# Patient Record
Sex: Female | Born: 1935
Health system: Southern US, Community
[De-identification: ages and names within clinical notes are randomized; demographics above are authoritative.]

## PROBLEM LIST (undated history)

## (undated) DIAGNOSIS — E785 Hyperlipidemia, unspecified: Secondary | ICD-10-CM

## (undated) DIAGNOSIS — I872 Venous insufficiency (chronic) (peripheral): Secondary | ICD-10-CM

## (undated) DIAGNOSIS — H3530 Unspecified macular degeneration: Secondary | ICD-10-CM

## (undated) DIAGNOSIS — H409 Unspecified glaucoma: Secondary | ICD-10-CM

## (undated) DIAGNOSIS — I10 Essential (primary) hypertension: Secondary | ICD-10-CM

## (undated) DIAGNOSIS — G629 Polyneuropathy, unspecified: Secondary | ICD-10-CM

## (undated) DIAGNOSIS — F0390 Unspecified dementia without behavioral disturbance: Secondary | ICD-10-CM

## (undated) HISTORY — PX: EYE SURGERY: SHX253

## (undated) HISTORY — PX: CHOLECYSTECTOMY: SHX55

## (undated) HISTORY — PX: HEMICOLECTOMY: SHX854

---

## 1998-06-05 ENCOUNTER — Inpatient Hospital Stay (HOSPITAL_COMMUNITY): Admission: EM | Admit: 1998-06-05 | Discharge: 1998-06-05 | Payer: Self-pay

## 1998-06-05 ENCOUNTER — Encounter: Payer: Self-pay | Admitting: Urology

## 1998-06-19 ENCOUNTER — Inpatient Hospital Stay (HOSPITAL_COMMUNITY): Admission: EM | Admit: 1998-06-19 | Discharge: 1998-06-20 | Payer: Self-pay | Admitting: Internal Medicine

## 1998-06-19 ENCOUNTER — Encounter: Payer: Self-pay | Admitting: Urology

## 1998-06-20 ENCOUNTER — Encounter: Payer: Self-pay | Admitting: Urology

## 1998-06-27 ENCOUNTER — Encounter: Payer: Self-pay | Admitting: Urology

## 1998-06-27 ENCOUNTER — Ambulatory Visit (HOSPITAL_COMMUNITY): Admission: RE | Admit: 1998-06-27 | Discharge: 1998-06-27 | Payer: Self-pay | Admitting: Urology

## 1998-09-22 ENCOUNTER — Emergency Department (HOSPITAL_COMMUNITY): Admission: EM | Admit: 1998-09-22 | Discharge: 1998-09-22 | Payer: Self-pay

## 1999-08-25 ENCOUNTER — Encounter: Payer: Self-pay | Admitting: Orthopedic Surgery

## 1999-08-25 ENCOUNTER — Encounter: Admission: RE | Admit: 1999-08-25 | Discharge: 1999-08-25 | Payer: Self-pay | Admitting: Orthopedic Surgery

## 1999-08-26 ENCOUNTER — Ambulatory Visit (HOSPITAL_BASED_OUTPATIENT_CLINIC_OR_DEPARTMENT_OTHER): Admission: RE | Admit: 1999-08-26 | Discharge: 1999-08-26 | Payer: Self-pay | Admitting: Orthopedic Surgery

## 1999-11-06 ENCOUNTER — Encounter: Admission: RE | Admit: 1999-11-06 | Discharge: 1999-11-06 | Payer: Self-pay | Admitting: Obstetrics and Gynecology

## 1999-11-06 ENCOUNTER — Encounter: Payer: Self-pay | Admitting: Obstetrics and Gynecology

## 2000-02-23 ENCOUNTER — Ambulatory Visit (HOSPITAL_COMMUNITY): Admission: RE | Admit: 2000-02-23 | Discharge: 2000-02-23 | Payer: Self-pay

## 2002-02-02 ENCOUNTER — Emergency Department (HOSPITAL_COMMUNITY): Admission: EM | Admit: 2002-02-02 | Discharge: 2002-02-02 | Payer: Self-pay | Admitting: Emergency Medicine

## 2002-03-06 ENCOUNTER — Encounter: Payer: Self-pay | Admitting: Emergency Medicine

## 2002-03-06 ENCOUNTER — Inpatient Hospital Stay (HOSPITAL_COMMUNITY): Admission: EM | Admit: 2002-03-06 | Discharge: 2002-03-13 | Payer: Self-pay | Admitting: Emergency Medicine

## 2004-10-13 ENCOUNTER — Encounter: Admission: RE | Admit: 2004-10-13 | Discharge: 2004-10-13 | Payer: Self-pay

## 2005-02-10 ENCOUNTER — Ambulatory Visit (HOSPITAL_COMMUNITY): Admission: RE | Admit: 2005-02-10 | Discharge: 2005-02-10 | Payer: Self-pay | Admitting: Gastroenterology

## 2005-04-20 ENCOUNTER — Encounter: Admission: RE | Admit: 2005-04-20 | Discharge: 2005-04-20 | Payer: Self-pay | Admitting: Surgery

## 2005-05-05 ENCOUNTER — Inpatient Hospital Stay (HOSPITAL_COMMUNITY): Admission: RE | Admit: 2005-05-05 | Discharge: 2005-05-12 | Payer: Self-pay | Admitting: Surgery

## 2005-05-05 ENCOUNTER — Encounter (INDEPENDENT_AMBULATORY_CARE_PROVIDER_SITE_OTHER): Payer: Self-pay | Admitting: Specialist

## 2006-12-06 ENCOUNTER — Encounter: Admission: RE | Admit: 2006-12-06 | Discharge: 2006-12-06 | Payer: Self-pay | Admitting: Internal Medicine

## 2007-07-29 ENCOUNTER — Inpatient Hospital Stay (HOSPITAL_COMMUNITY): Admission: EM | Admit: 2007-07-29 | Discharge: 2007-08-03 | Payer: Self-pay | Admitting: Emergency Medicine

## 2007-07-29 ENCOUNTER — Ambulatory Visit: Payer: Self-pay | Admitting: Cardiology

## 2007-07-29 ENCOUNTER — Encounter (INDEPENDENT_AMBULATORY_CARE_PROVIDER_SITE_OTHER): Payer: Self-pay | Admitting: Internal Medicine

## 2007-08-01 ENCOUNTER — Ambulatory Visit: Payer: Self-pay | Admitting: Physical Medicine & Rehabilitation

## 2009-08-23 ENCOUNTER — Encounter: Admission: RE | Admit: 2009-08-23 | Discharge: 2009-08-23 | Payer: Self-pay | Admitting: Internal Medicine

## 2009-09-09 ENCOUNTER — Encounter: Payer: Self-pay | Admitting: Internal Medicine

## 2009-09-26 ENCOUNTER — Encounter: Admission: RE | Admit: 2009-09-26 | Discharge: 2009-09-26 | Payer: Self-pay | Admitting: Internal Medicine

## 2010-03-22 ENCOUNTER — Encounter: Payer: Self-pay | Admitting: Internal Medicine

## 2010-07-15 NOTE — Discharge Summary (Signed)
NAMEMIRELA, PARSLEY                  ACCOUNT NO.:  [id number]   MEDICAL RECORD NO.:  [id number]          PATIENT TYPE:  INP   LOCATION:  1613                         FACILITY:  [facility]   PHYSICIAN:  Lonia Blood, M.D.       DATE OF BIRTH:  [birth year]   DATE OF ADMISSION:  07/29/2007  DATE OF DISCHARGE:                               DISCHARGE SUMMARY   INTERIM DISCHARGE SUMMARY   DISCHARGE DIAGNOSES:  1. Right humerus fracture.  2. Fracture of the right calcaneus bone with avulsion of the distal      lateral aspect of the calcaneus along the anterior process.  3. Polymyalgia rheumatica on chronic steroids.  4. Peripheral neuropathy of unclear etiology.  5. Hypertension.  6. Status post colonic resection in March 2007 for persistent      diverticulitis.   DISCHARGE MEDICATIONS:  1. Tylenol 100 mg by mouth 3 times a day.  2. Calcium and vitamin D 1 tablet by mouth twice a day.  3. Coreg 3.125 mg by mouth twice a day.  4. Cosopt 1 drop both eyes twice a day.  5. Nu-Iron 160 mg daily.  6. Multivitamin 1 daily.  7. Protonix 40 mg daily.  8. Prednisone 20 mg daily.  9. Altace 10 mg daily.  10.Senokot 1 tablet at bedtime.  11.Vicodin 1-2 tablets as needed every 4 hours for severe pain.  12.Meclizine 12.5 mg by mouth every 8 hours as needed for dizziness.  13.Reglan 5 mg every 6 hours as needed for nausea.   CONDITION ON DISCHARGE:  Mr. Kawahara will be transferred to skilled  nursing facility.   PROCEDURE:  During this admission:  1. Jul 29, 2007, the patient underwent a CT scan of the right shoulder      which was positive for a complex head and neck fracture of the      humerus to the surgical neck region.  2. Jul 29, 2007, CT scan of the right ankle with findings of small      avulsion fracture of the distal lateral calcaneus.   CONSULTATION:  During this admission:  This patient was seen in  consultation by Dr. Turner Daniels from orthopedics.   HISTORY AND PHYSICAL:  Refer to the  dictated H&P done by Dr. Christella Noa on  May 29,009.   HOSPITAL COURSE:  1. Fall and fractures.  Ms. Butler was admitted to [facility].  She was placed in a right upper extremity sling, and the      patient was seen by Dr. Turner Daniels from orthopedic surgery.  Family, the      patient and Dr. Turner Daniels have decided to pursue conservative treatment      for the patient's right femoral fracture.  She will need to follow      up closely with Dr. Turner Daniels in his office to assure that she has got      adequate healing of the arm.  2. Polymyalgia rheumatica on chronic steroids.  We have continued Ms.      Hallum on prednisone on a  daily basis, and we have added a calcium      vitamin D supplement.  She also needs bisphosphatase treatment in      the outpatient setting.  3. Hypertension.  I have resumed the patient's Coreg and ramipril, and      after controlling her pain, she has had a very good response to      this treatment.  4. Pain control.  Ms. Couzens responded very poorly to oral Vicodin and      intravenous Dilaudid.  She became delirious and required switch      back to oral Tylenol.  Tylenol alone seems to be controlling her      pain adequately, and we will continue on this for now.      Lonia Blood, M.D.  Electronically Signed     SL/MEDQ  D:  08/01/2007  T:  08/01/2007  Job:  [id number]   cc:   Feliberto Gottron. Turner Daniels, M.D.  Fax: [phone]   Selena Batten, M.D.

## 2010-07-15 NOTE — H&P (Signed)
Joanne Anderson, Joanne Anderson                  ACCOUNT NO.:  [id number]   MEDICAL RECORD NO.:  [id number]          PATIENT TYPE:  EMS   LOCATION:  ED                           FACILITY:  [facility]   PHYSICIAN:  Herbie Saxon, MDDATE OF BIRTH:  December 24, 1920   DATE OF ADMISSION:  07/29/2007  DATE OF DISCHARGE:                              HISTORY & PHYSICAL   PRIMARY CARE PHYSICIAN:  Dr. Junius Creamer.   ORTHOPEDICS:  Dr. Thomasena Edis.   PRESENTING COMPLAINTS:  Fall 2 hours prior to presentation, pain in the  right shoulder, pain in the right ankle.   HISTORY OF PRESENT ILLNESS:  This is a 77 year old Caucasian lady who  lives at home, who had a fall two hours prior to presenting to the  emergency room at [facility] complaining of severe shoulder  and ankle pain.  The x-ray test showed that she has a proximal right  humeral fracture of the right ankle.  The patient has been experiencing  weakness and dizzy spells at home.  She is on meclizine p.r.n.  She also  has peripheral neuropathy and had difficulty ambulating because of  peripheral neuropathy.  She denies any syncopal episode, any loss of  consciousness.  She has some mild abrasions to the face.  No chest pain.  No palpitation.  No skin rash or pedal swelling.  The patient denies any  dysuria, hematuria or frequency of urination. There is no diarrhea or  constipation.  No cough, shortness of breath or wheezing.   PAST MEDICAL HISTORY:  1. Hypertension for more than 10 years.  2. Peripheral neuropathy more than 5 years.  3. Polymyalgia rheumatica on heparin induced prednisone.   FAMILY HISTORY:  Mother had hypertension.   PAST SURGICAL HISTORY:  1. Colon surgery 2006.  2. Left foot surgery two years ago.  3. Eye surgery times 3.   SOCIAL HISTORY:  She lives alone.  No history of drug, alcohol or  tobacco abuse.   ALLERGIES:  CODEINE.   MEDICATIONS:  1. Prednisone 12 mg daily.  2. Multivitamin one tablet daily.  3. Coreg  3.125 mg b.i.d.  4. Meclizine 12.5 mg q.8 h p.r.n.  5. Ramipril 10 mg daily.   REVIEW OF SYSTEMS:  Pertinent systems all reviewed, pertinent positive  on the history of presenting complaint.   PHYSICAL EXAMINATION:  GENERAL:  She is an elderly lady, not in acute  distress, temperature 98, pulse 86, respirations 16, blood pressure  173/81 .  HEENT:  Pupils equal and reactive to light and accommodation.  Minor  facial abrasion.  Head is normocephalic.  No scar or bruises.  Mucous  membranes moist.  Oropharynx is clear.  NECK:  Supple.  CHEST:  Clinical clear.  HEART:  Sounds 1 and 2 regular rhythm.  ABDOMEN:  Benign.  NEUROLOGICAL:  She is alert and oriented to time, place and person.  EXTREMITIES:  Right upper extremity on right ankle immobilized with  splint.  Peripheral pulses present.  No pedal edema.   LABORATORY DATA:  Labs are pending.  X-ray shows proximal distal  surgical neck fracture of the right proximal humerus.  X-ray of the foot  shows a possible small distal fracture of the navicular.  Soft tissue  swell over the malleolar.   ASSESSMENT:  Severe hypertension, fall, deconditioning, fracture right  humerus, fracture right ankle, polymyalgia rheumatica, peripheral  neuropathy.  The patient is admitted to the medical surgical bed for  orthopedic stability of her fractures.   DISCHARGE MEDICATIONS:  1. Phenergan 12.5 mg IV q.6 h p.r.n.  2. DuoNebs q.6 hours p.r.n.  3. Tylenol 650 mg q.4h p.r.n. back pain.  4. Dilaudid 0.25 mg q.4 h for severe pain.  5. Prednisone 4 mg daily.  6. Multivitamin one tablet daily.  7. Coreg 2.125 mg b.i.d.  8. Neurontin 100 mg p.o. t.i.d.  9. Meclizine 12.5 mg q.8 h p.r.n.  10.Ramipril 10 mg daily.  11.Coreg 3.125 mg b.i.d.   DISCHARGE INSTRUCTIONS:  1. Activity:  Bed rest.  2. Foley catheterization to monitor and put in output.  3. Oxygen supplementation 2-4 liters by nasal cannula.  4. She is to be n.p.o. for possible orthopedic  procedure.  5. Will obtain a 2D echo to obtain the left ventricular ejection      fraction.  6. Obtain a Hemoccult, ESR, chest x-ray, EKG, PT/PTT, INR, urinalysis,      cardiac enzymes x1.  7. SED boots for now until after the orthopedic procedures and      subsequently DVT prophylaxis.  8. PT/OT rehab input,case management input. SNF rehab placement once      orthopedic procedures are done and the patient is medically stable.      Review of the patient's medical care with lab results, Lopressor      2.5 mg IV q.6      h p.r.n. if blood pressure is greater than 160/110.  The patient's      illness, medication and tests were discussed with her and a family      member present at bedside.   TIME OF ENCOUNTER:  Forty five minutes.      Herbie Saxon, MD  Electronically Signed     MIO/MEDQ  D:  07/29/2007  T:  07/29/2007  Job:  [id number]   cc:   Massie Maroon, MD  Fax: [phone]   Erasmo Leventhal, M.D.  Fax: [phone]

## 2010-07-18 NOTE — Op Note (Signed)
[place]  Patient:    Joanne Anderson, Joanne Anderson                           MRN: [id number] Proc. Date: 08/26/99 Adm. Date:  08/26/99 Attending:  Nicki Reaper, M.D. CC:         Nicki Reaper, M.D. (2)                           Operative Report  PREOPERATIVE DIAGNOSIS:  Carpal tunnel syndrome, right hand.  POSTOPERATIVE DIAGNOSIS:  Carpal tunnel syndrome, right hand.  OPERATION:  Decompression right median nerve.  SURGEON:  Nicki Reaper, M.D.  ASSISTANT:  R.N.  ANESTHESIA:  Forearm-based IV regional.  ANESTHESIOLOGIST:  Cliffton Asters. Ivin Booty, M.D.  INDICATIONS:  The patient is a 77 year old female with a history of carpal tunnel syndrome.  EMG nerve conduction is positive, which has not responded to conservative treatment.  DESCRIPTION OF PROCEDURE:  The patient was brought to the operating room where a forearm-based IV regional anesthetic was carried out without difficulty. She was prepped and draped using Betadine scrub and solution with the right arm free.  A longitudinal incision was made in the palm and carried down through the subcutaneous tissue.  Bleeders were electrocauterized.  The palmar fascia was split and superficial palmar arch identified.  The flexor tendon to the ring little finger identified to the ulnar side of the median nerve.  The carpal retinaculum was incised with sharp dissection.  A right-angle and Sewall retractor were placed between the skin and forearm fascia.  The fascia was released for approximately 3 cm proximal to the wrist crease under direct vision.  The canal explored. The area of compression of the median nerve was immediately apparent.  Tenosynovial tissue was moderately thickened.  The wound was irrigated.  The skin was closed with interrupted 5-0 nylon sutures.  A sterile compressive dressing and splint was applied.  The patient tolerated the procedure well and was taken to the recovery room for observation in  satisfactory condition.  She is discharged home to return to the [facility] [place] in one week on Darvocet-N 100 and Keflex. DD:  08/26/99 TD:  08/27/99 Job: [id number]

## 2010-07-18 NOTE — Op Note (Signed)
NAMEZALEY, TALLEY                  ACCOUNT NO.:  [id number]   MEDICAL RECORD NO.:  [id number]          PATIENT TYPE:  INP   LOCATION:  1611                         FACILITY:  [facility]   PHYSICIAN:  Sandria Bales. Ezzard Standing, M.D.  DATE OF BIRTH:  [birth year]   DATE OF PROCEDURE:  05/05/2005  DATE OF DISCHARGE:                                 OPERATIVE REPORT   PREOPERATIVE DIAGNOSIS:  Sigmoid diverticular stricture, secondary  diverticulitis.   POSTOPERATIVE DIAGNOSIS:  Sigmoid diverticular stricture, secondary  diverticulitis.   OPERATION PERFORMED:  Sigmoid colectomy, open.   SURGEON:  Sandria Bales. Ezzard Standing, M.D.   ASSISTANT:  Sharlet Salina T. Hoxworth, M.D.   ANESTHESIA:  General.   ESTIMATED BLOOD LOSS:  300 mL.   DRAINS:  None.   INDICATIONS FOR PROCEDURE:  Ms. Kosar is an 77 year old white female who is  a patient of Dr. Estill Bakes and Dr. Kyra Manges, who underwent a colonoscopy  by Bernette Redbird, M.D., was identified as having a strictured area of her  sigmoid colon, has had recurrent lower abdominal pain felt to be secondary  to diverticulitis. She now comes for attempted sigmoid colectomy.  Because  of her multiple prior abdominal operations, the patient is a poor candidate  for laparoscopic attempt and I felt she was best served by an open  colectomy.   The indications and potential complications of the procedure were explained  to the patient.  Potential complications include but are not limited to  bleeding, infection, leak from the bowel which would result in a colostomy,  ureteral injury and wound problems.   DESCRIPTION OF PROCEDURE:  The patient now comes to the operating room after  having a mechanical antibiotic bowel prep the day before.  She was given 1 g  of Cefoxitin at the initiation of the procedure, had PA stockings in place,  was placed in lithotomy position with Foley catheter and her abdomen was  prepped with Betadine solution and sterilely draped.   I went  through her old lower midline incision where I think they did her  hysterectomy.  I got into the abdominal cavity.  She had fairly extensive  adhesions of the omentum to the anterior abdominal wall which took about 20+  minutes to take down.  Once I got the omentum down, actually there was no  bowel involved at all in these adhesions.   I palpated right and left lobes of the liver, which were unremarkable.  I  palpated the NG tube which was in the stomach and positioned in the correct  place.  I then turned my attention to her pelvis.  She had adhesions of her  left tubo-ovarian complex to an inflamed area of colonic diverticulitis.   I mobilized the sigmoid colon off the left ovary and tube.  I tied where  some little bleeders were on the ovary and tube with either a 2-0 silk  suture ligature or 2-0 Vicryls.  It looked  like she had a fairly discrete  segment of about 12 to 14 cm in length which looked apparently to be both  chronically and acutely inflamed, was very thick and raw.  I was able to get  proximal to this about 4 or 5 cm and distal to this 4  or 5 cm to resect  this segment.  I stayed well up on the mesentery of the sigmoid colon.  I  did not try to identify the ureter but thought I stayed well up on the  transverse colon and did not get into the retroperitoneum where the ureters  usually are.   I divided the bowel proximally and distally, took the mesentery down with 2-  0 silk sutures.  I then carried out an end-to-end anastomosis sewn in place  with a 2-0 silk suture using an interrupted inverting 2-0 silk suture to  carry out the anastomosis.  Then I used an anterior wall Gambee.  After the  anastomosis was complete, and appeared to be airtight, I then placed a  noncrushing clamp about 8 cm proximal to the anastomosis.  Dr. Johna Sheriff went  below, insufflated the colon through an sigmoidoscope and there was no air  leak with the anastomosis placed underwater.  He was not  able to get the  scope up to see the anastomosis which I would figure is probably in the 20  to 25 cm range.   I then did some buttressing sutures around the anastomosis to try to keep  any tension off the anastomosis.  I irrigated the pelvis out with about 2 L  of saline.  I did not feel there was any mesentery to close per se.  I  returned the small bowel to its normal location. I  then brought the omentum  back down, laid that under the incision.  Actually tried to tuck it down  around the anastomosis also.   I then closed the abdomen with two running #1 PDS sutures with a couple of  interrupted PDS sutures in place.  Of note, the patient had a wire suture  closure.  Before, when going into the abdominal cavity, I got into this  wire, but removed all the pieces that I could see.  I then irrigated the  subcutaneous tissues, closed the skin with a skin gun, placed Telfa wicks on  the wound.   The patient tolerated the procedure well and was transported to the recovery  room in good condition.  Sponge and needle counts were correct at the end of  this case.      Sandria Bales. Ezzard Standing, M.D.  Electronically Signed     DHN/MEDQ  D:  05/05/2005  T:  05/06/2005  Job:  [id number]   cc:   Areatha Keas, M.D.  Fax: [phone]   Bernette Redbird, M.D.  Fax: [phone]   S. Kyra Manges, M.D.  Fax: [phone]

## 2010-07-18 NOTE — Discharge Summary (Signed)
Joanne Anderson, Joanne Anderson                            ACCOUNT NO.:  [id number]   MEDICAL RECORD NO.:  [id number]                   PATIENT TYPE:  INP   LOCATION:  0442                                 FACILITY:  [facility]   PHYSICIAN:  Areatha Keas, M.D.                    DATE OF BIRTH:  12-29-1920   DATE OF ADMISSION:  03/06/2002  DATE OF DISCHARGE:  03/13/2002                                 DISCHARGE SUMMARY   FINAL DIAGNOSES:  1. Pneumonia, community acquired, probable bacterial.  2. Giant cell arteritis.  3. Peripheral neuropathy.  4. Increased blood glucose on IV D-5 normal saline.  5. Diabetes mellitus, to be followed as an outpatient.   BRIEF HISTORY:  The patient is a 77 year old white female who has been  basically in good health except for biopsy-positive diagnosis of temporal  arteritis and a mild peripheral neuropathy, who has had 3-4 days of an  illness consisting primarily of mild cough with scant sputum production, but  developed acute fever to 103 on the day of admission with increased  weakness, decreased oral intake, nausea, and some mild emesis without blood,  for which she was brought to the emergency room.  Initial evaluation  suggested a right lower lobe consolidation, probably pneumonia, with a white  count increased to 27,000, mostly polys, and she became briefly confused on  an injection of Phenergan.  It was felt admission was needed to the  hospital, in any case, for treatment of her pneumonia.   HOSPITAL COURSE:  She had a fairly benign hospital course.  She was acutely  ill at admission, but cleared mentally fairly quickly as the Phenergan wore  off, and on oxygen at two liters per minute had a pulse oximetry of 95%.  Her recovery was somewhat slow because of age, but she continued to improve  gradually with improvement in her chest x-ray, decreasing white blood count,  and increase in oxygen saturation.  She had persistent rhonchi and coarse  rales in the right  posterolateral lung, and was eventually converted to p.o.  prednisone instead of increased steroids, coverage with p.o. Tequin rather  than IV Tequin by 03/09/02.  She was still quite weak, but over the weekend  was able to ambulate with a walker with general assistance, and had adequate  oxygen saturations on room air off O2.  A PCO2 of 36, PO2 of 58.5, oxygen  saturation 92%.  It was felt that she was appropriate for discharged on  03/13/02 because of oxygen saturations around 93% on room air, mild soreness,  but no respiratory distress, and increasing muscle strength.  Follow up at  home through family, and nursing follow up was arranged, as well.   LABORATORY DATA:  Her arterial gases were as mentioned before.  White blood  count was 27,000 on admission, hemoglobin 13.3, platelets 261,000.  By the  third hospital day, her white count was around 20,000, having fallen from a  peak of 35,000, with consideration also being given to the fact she was  getting a pulse Solu-Medrol.  Hemoglobin remained stable.  Sedimentation  rate was 42.  Sodium was 135, potassium 3.6 and 4.1 on repeat, chloride 108,  CO2 25.  Blood sugar was normal on admission, and was about as high at 255  with IV sugar running.  BUN 15, creatinine 0.7, calcium 8.6, albumin low at  2.5.  Liver functions were normal.  Urine was unremarkable with no growth.  EKG really showed only a normal sinus rhythm with slight tachycardia, and  was felt to be normal.  Blood cultures were no growth.  Chest x-ray showed a  right lower lobe density which was improving during the hospitalization with  no cardiomegaly.   CONDITION ON DISCHARGE:  Improved.   DIET:  Regular.   ACTIVITY:  Ad lib with home nursing visits for the next two weeks.   FOLLOW UP:  She is to see Dr. Phylliss Bob in one week.   DISCHARGE MEDICATIONS:  1. Tequin 400 mg p.o. to complete a 14-day course.  2. Prednisone 10 mg each morning.  3. Norvasc restarted at 10 mg a  day.  (There were no other medications noted).                                               Areatha Keas, M.D.    WTR/MEDQ  D:  04/10/2002  T:  04/10/2002  Job:  [id number]

## 2010-07-18 NOTE — Discharge Summary (Signed)
Joanne Anderson, Joanne Anderson                  ACCOUNT NO.:  [id number]   MEDICAL RECORD NO.:  [id number]          PATIENT TYPE:  INP   LOCATION:  1611                         FACILITY:  [facility]   PHYSICIAN:  Sandria Bales. Ezzard Standing, M.D.  DATE OF BIRTH:  08-21-1920   DATE OF ADMISSION:  05/05/2005  DATE OF DISCHARGE:  05/12/2005                                 DISCHARGE SUMMARY   DISCHARGE DIAGNOSES:  1.  Sigmoid colon diverticulosis, diverticulitis.  2.  Mildly elevated blood glucose's postop presumably secondary to stress      and steroids.  3.  On chronic prednisone for polymyalgia rheumatica.  4.  Hypertension.  5.  History of glaucoma.  6.  Peripheral neuropathy involving both feet.  7.  Hiatal hernia seen on CT scan but really minimally symptomatic.   OPERATION:  The patient had an open sigmoid colectomy on May 05, 2005.   HISTORY OF PRESENT ILLNESS:  Joanne Anderson is an 77 year old white female who  sees Dr. Estill Bakes and Dr. Kyra Manges who has had chronic lower abdominal  pain which has gotten steadily worse over the last year. She has been  treated with antibiotics for diverticular disease a couple of times over the  last year. A CT scan in September 2005 showed a severely thickened wall in  the sigmoid colon consistent with diverticulosis/diverticulitis. She saw Dr.  Nadine Counts Buccini who did a colonoscopy but had to use a pediatric scope and  noticed a narrowed sigmoid colon and then saw me for consideration for  sigmoid colectomy.   The indications and potential complications of surgery and hospitalization  were explained to the patient. In addition to her diverticular disease, she  has a hiatal hernia seen on CT scan, she is treated with hypertension. She  has polymyalgia rheumatica with giant cell arteritis. She has been on long  term prednisone for this.   She has glaucoma.   She has a peripheral neuropathy involving both her feet which is really not  clear why she has this by my  history.   The patient completed a mechanical antibiotic bowel prep before coming to  the hospital.   HOSPITAL COURSE:  On the day of admission, Joanne Anderson underwent an open  sigmoid colectomy.   Her postoperative course she did well. She had an NG tube which was left in  for 2 days. She was bolused with hydrocortisone during her perioperative  stay. Her blood pressure medicines were held but her blood pressure remained  stable.   On the first postoperative day, her hemoglobin was 10, hematocrit 32, white  blood count of 17,600. Her sodium was 139, potassium 4.6, chloride of 106,  CO2 of 28, BUN of 5, creatinine of 0.8. By the third postoperative day, she  was doing well enough and had a small bowel movement and I removed her NG  tube. She was started on clear liquids and advanced to a regular diet. She  has now been tolerating a regular diet for over a day. Her last blood  pressure shows 138/70, her pulse is 70, her  last blood sugar was 128 and she  is ready for discharge. She will check with Dr. Phylliss Bob for her blood sugars.   Because of her steroids and will leave her staples in, she will see me or  somebody in my office back in one weeks time. She will be given Darvocet-N  100 for pain, she will continue her prednisone, hold her Altace because her  pressure has been good here and I am not really sure how well she is eating  yet. She knows to call for interval problem. There are no limits to her  activity.      Sandria Bales. Ezzard Standing, M.D.  Electronically Signed     DHN/MEDQ  D:  05/12/2005  T:  05/13/2005  Job:  [id number]   cc:   Areatha Keas, M.D.  Fax: [phone]   S. Kyra Manges, M.D.  Fax: [phone]   Bernette Redbird, M.D.  Fax: [phone]   Jaclyn Prime. Lucas Mallow, M.D.  Fax: [phone]

## 2010-07-18 NOTE — Op Note (Signed)
NAMELANEA, Joanne Anderson                  ACCOUNT NO.:  [id number]   MEDICAL RECORD NO.:  [id number]          PATIENT TYPE:  AMB   LOCATION:  ENDO                         FACILITY:  [facility]   PHYSICIAN:  Bernette Redbird, M.D.   DATE OF BIRTH:  Feb 13, 1921   DATE OF PROCEDURE:  02/10/2005  DATE OF DISCHARGE:                                 OPERATIVE REPORT   PROCEDURE:  Colonoscopy.   INDICATIONS:  An 77 year old female with recurrent left lower quadrant pain  and diverticulitis, with barium enema showing a stricture in that area, rule  out malignancy.   FINDINGS:  Benign sigmoid stricturing with severe sigmoid diverticulosis,  otherwise normal to proximal colon.  Unable to reach cecum.   PROCEDURE:  The nature, purpose and risks of the procedure had been  discussed with the patient, who provided written consent.  Sedation was  fentanyl 50 mcg and Versed 4 mg IV without arrhythmias or desaturation.  She  also received Phenergan 12.5 mg IV because of a history of nausea in  association with anesthesia.   The procedure was initiated with the Olympus pediatric video colonoscope but  this could not make it through the sigmoid strictured region,  so it was  pulled back, retroflexion was performed and was normal, and the patient was  re-colonoscoped using the Olympus pediatric upper endoscope.  This was able  to get through the strictured area in the sigmoid region with just mild  resistance and from there it was able to be advanced to the entire length of  the scope, felt to correspond to the mid-ascending based on  transillumination.  Pullback was then performed.  The quality of prep was  very good, and it is felt that all areas were well-seen.   Careful attention was paid to the area of stricturing in the sigmoid region.  There was one area in particular that was a collar-like short area of  narrowing measuring perhaps 1 or 2 cm in length, with generalized narrowing  proximal and distal to  that area.  Careful inspection, however, demonstrated  no evidence of malignancy or mass.  There was one area of erythematous  mucosa thought to be an inverted diverticulum, and it was not biopsied.   The diverticular disease was quite severe in the sigmoid region but not  present elsewhere.  Retroflexion in the rectum was unremarkable.   The patient tolerated the procedure well, and there were no apparent  complications.   IMPRESSION:  Severe diverticular stricturing and severe sigmoid  diverticulosis as noted above, otherwise normal to the proximal colon.  No  evidence of malignancy.   PLAN:  I would favor elective surgical consultation for consideration of a  possible sigmoid colectomy because of this patient's recurring symptoms.           ______________________________  Bernette Redbird, M.D.     RB/MEDQ  D:  02/10/2005  T:  02/11/2005  Job:  [id number]   cc:   S. Kyra Manges, M.D.  Fax: [phone]

## 2010-11-26 LAB — CBC
HCT: 29.6 — ABNORMAL LOW
HCT: 33.9 — ABNORMAL LOW
HCT: 37.7
Hemoglobin: 12.9
MCHC: 34.2
MCHC: 34.2
MCV: 90.1
MCV: 93.3
Platelets: 191
Platelets: 227
Platelets: 270
RBC: 3.17 — ABNORMAL LOW
RBC: 3.79 — ABNORMAL LOW
RDW: 13
RDW: 13.4
WBC: 10.6 — ABNORMAL HIGH
WBC: 11.9 — ABNORMAL HIGH

## 2010-11-26 LAB — COMPREHENSIVE METABOLIC PANEL
AST: 26
Albumin: 3.3 — ABNORMAL LOW
BUN: 11
CO2: 26
Calcium: 9.4
Glucose, Bld: 150 — ABNORMAL HIGH

## 2010-11-26 LAB — DIFFERENTIAL
Basophils Absolute: 0
Basophils Relative: 0
Eosinophils Absolute: 0
Lymphocytes Relative: 17
Monocytes Relative: 5
Neutro Abs: 9.3 — ABNORMAL HIGH

## 2010-11-26 LAB — SEDIMENTATION RATE: Sed Rate: 25 — ABNORMAL HIGH

## 2010-11-26 LAB — URINALYSIS, ROUTINE W REFLEX MICROSCOPIC
Bilirubin Urine: NEGATIVE
Hgb urine dipstick: NEGATIVE
Ketones, ur: NEGATIVE
Protein, ur: NEGATIVE
Urobilinogen, UA: 0.2
pH: 7

## 2010-11-26 LAB — APTT: aPTT: 36

## 2010-11-26 LAB — BASIC METABOLIC PANEL: Potassium: 3.5

## 2010-11-26 LAB — URINE CULTURE: Colony Count: 3000

## 2010-11-26 LAB — CK TOTAL AND CKMB (NOT AT ARMC): CK, MB: 2.9

## 2010-11-27 LAB — CBC
HCT: 32.9 — ABNORMAL LOW
Hemoglobin: 11.3 — ABNORMAL LOW
MCHC: 34.3
Platelets: 255
RDW: 13.4
WBC: 10.4

## 2011-03-09 DIAGNOSIS — H409 Unspecified glaucoma: Secondary | ICD-10-CM | POA: Diagnosis not present

## 2011-03-09 DIAGNOSIS — H353290 Exudative age-related macular degeneration, unspecified eye, stage unspecified: Secondary | ICD-10-CM | POA: Diagnosis not present

## 2011-03-09 DIAGNOSIS — H4011X0 Primary open-angle glaucoma, stage unspecified: Secondary | ICD-10-CM | POA: Diagnosis not present

## 2011-03-12 DIAGNOSIS — H4011X0 Primary open-angle glaucoma, stage unspecified: Secondary | ICD-10-CM | POA: Diagnosis not present

## 2011-03-12 DIAGNOSIS — H35729 Serous detachment of retinal pigment epithelium, unspecified eye: Secondary | ICD-10-CM | POA: Diagnosis not present

## 2011-03-12 DIAGNOSIS — H409 Unspecified glaucoma: Secondary | ICD-10-CM | POA: Diagnosis not present

## 2011-03-12 DIAGNOSIS — Z961 Presence of intraocular lens: Secondary | ICD-10-CM | POA: Diagnosis not present

## 2011-03-12 DIAGNOSIS — H353290 Exudative age-related macular degeneration, unspecified eye, stage unspecified: Secondary | ICD-10-CM | POA: Diagnosis not present

## 2011-04-16 DIAGNOSIS — H35729 Serous detachment of retinal pigment epithelium, unspecified eye: Secondary | ICD-10-CM | POA: Diagnosis not present

## 2011-04-16 DIAGNOSIS — H353290 Exudative age-related macular degeneration, unspecified eye, stage unspecified: Secondary | ICD-10-CM | POA: Diagnosis not present

## 2011-04-16 DIAGNOSIS — H4011X0 Primary open-angle glaucoma, stage unspecified: Secondary | ICD-10-CM | POA: Diagnosis not present

## 2011-04-16 DIAGNOSIS — Z961 Presence of intraocular lens: Secondary | ICD-10-CM | POA: Diagnosis not present

## 2011-05-14 DIAGNOSIS — E7800 Pure hypercholesterolemia, unspecified: Secondary | ICD-10-CM | POA: Diagnosis not present

## 2011-05-14 DIAGNOSIS — R7309 Other abnormal glucose: Secondary | ICD-10-CM | POA: Diagnosis not present

## 2011-05-14 DIAGNOSIS — I10 Essential (primary) hypertension: Secondary | ICD-10-CM | POA: Diagnosis not present

## 2011-05-19 DIAGNOSIS — I10 Essential (primary) hypertension: Secondary | ICD-10-CM | POA: Diagnosis not present

## 2011-05-19 DIAGNOSIS — E785 Hyperlipidemia, unspecified: Secondary | ICD-10-CM | POA: Diagnosis not present

## 2011-05-19 DIAGNOSIS — R7309 Other abnormal glucose: Secondary | ICD-10-CM | POA: Diagnosis not present

## 2011-05-19 DIAGNOSIS — M353 Polymyalgia rheumatica: Secondary | ICD-10-CM | POA: Diagnosis not present

## 2011-05-28 DIAGNOSIS — R5381 Other malaise: Secondary | ICD-10-CM | POA: Diagnosis not present

## 2011-05-28 DIAGNOSIS — M353 Polymyalgia rheumatica: Secondary | ICD-10-CM | POA: Diagnosis not present

## 2011-05-28 DIAGNOSIS — R110 Nausea: Secondary | ICD-10-CM | POA: Diagnosis not present

## 2011-06-08 DIAGNOSIS — H353290 Exudative age-related macular degeneration, unspecified eye, stage unspecified: Secondary | ICD-10-CM | POA: Diagnosis not present

## 2011-06-08 DIAGNOSIS — H4011X0 Primary open-angle glaucoma, stage unspecified: Secondary | ICD-10-CM | POA: Diagnosis not present

## 2011-06-11 DIAGNOSIS — H353290 Exudative age-related macular degeneration, unspecified eye, stage unspecified: Secondary | ICD-10-CM | POA: Diagnosis not present

## 2011-06-11 DIAGNOSIS — H4011X0 Primary open-angle glaucoma, stage unspecified: Secondary | ICD-10-CM | POA: Diagnosis not present

## 2011-06-11 DIAGNOSIS — Z961 Presence of intraocular lens: Secondary | ICD-10-CM | POA: Diagnosis not present

## 2011-06-11 DIAGNOSIS — H35729 Serous detachment of retinal pigment epithelium, unspecified eye: Secondary | ICD-10-CM | POA: Diagnosis not present

## 2011-08-06 DIAGNOSIS — H4011X0 Primary open-angle glaucoma, stage unspecified: Secondary | ICD-10-CM | POA: Diagnosis not present

## 2011-08-06 DIAGNOSIS — Z961 Presence of intraocular lens: Secondary | ICD-10-CM | POA: Diagnosis not present

## 2011-08-06 DIAGNOSIS — H353290 Exudative age-related macular degeneration, unspecified eye, stage unspecified: Secondary | ICD-10-CM | POA: Diagnosis not present

## 2011-08-06 DIAGNOSIS — H35729 Serous detachment of retinal pigment epithelium, unspecified eye: Secondary | ICD-10-CM | POA: Diagnosis not present

## 2011-08-06 DIAGNOSIS — H409 Unspecified glaucoma: Secondary | ICD-10-CM | POA: Diagnosis not present

## 2011-08-13 DIAGNOSIS — R7309 Other abnormal glucose: Secondary | ICD-10-CM | POA: Diagnosis not present

## 2011-08-19 DIAGNOSIS — E1365 Other specified diabetes mellitus with hyperglycemia: Secondary | ICD-10-CM | POA: Diagnosis not present

## 2011-08-19 DIAGNOSIS — E7800 Pure hypercholesterolemia, unspecified: Secondary | ICD-10-CM | POA: Diagnosis not present

## 2011-08-19 DIAGNOSIS — I10 Essential (primary) hypertension: Secondary | ICD-10-CM | POA: Diagnosis not present

## 2011-10-08 DIAGNOSIS — H353290 Exudative age-related macular degeneration, unspecified eye, stage unspecified: Secondary | ICD-10-CM | POA: Diagnosis not present

## 2011-10-08 DIAGNOSIS — H4011X0 Primary open-angle glaucoma, stage unspecified: Secondary | ICD-10-CM | POA: Diagnosis not present

## 2011-10-08 DIAGNOSIS — H35729 Serous detachment of retinal pigment epithelium, unspecified eye: Secondary | ICD-10-CM | POA: Diagnosis not present

## 2011-10-08 DIAGNOSIS — H409 Unspecified glaucoma: Secondary | ICD-10-CM | POA: Diagnosis not present

## 2011-10-08 DIAGNOSIS — Z961 Presence of intraocular lens: Secondary | ICD-10-CM | POA: Diagnosis not present

## 2011-10-15 DIAGNOSIS — H409 Unspecified glaucoma: Secondary | ICD-10-CM | POA: Diagnosis not present

## 2011-10-15 DIAGNOSIS — H353290 Exudative age-related macular degeneration, unspecified eye, stage unspecified: Secondary | ICD-10-CM | POA: Diagnosis not present

## 2011-10-15 DIAGNOSIS — H4011X0 Primary open-angle glaucoma, stage unspecified: Secondary | ICD-10-CM | POA: Diagnosis not present

## 2011-10-27 DIAGNOSIS — H4011X0 Primary open-angle glaucoma, stage unspecified: Secondary | ICD-10-CM | POA: Diagnosis not present

## 2011-10-27 DIAGNOSIS — H353290 Exudative age-related macular degeneration, unspecified eye, stage unspecified: Secondary | ICD-10-CM | POA: Diagnosis not present

## 2011-11-12 DIAGNOSIS — H4011X0 Primary open-angle glaucoma, stage unspecified: Secondary | ICD-10-CM | POA: Diagnosis not present

## 2011-11-12 DIAGNOSIS — H353290 Exudative age-related macular degeneration, unspecified eye, stage unspecified: Secondary | ICD-10-CM | POA: Diagnosis not present

## 2011-11-26 DIAGNOSIS — H4011X0 Primary open-angle glaucoma, stage unspecified: Secondary | ICD-10-CM | POA: Diagnosis not present

## 2011-11-26 DIAGNOSIS — H353290 Exudative age-related macular degeneration, unspecified eye, stage unspecified: Secondary | ICD-10-CM | POA: Diagnosis not present

## 2011-12-14 DIAGNOSIS — E1365 Other specified diabetes mellitus with hyperglycemia: Secondary | ICD-10-CM | POA: Diagnosis not present

## 2011-12-14 DIAGNOSIS — I10 Essential (primary) hypertension: Secondary | ICD-10-CM | POA: Diagnosis not present

## 2011-12-21 DIAGNOSIS — M549 Dorsalgia, unspecified: Secondary | ICD-10-CM | POA: Diagnosis not present

## 2011-12-21 DIAGNOSIS — M5137 Other intervertebral disc degeneration, lumbosacral region: Secondary | ICD-10-CM | POA: Diagnosis not present

## 2011-12-21 DIAGNOSIS — M47817 Spondylosis without myelopathy or radiculopathy, lumbosacral region: Secondary | ICD-10-CM | POA: Diagnosis not present

## 2011-12-22 ENCOUNTER — Other Ambulatory Visit: Payer: Self-pay | Admitting: Internal Medicine

## 2011-12-22 DIAGNOSIS — M549 Dorsalgia, unspecified: Secondary | ICD-10-CM

## 2011-12-29 ENCOUNTER — Other Ambulatory Visit: Payer: Self-pay

## 2011-12-31 DIAGNOSIS — Z961 Presence of intraocular lens: Secondary | ICD-10-CM | POA: Diagnosis not present

## 2011-12-31 DIAGNOSIS — H4011X0 Primary open-angle glaucoma, stage unspecified: Secondary | ICD-10-CM | POA: Diagnosis not present

## 2011-12-31 DIAGNOSIS — H35729 Serous detachment of retinal pigment epithelium, unspecified eye: Secondary | ICD-10-CM | POA: Diagnosis not present

## 2011-12-31 DIAGNOSIS — H353290 Exudative age-related macular degeneration, unspecified eye, stage unspecified: Secondary | ICD-10-CM | POA: Diagnosis not present

## 2012-01-01 DIAGNOSIS — R079 Chest pain, unspecified: Secondary | ICD-10-CM | POA: Diagnosis not present

## 2012-01-18 DIAGNOSIS — R109 Unspecified abdominal pain: Secondary | ICD-10-CM | POA: Diagnosis not present

## 2012-01-18 DIAGNOSIS — R079 Chest pain, unspecified: Secondary | ICD-10-CM | POA: Diagnosis not present

## 2012-01-19 ENCOUNTER — Other Ambulatory Visit: Payer: Self-pay | Admitting: Internal Medicine

## 2012-01-19 DIAGNOSIS — R109 Unspecified abdominal pain: Secondary | ICD-10-CM

## 2012-01-22 ENCOUNTER — Ambulatory Visit
Admission: RE | Admit: 2012-01-22 | Discharge: 2012-01-22 | Disposition: A | Discharge status: Home / Self Care | Payer: Medicare Other | Source: Ambulatory Visit | Attending: Internal Medicine | Admitting: Internal Medicine

## 2012-01-22 DIAGNOSIS — N2889 Other specified disorders of kidney and ureter: Secondary | ICD-10-CM | POA: Diagnosis not present

## 2012-01-22 DIAGNOSIS — R109 Unspecified abdominal pain: Secondary | ICD-10-CM

## 2012-01-22 DIAGNOSIS — K439 Ventral hernia without obstruction or gangrene: Secondary | ICD-10-CM | POA: Diagnosis not present

## 2012-01-22 DIAGNOSIS — K4090 Unilateral inguinal hernia, without obstruction or gangrene, not specified as recurrent: Secondary | ICD-10-CM | POA: Diagnosis not present

## 2012-01-22 MED ORDER — IOHEXOL 300 MG/ML  SOLN
100.0000 mL | Freq: Once | INTRAMUSCULAR | Status: AC | PRN
  Administered 2012-01-22: 100 mL via INTRAVENOUS

## 2012-01-25 DIAGNOSIS — H353290 Exudative age-related macular degeneration, unspecified eye, stage unspecified: Secondary | ICD-10-CM | POA: Diagnosis not present

## 2012-01-25 DIAGNOSIS — H4011X0 Primary open-angle glaucoma, stage unspecified: Secondary | ICD-10-CM | POA: Diagnosis not present

## 2012-02-04 DIAGNOSIS — H35729 Serous detachment of retinal pigment epithelium, unspecified eye: Secondary | ICD-10-CM | POA: Diagnosis not present

## 2012-02-04 DIAGNOSIS — Z961 Presence of intraocular lens: Secondary | ICD-10-CM | POA: Diagnosis not present

## 2012-02-04 DIAGNOSIS — H4011X0 Primary open-angle glaucoma, stage unspecified: Secondary | ICD-10-CM | POA: Diagnosis not present

## 2012-02-04 DIAGNOSIS — H353290 Exudative age-related macular degeneration, unspecified eye, stage unspecified: Secondary | ICD-10-CM | POA: Diagnosis not present

## 2012-02-08 DIAGNOSIS — N289 Disorder of kidney and ureter, unspecified: Secondary | ICD-10-CM | POA: Diagnosis not present

## 2012-02-08 DIAGNOSIS — I10 Essential (primary) hypertension: Secondary | ICD-10-CM | POA: Diagnosis not present

## 2012-02-08 DIAGNOSIS — R7309 Other abnormal glucose: Secondary | ICD-10-CM | POA: Diagnosis not present

## 2012-02-08 DIAGNOSIS — R079 Chest pain, unspecified: Secondary | ICD-10-CM | POA: Diagnosis not present

## 2012-02-11 DIAGNOSIS — H353290 Exudative age-related macular degeneration, unspecified eye, stage unspecified: Secondary | ICD-10-CM | POA: Diagnosis not present

## 2012-02-11 DIAGNOSIS — H4011X0 Primary open-angle glaucoma, stage unspecified: Secondary | ICD-10-CM | POA: Diagnosis not present

## 2012-04-28 DIAGNOSIS — H353290 Exudative age-related macular degeneration, unspecified eye, stage unspecified: Secondary | ICD-10-CM | POA: Diagnosis not present

## 2012-05-09 DIAGNOSIS — R7309 Other abnormal glucose: Secondary | ICD-10-CM | POA: Diagnosis not present

## 2012-05-09 DIAGNOSIS — I10 Essential (primary) hypertension: Secondary | ICD-10-CM | POA: Diagnosis not present

## 2012-05-12 DIAGNOSIS — H353290 Exudative age-related macular degeneration, unspecified eye, stage unspecified: Secondary | ICD-10-CM | POA: Diagnosis not present

## 2012-05-13 DIAGNOSIS — R109 Unspecified abdominal pain: Secondary | ICD-10-CM | POA: Diagnosis not present

## 2012-05-13 DIAGNOSIS — R443 Hallucinations, unspecified: Secondary | ICD-10-CM | POA: Diagnosis not present

## 2012-05-13 DIAGNOSIS — R21 Rash and other nonspecific skin eruption: Secondary | ICD-10-CM | POA: Diagnosis not present

## 2012-05-13 DIAGNOSIS — I10 Essential (primary) hypertension: Secondary | ICD-10-CM | POA: Diagnosis not present

## 2012-05-16 ENCOUNTER — Other Ambulatory Visit: Payer: Self-pay | Admitting: Internal Medicine

## 2012-05-16 DIAGNOSIS — R443 Hallucinations, unspecified: Secondary | ICD-10-CM

## 2012-05-25 DIAGNOSIS — H353290 Exudative age-related macular degeneration, unspecified eye, stage unspecified: Secondary | ICD-10-CM | POA: Diagnosis not present

## 2012-05-25 DIAGNOSIS — H4011X0 Primary open-angle glaucoma, stage unspecified: Secondary | ICD-10-CM | POA: Diagnosis not present

## 2012-05-30 DIAGNOSIS — N289 Disorder of kidney and ureter, unspecified: Secondary | ICD-10-CM | POA: Diagnosis not present

## 2012-05-30 DIAGNOSIS — R7309 Other abnormal glucose: Secondary | ICD-10-CM | POA: Diagnosis not present

## 2012-05-30 DIAGNOSIS — R21 Rash and other nonspecific skin eruption: Secondary | ICD-10-CM | POA: Diagnosis not present

## 2012-05-30 DIAGNOSIS — I10 Essential (primary) hypertension: Secondary | ICD-10-CM | POA: Diagnosis not present

## 2012-06-09 DIAGNOSIS — D4959 Neoplasm of unspecified behavior of other genitourinary organ: Secondary | ICD-10-CM | POA: Diagnosis not present

## 2012-06-16 DIAGNOSIS — H353290 Exudative age-related macular degeneration, unspecified eye, stage unspecified: Secondary | ICD-10-CM | POA: Diagnosis not present

## 2012-07-07 DIAGNOSIS — H353290 Exudative age-related macular degeneration, unspecified eye, stage unspecified: Secondary | ICD-10-CM | POA: Diagnosis not present

## 2012-08-11 DIAGNOSIS — H353290 Exudative age-related macular degeneration, unspecified eye, stage unspecified: Secondary | ICD-10-CM | POA: Diagnosis not present

## 2012-09-12 DIAGNOSIS — I10 Essential (primary) hypertension: Secondary | ICD-10-CM | POA: Diagnosis not present

## 2012-09-19 DIAGNOSIS — I10 Essential (primary) hypertension: Secondary | ICD-10-CM | POA: Diagnosis not present

## 2012-09-19 DIAGNOSIS — N289 Disorder of kidney and ureter, unspecified: Secondary | ICD-10-CM | POA: Diagnosis not present

## 2012-09-19 DIAGNOSIS — R7309 Other abnormal glucose: Secondary | ICD-10-CM | POA: Diagnosis not present

## 2012-09-19 DIAGNOSIS — R079 Chest pain, unspecified: Secondary | ICD-10-CM | POA: Diagnosis not present

## 2012-10-13 DIAGNOSIS — H35729 Serous detachment of retinal pigment epithelium, unspecified eye: Secondary | ICD-10-CM | POA: Diagnosis not present

## 2012-10-13 DIAGNOSIS — H353290 Exudative age-related macular degeneration, unspecified eye, stage unspecified: Secondary | ICD-10-CM | POA: Diagnosis not present

## 2012-10-13 DIAGNOSIS — Z961 Presence of intraocular lens: Secondary | ICD-10-CM | POA: Diagnosis not present

## 2012-10-17 DIAGNOSIS — H353290 Exudative age-related macular degeneration, unspecified eye, stage unspecified: Secondary | ICD-10-CM | POA: Diagnosis not present

## 2012-10-17 DIAGNOSIS — H4011X0 Primary open-angle glaucoma, stage unspecified: Secondary | ICD-10-CM | POA: Diagnosis not present

## 2012-12-08 DIAGNOSIS — H353290 Exudative age-related macular degeneration, unspecified eye, stage unspecified: Secondary | ICD-10-CM | POA: Diagnosis not present

## 2012-12-08 DIAGNOSIS — Z961 Presence of intraocular lens: Secondary | ICD-10-CM | POA: Diagnosis not present

## 2012-12-08 DIAGNOSIS — H35729 Serous detachment of retinal pigment epithelium, unspecified eye: Secondary | ICD-10-CM | POA: Diagnosis not present

## 2013-01-05 DIAGNOSIS — Z961 Presence of intraocular lens: Secondary | ICD-10-CM | POA: Diagnosis not present

## 2013-01-05 DIAGNOSIS — H35729 Serous detachment of retinal pigment epithelium, unspecified eye: Secondary | ICD-10-CM | POA: Diagnosis not present

## 2013-01-05 DIAGNOSIS — H353290 Exudative age-related macular degeneration, unspecified eye, stage unspecified: Secondary | ICD-10-CM | POA: Diagnosis not present

## 2013-01-09 DIAGNOSIS — R7309 Other abnormal glucose: Secondary | ICD-10-CM | POA: Diagnosis not present

## 2013-01-09 DIAGNOSIS — I10 Essential (primary) hypertension: Secondary | ICD-10-CM | POA: Diagnosis not present

## 2013-01-16 DIAGNOSIS — I10 Essential (primary) hypertension: Secondary | ICD-10-CM | POA: Diagnosis not present

## 2013-01-16 DIAGNOSIS — N289 Disorder of kidney and ureter, unspecified: Secondary | ICD-10-CM | POA: Diagnosis not present

## 2013-01-16 DIAGNOSIS — E7800 Pure hypercholesterolemia, unspecified: Secondary | ICD-10-CM | POA: Diagnosis not present

## 2013-01-16 DIAGNOSIS — K219 Gastro-esophageal reflux disease without esophagitis: Secondary | ICD-10-CM | POA: Diagnosis not present

## 2013-02-02 DIAGNOSIS — Z961 Presence of intraocular lens: Secondary | ICD-10-CM | POA: Diagnosis not present

## 2013-02-02 DIAGNOSIS — H35729 Serous detachment of retinal pigment epithelium, unspecified eye: Secondary | ICD-10-CM | POA: Diagnosis not present

## 2013-02-02 DIAGNOSIS — H353290 Exudative age-related macular degeneration, unspecified eye, stage unspecified: Secondary | ICD-10-CM | POA: Diagnosis not present

## 2013-04-06 DIAGNOSIS — H35729 Serous detachment of retinal pigment epithelium, unspecified eye: Secondary | ICD-10-CM | POA: Diagnosis not present

## 2013-04-06 DIAGNOSIS — H353290 Exudative age-related macular degeneration, unspecified eye, stage unspecified: Secondary | ICD-10-CM | POA: Diagnosis not present

## 2013-04-06 DIAGNOSIS — Z961 Presence of intraocular lens: Secondary | ICD-10-CM | POA: Diagnosis not present

## 2013-04-14 DIAGNOSIS — D4959 Neoplasm of unspecified behavior of other genitourinary organ: Secondary | ICD-10-CM | POA: Diagnosis not present

## 2013-04-17 DIAGNOSIS — E7800 Pure hypercholesterolemia, unspecified: Secondary | ICD-10-CM | POA: Diagnosis not present

## 2013-04-17 DIAGNOSIS — I10 Essential (primary) hypertension: Secondary | ICD-10-CM | POA: Diagnosis not present

## 2013-04-17 DIAGNOSIS — N289 Disorder of kidney and ureter, unspecified: Secondary | ICD-10-CM | POA: Diagnosis not present

## 2013-04-17 DIAGNOSIS — R7309 Other abnormal glucose: Secondary | ICD-10-CM | POA: Diagnosis not present

## 2013-04-19 ENCOUNTER — Other Ambulatory Visit: Payer: Self-pay | Admitting: Internal Medicine

## 2013-04-19 DIAGNOSIS — N2889 Other specified disorders of kidney and ureter: Secondary | ICD-10-CM

## 2013-05-08 DIAGNOSIS — H409 Unspecified glaucoma: Secondary | ICD-10-CM | POA: Diagnosis not present

## 2013-05-08 DIAGNOSIS — H4011X0 Primary open-angle glaucoma, stage unspecified: Secondary | ICD-10-CM | POA: Diagnosis not present

## 2013-05-31 ENCOUNTER — Encounter (HOSPITAL_COMMUNITY): Payer: Self-pay | Admitting: Emergency Medicine

## 2013-05-31 ENCOUNTER — Emergency Department (HOSPITAL_COMMUNITY): Payer: Medicare Other

## 2013-05-31 ENCOUNTER — Emergency Department (HOSPITAL_COMMUNITY)
Admission: EM | Admit: 2013-05-31 | Discharge: 2013-06-01 | Disposition: A | Discharge status: Home / Self Care | Payer: Medicare Other | Attending: Emergency Medicine | Admitting: Emergency Medicine

## 2013-05-31 DIAGNOSIS — Z79899 Other long term (current) drug therapy: Secondary | ICD-10-CM | POA: Insufficient documentation

## 2013-05-31 DIAGNOSIS — Z8669 Personal history of other diseases of the nervous system and sense organs: Secondary | ICD-10-CM | POA: Diagnosis not present

## 2013-05-31 DIAGNOSIS — E785 Hyperlipidemia, unspecified: Secondary | ICD-10-CM | POA: Insufficient documentation

## 2013-05-31 DIAGNOSIS — M545 Low back pain, unspecified: Secondary | ICD-10-CM | POA: Diagnosis not present

## 2013-05-31 DIAGNOSIS — IMO0002 Reserved for concepts with insufficient information to code with codable children: Secondary | ICD-10-CM | POA: Diagnosis not present

## 2013-05-31 DIAGNOSIS — I10 Essential (primary) hypertension: Secondary | ICD-10-CM | POA: Insufficient documentation

## 2013-05-31 DIAGNOSIS — M8448XA Pathological fracture, other site, initial encounter for fracture: Secondary | ICD-10-CM | POA: Diagnosis not present

## 2013-05-31 DIAGNOSIS — G8929 Other chronic pain: Secondary | ICD-10-CM | POA: Diagnosis not present

## 2013-05-31 DIAGNOSIS — S32009A Unspecified fracture of unspecified lumbar vertebra, initial encounter for closed fracture: Secondary | ICD-10-CM | POA: Insufficient documentation

## 2013-05-31 DIAGNOSIS — M549 Dorsalgia, unspecified: Secondary | ICD-10-CM

## 2013-05-31 DIAGNOSIS — X58XXXA Exposure to other specified factors, initial encounter: Secondary | ICD-10-CM | POA: Insufficient documentation

## 2013-05-31 DIAGNOSIS — Y929 Unspecified place or not applicable: Secondary | ICD-10-CM | POA: Insufficient documentation

## 2013-05-31 DIAGNOSIS — Y939 Activity, unspecified: Secondary | ICD-10-CM | POA: Insufficient documentation

## 2013-05-31 HISTORY — DX: Polyneuropathy, unspecified: G62.9

## 2013-05-31 HISTORY — DX: Essential (primary) hypertension: I10

## 2013-05-31 HISTORY — DX: Hyperlipidemia, unspecified: E78.5

## 2013-05-31 MED ORDER — FENTANYL CITRATE 0.05 MG/ML IJ SOLN
12.5000 ug | INTRAMUSCULAR | Status: DC | PRN
  Administered 2013-06-01 (×2): 12.5 ug via INTRAVENOUS
  Filled 2013-05-31 (×2): qty 2

## 2013-05-31 MED ORDER — ONDANSETRON HCL 4 MG/2ML IJ SOLN
4.0000 mg | Freq: Once | INTRAMUSCULAR | Status: AC
Start: 2013-05-31 — End: 2013-06-01
  Administered 2013-06-01: 4 mg via INTRAVENOUS
  Filled 2013-05-31: qty 2

## 2013-05-31 MED ORDER — SODIUM CHLORIDE 0.9 % IV SOLN
Freq: Once | INTRAVENOUS | Status: AC
  Administered 2013-06-01: via INTRAVENOUS

## 2013-05-31 NOTE — ED Notes (Signed)
Bed: TX77 Expected date:  Expected time:  Means of arrival:  Comments: EMS 80yo back pain

## 2013-05-31 NOTE — ED Notes (Signed)
Brought in by EMS from home with c/o acute on chronic back pain.  Per EMS, pt reported that she has chronic, intermittent lower back pain and is on "Lidocaine patch"; pt reports that pain started getting progressively worse on Saturday--- tonight, pt reports that pain gets worse with little movement.

## 2013-06-01 MED ORDER — ONDANSETRON HCL 4 MG PO TABS: 4.0000 mg | ORAL_TABLET | Freq: Four times a day (QID) | ORAL | Status: DC

## 2013-06-01 MED ORDER — FENTANYL CITRATE 0.05 MG/ML IJ SOLN
12.5000 ug | INTRAMUSCULAR | Status: DC | PRN
Start: 2013-06-01 — End: 2013-06-01

## 2013-06-01 MED ORDER — TRAMADOL HCL 50 MG PO TABS: 50.0000 mg | ORAL_TABLET | Freq: Four times a day (QID) | ORAL | Status: DC | PRN

## 2013-06-01 NOTE — ED Notes (Signed)
Dr Marnette Burgess made aware of pt's ability to walk and concerns

## 2013-06-01 NOTE — Discharge Instructions (Signed)
Back, Compression Fracture °A compression fracture happens when a force is put upon the length of your spine. Slipping and falling on your bottom are examples of such a force. When this happens, sometimes the force is great enough to compress the building blocks (vertebral bodies) of your spine. Although this causes a lot of pain, this can usually be treated at home, unless your caregiver feels hospitalization is needed for pain control. °Your backbone (spinal column) is made up of 24 main vertebral bodies in addition to the sacrum and coccyx (see illustration). These are held together by tough fibrous tissues (ligaments) and by support of your muscles. Nerve roots pass through the openings between the vertebrae. A sudden wrenching move, injury, or a fall may cause a compression fracture of one of the vertebral bodies. This may result in back pain or spread of pain into the belly (abdomen), the buttocks, and down the leg into the foot. Pain may also be created by muscle spasm alone. °Large studies have been undertaken to determine the best possible course of action to help your back following injury and also to prevent future problems. The recommendations are as follows. °FOLLOWING A COMPRESSION FRACTURE: °Do the following only if advised by your caregiver.  °· If a back brace has been suggested or provided, wear it as directed. °· DO NOT stop wearing the back brace unless instructed by your caregiver. °· When allowed to return to regular activities, avoid a sedentary life style. Actively exercise. Sporadic weekend binges of tennis, racquetball, water skiing, may actually aggravate or create problems, especially if you are not in condition for that activity. °· Avoid sports requiring sudden body movements until you are in condition for them. Swimming and walking are safer activities. °· Maintain good posture. °· Avoid obesity. °· If not already done, you should have a DEXA scan. Based on the results, be treated for  osteoporosis. °FOLLOWING ACUTE (SUDDEN) INJURY: °· Only take over-the-counter or prescription medicines for pain, discomfort, or fever as directed by your caregiver. °· Use bed rest for only the most extreme acute episode. Prolonged bed rest may aggravate your condition. Ice used for acute conditions is effective. Use a large plastic bag filled with ice. Wrap it in a towel. This also provides excellent pain relief. This may be continuous. Or use it for 30 minutes every 2 hours during acute phase, then as needed. Heat for 30 minutes prior to activities is helpful. °· As soon as the acute phase (the time when your back is too painful for you to do normal activities) is over, it is important to resume normal activities and work hardening programs. Back injuries can cause potentially marked changes in lifestyle. So it is important to attack these problems aggressively. °· See your caregiver for continued problems. He or she can help or refer you for appropriate exercises, physical therapy and work hardening if needed. °· If you are given narcotic medications for your condition, for the next 24 hours DO NOT: °· Drive °· Operate machinery or power tools. °· Sign legal documents. °· DO NOT drink alcohol, take sleeping pills or other medications that may interfere with treatment. °If your caregiver has given you a follow-up appointment, it is very important to keep that appointment. Not keeping the appointment could result in a chronic or permanent injury, pain, and disability. If there is any problem keeping the appointment, you must call back to this facility for assistance.  °SEEK IMMEDIATE MEDICAL CARE IF: °· You develop numbness,   tingling, weakness, or problems with the use of your arms or legs. °· You develop severe back pain not relieved with medications. °· You have changes in bowel or bladder control. °· You have increasing pain in any areas of the body. °Document Released: 02/16/2005 Document Revised: 05/11/2011  Document Reviewed: 09/21/2007 °[employer]® Patient Information ©2014 [employer]. ° °

## 2013-06-05 NOTE — ED Provider Notes (Signed)
CSN: [id number]     Arrival date & time 05/31/13  2316 History   First MD Initiated Contact with Patient 05/31/13 2317     Chief Complaint  Patient presents with  . Back Pain     (Consider location/radiation/quality/duration/timing/severity/associated sxs/prior Treatment) HPI History provided by patient. Has history of chronic back pain, intermittent. Tonight at home and became more severe despite lidocaine patches. No recalled trauma. Pain worse with movement, twisting and bending. Patient also taking Tylenol at home. No radiation of pain from lower back. Sharp in quality which is typical for her chest more severe tonight. No weakness or numbness. No incontinence of bowel or bladder. No known alleviating factors. Patient states she cannot take any strong pain medications, and she will become violently ill.   Past Medical History  Diagnosis Date  . Hypertension   . Peripheral neuropathy   . Hyperlipidemia    History reviewed. No pertinent past surgical history. History reviewed. No pertinent family history. History  Substance Use Topics  . Smoking status: Never Smoker   . Smokeless tobacco: Never Used  . Alcohol Use: No   OB History   Grav Para Term Preterm Abortions TAB SAB Ect Mult Living                 Review of Systems  Constitutional: Negative for fever and chills.  Respiratory: Negative for shortness of breath.   Cardiovascular: Negative for chest pain.  Gastrointestinal: Negative for vomiting and abdominal pain.  Genitourinary: Negative for flank pain and difficulty urinating.  Musculoskeletal: Positive for back pain. Negative for neck pain and neck stiffness.  Skin: Negative for rash.  Neurological: Negative for weakness and numbness.  All other systems reviewed and are negative.      Allergies  Codeine and Other  Home Medications   Current Outpatient Rx  Name  Route  Sig  Dispense  Refill  . amLODipine (NORVASC) 5 MG tablet   Oral   Take 5 mg by mouth  daily.         Marland Kitchen lovastatin (MEVACOR) 20 MG tablet   Oral   Take 20 mg by mouth at bedtime.         . nebivolol (BYSTOLIC) 5 MG tablet   Oral   Take 5 mg by mouth daily.         . predniSONE (DELTASONE) 20 MG tablet   Oral   Take 20 mg by mouth daily with breakfast.         . ramipril (ALTACE) 10 MG capsule   Oral   Take 10 mg by mouth daily.         . ondansetron (ZOFRAN) 4 MG tablet   Oral   Take 1 tablet (4 mg total) by mouth every 6 (six) hours.   12 tablet   0   . traMADol (ULTRAM) 50 MG tablet   Oral   Take 1 tablet (50 mg total) by mouth every 6 (six) hours as needed for severe pain.   15 tablet   0    BP 177/94  Pulse 95  Temp(Src) 98.1 F (36.7 C) (Oral)  Resp 16  SpO2 95% Physical Exam  Constitutional: She is oriented to person, place, and time. She appears well-developed and well-nourished.  HENT:  Head: Normocephalic and atraumatic.  Eyes: EOM are normal. Pupils are equal, round, and reactive to light.  Neck: Neck supple.  Cardiovascular: Normal rate, regular rhythm and intact distal pulses.   Pulmonary/Chest: Effort normal and  breath sounds normal. No respiratory distress.  Abdominal: Soft. Bowel sounds are normal. She exhibits no distension.  Musculoskeletal: She exhibits no edema.  Tender over lower thoracic and lumbar spine. No midline deformity. No lower extremity deficits with equal DTRs, sensorium to light touch and dorsi plantar flexion. Equal dorsalis pedis pulses.  Neurological: She is alert and oriented to person, place, and time.  Skin: Skin is warm and dry.    ED Course  Procedures (including critical care time) Dg Thoracic Spine 2 View  06/01/2013   CLINICAL DATA:  Back pain, no known history.  EXAM: THORACIC SPINE - 2 VIEW  COMPARISON:  MR T SPINE W/O CM dated 08/23/2009  FINDINGS: Severe T10 osteoporotic compression fracture appears new from prior MRI. Severe T8 compression fracture was present previously though shows  progressed height loss. Accentuated thoracic kyphosis. No definite retropulsed bony fragments. No malalignment.  Mild L1 compression fracture may be acute.  Intervertebral disc heights generally preserved. Severe osteopenia. No destructive bony lesions. Severe thoracic aorta and abdominal aortic calcifications. Surgical clips in the right abdomen likely reflect cholecystectomy.  IMPRESSION: Severe T10 osteoporotic compression fracture new from prior MRI though, may not be acute. Progressed T8 compression fracture, with severe anterior wedging, accentuated thoracic kyphosis. No retropulsed bony fragments or malalignment.  Mild L1 osteoporotic compression fracture, new from prior MRI.   Electronically Signed   By: Elon Alas   On: 06/01/2013 00:08   Dg Lumbar Spine Complete  06/01/2013   CLINICAL DATA:  Back pain, no injury.  EXAM: LUMBAR SPINE - COMPLETE 4+ VIEW  COMPARISON:  CT ABD/PELVIS W CM dated 01/22/2012  FINDINGS: Osteopenia limits evaluation, however severe T10 and at least moderate T8 osteoporotic compression fractures. Mild L1 compression fracture may be new. Mild to moderate L5 compression fractures unchanged. No retropulsed bony fragments at any level.  No malalignment. Mild broad levoscoliosis. Severe L3-4, moderate to severe L4-5 and moderate L5-S1 degenerative disc disease. Severe lower lumbar facet arthropathy. No destructive bony lesions. No pars interarticularis defects.  Sacroiliac joints is symmetric, mild osteoarthrosis. Severe calcific atherosclerosis of the aortoiliac vessels.  IMPRESSION: Osteopenia. Severe T10 at least moderate T8 osteoporotic compression fractures are likely chronic. Mild L1 compression fracture may be new. Stable mild to moderate L5 compression fracture.  No malalignment.  Mild broad levoscoliosis.   Electronically Signed   By: Elon Alas   On: 06/01/2013 00:05   IV fentanyl and Zofran provided. On recheck pain improved her returning and medication  repeated. Patient feels comfortable plan discharge home and followup with primary care physician. She'll try prescription of tramadol with Zofran for pain control, including ice and lidocaine patches as prescribed by her physician. Strict return precautions provided and verbalized as understood. Family bedside is comfortable with this plan.    MDM   Final diagnoses:  Back pain  Compression fracture   Pain control IV narcotics Imaging obtained and reviewed as above - compression fractures noted.  Vital signs and nursing notes reviewed and considered    Teressa Lower, MD 06/05/13 [phone]

## 2013-06-22 DIAGNOSIS — T148XXA Other injury of unspecified body region, initial encounter: Secondary | ICD-10-CM | POA: Diagnosis not present

## 2013-06-27 ENCOUNTER — Other Ambulatory Visit: Payer: Self-pay | Admitting: Internal Medicine

## 2013-06-27 DIAGNOSIS — IMO0002 Reserved for concepts with insufficient information to code with codable children: Secondary | ICD-10-CM

## 2013-07-03 DIAGNOSIS — Z5189 Encounter for other specified aftercare: Secondary | ICD-10-CM | POA: Diagnosis not present

## 2013-07-03 DIAGNOSIS — R269 Unspecified abnormalities of gait and mobility: Secondary | ICD-10-CM | POA: Diagnosis not present

## 2013-07-03 DIAGNOSIS — I10 Essential (primary) hypertension: Secondary | ICD-10-CM | POA: Diagnosis not present

## 2013-07-03 DIAGNOSIS — M810 Age-related osteoporosis without current pathological fracture: Secondary | ICD-10-CM | POA: Diagnosis not present

## 2013-07-03 DIAGNOSIS — G609 Hereditary and idiopathic neuropathy, unspecified: Secondary | ICD-10-CM | POA: Diagnosis not present

## 2013-07-03 DIAGNOSIS — M8448XD Pathological fracture, other site, subsequent encounter for fracture with routine healing: Secondary | ICD-10-CM | POA: Diagnosis not present

## 2013-07-05 DIAGNOSIS — M810 Age-related osteoporosis without current pathological fracture: Secondary | ICD-10-CM | POA: Diagnosis not present

## 2013-07-05 DIAGNOSIS — M8448XD Pathological fracture, other site, subsequent encounter for fracture with routine healing: Secondary | ICD-10-CM | POA: Diagnosis not present

## 2013-07-05 DIAGNOSIS — I10 Essential (primary) hypertension: Secondary | ICD-10-CM | POA: Diagnosis not present

## 2013-07-05 DIAGNOSIS — R269 Unspecified abnormalities of gait and mobility: Secondary | ICD-10-CM | POA: Diagnosis not present

## 2013-07-05 DIAGNOSIS — G609 Hereditary and idiopathic neuropathy, unspecified: Secondary | ICD-10-CM | POA: Diagnosis not present

## 2013-07-05 DIAGNOSIS — Z5189 Encounter for other specified aftercare: Secondary | ICD-10-CM | POA: Diagnosis not present

## 2013-07-11 DIAGNOSIS — G609 Hereditary and idiopathic neuropathy, unspecified: Secondary | ICD-10-CM | POA: Diagnosis not present

## 2013-07-11 DIAGNOSIS — M8448XD Pathological fracture, other site, subsequent encounter for fracture with routine healing: Secondary | ICD-10-CM | POA: Diagnosis not present

## 2013-07-11 DIAGNOSIS — R269 Unspecified abnormalities of gait and mobility: Secondary | ICD-10-CM | POA: Diagnosis not present

## 2013-07-11 DIAGNOSIS — Z5189 Encounter for other specified aftercare: Secondary | ICD-10-CM | POA: Diagnosis not present

## 2013-07-11 DIAGNOSIS — I10 Essential (primary) hypertension: Secondary | ICD-10-CM | POA: Diagnosis not present

## 2013-07-11 DIAGNOSIS — M810 Age-related osteoporosis without current pathological fracture: Secondary | ICD-10-CM | POA: Diagnosis not present

## 2013-07-13 DIAGNOSIS — M810 Age-related osteoporosis without current pathological fracture: Secondary | ICD-10-CM | POA: Diagnosis not present

## 2013-07-13 DIAGNOSIS — I10 Essential (primary) hypertension: Secondary | ICD-10-CM | POA: Diagnosis not present

## 2013-07-13 DIAGNOSIS — R269 Unspecified abnormalities of gait and mobility: Secondary | ICD-10-CM | POA: Diagnosis not present

## 2013-07-13 DIAGNOSIS — M8448XD Pathological fracture, other site, subsequent encounter for fracture with routine healing: Secondary | ICD-10-CM | POA: Diagnosis not present

## 2013-07-13 DIAGNOSIS — G609 Hereditary and idiopathic neuropathy, unspecified: Secondary | ICD-10-CM | POA: Diagnosis not present

## 2013-07-13 DIAGNOSIS — Z5189 Encounter for other specified aftercare: Secondary | ICD-10-CM | POA: Diagnosis not present

## 2013-07-18 DIAGNOSIS — M8448XD Pathological fracture, other site, subsequent encounter for fracture with routine healing: Secondary | ICD-10-CM | POA: Diagnosis not present

## 2013-07-18 DIAGNOSIS — G609 Hereditary and idiopathic neuropathy, unspecified: Secondary | ICD-10-CM | POA: Diagnosis not present

## 2013-07-18 DIAGNOSIS — R269 Unspecified abnormalities of gait and mobility: Secondary | ICD-10-CM | POA: Diagnosis not present

## 2013-07-18 DIAGNOSIS — I10 Essential (primary) hypertension: Secondary | ICD-10-CM | POA: Diagnosis not present

## 2013-07-18 DIAGNOSIS — Z5189 Encounter for other specified aftercare: Secondary | ICD-10-CM | POA: Diagnosis not present

## 2013-07-18 DIAGNOSIS — M810 Age-related osteoporosis without current pathological fracture: Secondary | ICD-10-CM | POA: Diagnosis not present

## 2013-07-20 DIAGNOSIS — R21 Rash and other nonspecific skin eruption: Secondary | ICD-10-CM | POA: Diagnosis not present

## 2013-07-20 DIAGNOSIS — R7309 Other abnormal glucose: Secondary | ICD-10-CM | POA: Diagnosis not present

## 2013-07-20 DIAGNOSIS — I10 Essential (primary) hypertension: Secondary | ICD-10-CM | POA: Diagnosis not present

## 2013-07-20 DIAGNOSIS — N289 Disorder of kidney and ureter, unspecified: Secondary | ICD-10-CM | POA: Diagnosis not present

## 2013-07-21 DIAGNOSIS — Z5189 Encounter for other specified aftercare: Secondary | ICD-10-CM | POA: Diagnosis not present

## 2013-07-21 DIAGNOSIS — R269 Unspecified abnormalities of gait and mobility: Secondary | ICD-10-CM | POA: Diagnosis not present

## 2013-07-21 DIAGNOSIS — M8448XD Pathological fracture, other site, subsequent encounter for fracture with routine healing: Secondary | ICD-10-CM | POA: Diagnosis not present

## 2013-07-21 DIAGNOSIS — I10 Essential (primary) hypertension: Secondary | ICD-10-CM | POA: Diagnosis not present

## 2013-07-21 DIAGNOSIS — G609 Hereditary and idiopathic neuropathy, unspecified: Secondary | ICD-10-CM | POA: Diagnosis not present

## 2013-07-21 DIAGNOSIS — M810 Age-related osteoporosis without current pathological fracture: Secondary | ICD-10-CM | POA: Diagnosis not present

## 2013-07-25 ENCOUNTER — Other Ambulatory Visit: Payer: Self-pay | Admitting: Internal Medicine

## 2013-07-25 DIAGNOSIS — I10 Essential (primary) hypertension: Secondary | ICD-10-CM | POA: Diagnosis not present

## 2013-07-25 DIAGNOSIS — Z5189 Encounter for other specified aftercare: Secondary | ICD-10-CM | POA: Diagnosis not present

## 2013-07-25 DIAGNOSIS — N2889 Other specified disorders of kidney and ureter: Secondary | ICD-10-CM

## 2013-07-25 DIAGNOSIS — M8448XD Pathological fracture, other site, subsequent encounter for fracture with routine healing: Secondary | ICD-10-CM | POA: Diagnosis not present

## 2013-07-25 DIAGNOSIS — R269 Unspecified abnormalities of gait and mobility: Secondary | ICD-10-CM | POA: Diagnosis not present

## 2013-07-25 DIAGNOSIS — M810 Age-related osteoporosis without current pathological fracture: Secondary | ICD-10-CM | POA: Diagnosis not present

## 2013-07-25 DIAGNOSIS — G609 Hereditary and idiopathic neuropathy, unspecified: Secondary | ICD-10-CM | POA: Diagnosis not present

## 2013-07-27 DIAGNOSIS — I10 Essential (primary) hypertension: Secondary | ICD-10-CM | POA: Diagnosis not present

## 2013-07-27 DIAGNOSIS — R269 Unspecified abnormalities of gait and mobility: Secondary | ICD-10-CM | POA: Diagnosis not present

## 2013-07-27 DIAGNOSIS — M810 Age-related osteoporosis without current pathological fracture: Secondary | ICD-10-CM | POA: Diagnosis not present

## 2013-07-27 DIAGNOSIS — G609 Hereditary and idiopathic neuropathy, unspecified: Secondary | ICD-10-CM | POA: Diagnosis not present

## 2013-07-27 DIAGNOSIS — M8448XD Pathological fracture, other site, subsequent encounter for fracture with routine healing: Secondary | ICD-10-CM | POA: Diagnosis not present

## 2013-07-27 DIAGNOSIS — Z5189 Encounter for other specified aftercare: Secondary | ICD-10-CM | POA: Diagnosis not present

## 2013-08-11 DIAGNOSIS — I10 Essential (primary) hypertension: Secondary | ICD-10-CM | POA: Diagnosis not present

## 2013-08-11 DIAGNOSIS — R269 Unspecified abnormalities of gait and mobility: Secondary | ICD-10-CM | POA: Diagnosis not present

## 2013-08-11 DIAGNOSIS — M8448XD Pathological fracture, other site, subsequent encounter for fracture with routine healing: Secondary | ICD-10-CM | POA: Diagnosis not present

## 2013-08-11 DIAGNOSIS — Z5189 Encounter for other specified aftercare: Secondary | ICD-10-CM | POA: Diagnosis not present

## 2013-08-11 DIAGNOSIS — G609 Hereditary and idiopathic neuropathy, unspecified: Secondary | ICD-10-CM | POA: Diagnosis not present

## 2013-08-11 DIAGNOSIS — M810 Age-related osteoporosis without current pathological fracture: Secondary | ICD-10-CM | POA: Diagnosis not present

## 2013-08-17 DIAGNOSIS — N289 Disorder of kidney and ureter, unspecified: Secondary | ICD-10-CM | POA: Diagnosis not present

## 2013-08-17 DIAGNOSIS — R7309 Other abnormal glucose: Secondary | ICD-10-CM | POA: Diagnosis not present

## 2013-08-17 DIAGNOSIS — R21 Rash and other nonspecific skin eruption: Secondary | ICD-10-CM | POA: Diagnosis not present

## 2013-08-17 DIAGNOSIS — I10 Essential (primary) hypertension: Secondary | ICD-10-CM | POA: Diagnosis not present

## 2013-08-24 DIAGNOSIS — H409 Unspecified glaucoma: Secondary | ICD-10-CM | POA: Diagnosis not present

## 2013-08-24 DIAGNOSIS — H35729 Serous detachment of retinal pigment epithelium, unspecified eye: Secondary | ICD-10-CM | POA: Diagnosis not present

## 2013-08-24 DIAGNOSIS — H4011X0 Primary open-angle glaucoma, stage unspecified: Secondary | ICD-10-CM | POA: Diagnosis not present

## 2013-08-24 DIAGNOSIS — Z961 Presence of intraocular lens: Secondary | ICD-10-CM | POA: Diagnosis not present

## 2013-08-24 DIAGNOSIS — H353290 Exudative age-related macular degeneration, unspecified eye, stage unspecified: Secondary | ICD-10-CM | POA: Diagnosis not present

## 2013-10-23 DIAGNOSIS — H353290 Exudative age-related macular degeneration, unspecified eye, stage unspecified: Secondary | ICD-10-CM | POA: Diagnosis not present

## 2013-10-23 DIAGNOSIS — H4011X0 Primary open-angle glaucoma, stage unspecified: Secondary | ICD-10-CM | POA: Diagnosis not present

## 2013-10-23 DIAGNOSIS — H409 Unspecified glaucoma: Secondary | ICD-10-CM | POA: Diagnosis not present

## 2013-10-25 DIAGNOSIS — H353290 Exudative age-related macular degeneration, unspecified eye, stage unspecified: Secondary | ICD-10-CM | POA: Diagnosis not present

## 2013-10-25 DIAGNOSIS — H409 Unspecified glaucoma: Secondary | ICD-10-CM | POA: Diagnosis not present

## 2013-10-25 DIAGNOSIS — H4011X0 Primary open-angle glaucoma, stage unspecified: Secondary | ICD-10-CM | POA: Diagnosis not present

## 2013-11-22 DIAGNOSIS — H353290 Exudative age-related macular degeneration, unspecified eye, stage unspecified: Secondary | ICD-10-CM | POA: Diagnosis not present

## 2013-11-22 DIAGNOSIS — H409 Unspecified glaucoma: Secondary | ICD-10-CM | POA: Diagnosis not present

## 2013-11-22 DIAGNOSIS — H4011X0 Primary open-angle glaucoma, stage unspecified: Secondary | ICD-10-CM | POA: Diagnosis not present

## 2013-11-30 DIAGNOSIS — Z961 Presence of intraocular lens: Secondary | ICD-10-CM | POA: Diagnosis not present

## 2013-11-30 DIAGNOSIS — H3532 Exudative age-related macular degeneration: Secondary | ICD-10-CM | POA: Diagnosis not present

## 2013-11-30 DIAGNOSIS — H4011X3 Primary open-angle glaucoma, severe stage: Secondary | ICD-10-CM | POA: Diagnosis not present

## 2013-11-30 DIAGNOSIS — H35723 Serous detachment of retinal pigment epithelium, bilateral: Secondary | ICD-10-CM | POA: Diagnosis not present

## 2013-12-13 DIAGNOSIS — I10 Essential (primary) hypertension: Secondary | ICD-10-CM | POA: Diagnosis not present

## 2013-12-13 DIAGNOSIS — R739 Hyperglycemia, unspecified: Secondary | ICD-10-CM | POA: Diagnosis not present

## 2013-12-18 DIAGNOSIS — E780 Pure hypercholesterolemia: Secondary | ICD-10-CM | POA: Diagnosis not present

## 2013-12-18 DIAGNOSIS — N29 Other disorders of kidney and ureter in diseases classified elsewhere: Secondary | ICD-10-CM | POA: Diagnosis not present

## 2013-12-18 DIAGNOSIS — I10 Essential (primary) hypertension: Secondary | ICD-10-CM | POA: Diagnosis not present

## 2013-12-18 DIAGNOSIS — R739 Hyperglycemia, unspecified: Secondary | ICD-10-CM | POA: Diagnosis not present

## 2014-04-06 ENCOUNTER — Ambulatory Visit (HOSPITAL_COMMUNITY)
Admission: RE | Admit: 2014-04-06 | Discharge: 2014-04-06 | Disposition: A | Discharge status: Home / Self Care | Payer: Medicare Other | Source: Ambulatory Visit | Attending: Internal Medicine | Admitting: Internal Medicine

## 2014-04-06 ENCOUNTER — Other Ambulatory Visit (HOSPITAL_COMMUNITY): Payer: Self-pay | Admitting: Internal Medicine

## 2014-04-06 DIAGNOSIS — R600 Localized edema: Secondary | ICD-10-CM | POA: Diagnosis not present

## 2014-04-06 DIAGNOSIS — I10 Essential (primary) hypertension: Secondary | ICD-10-CM | POA: Diagnosis not present

## 2014-04-06 DIAGNOSIS — M7989 Other specified soft tissue disorders: Secondary | ICD-10-CM

## 2014-04-06 DIAGNOSIS — M5416 Radiculopathy, lumbar region: Secondary | ICD-10-CM | POA: Diagnosis not present

## 2014-04-06 NOTE — Progress Notes (Signed)
*  PRELIMINARY RESULTS* Vascular Ultrasound Left lower extremity venous duplex has been completed.  Preliminary findings:  No evidence of DVT.     Landry Mellow, RDMS, RVT  04/06/2014, 4:43 PM

## 2014-04-26 DIAGNOSIS — I10 Essential (primary) hypertension: Secondary | ICD-10-CM | POA: Diagnosis not present

## 2014-05-01 DIAGNOSIS — R739 Hyperglycemia, unspecified: Secondary | ICD-10-CM | POA: Diagnosis not present

## 2014-05-01 DIAGNOSIS — E780 Pure hypercholesterolemia: Secondary | ICD-10-CM | POA: Diagnosis not present

## 2014-05-01 DIAGNOSIS — I10 Essential (primary) hypertension: Secondary | ICD-10-CM | POA: Diagnosis not present

## 2014-05-01 DIAGNOSIS — M810 Age-related osteoporosis without current pathological fracture: Secondary | ICD-10-CM | POA: Diagnosis not present

## 2014-05-21 DIAGNOSIS — I872 Venous insufficiency (chronic) (peripheral): Secondary | ICD-10-CM | POA: Diagnosis not present

## 2014-05-21 DIAGNOSIS — B351 Tinea unguium: Secondary | ICD-10-CM | POA: Diagnosis not present

## 2014-05-21 DIAGNOSIS — G609 Hereditary and idiopathic neuropathy, unspecified: Secondary | ICD-10-CM | POA: Diagnosis not present

## 2014-05-21 DIAGNOSIS — M2010 Hallux valgus (acquired), unspecified foot: Secondary | ICD-10-CM | POA: Diagnosis not present

## 2014-05-21 DIAGNOSIS — M2040 Other hammer toe(s) (acquired), unspecified foot: Secondary | ICD-10-CM | POA: Diagnosis not present

## 2014-05-21 DIAGNOSIS — L84 Corns and callosities: Secondary | ICD-10-CM | POA: Diagnosis not present

## 2014-06-04 DIAGNOSIS — H3532 Exudative age-related macular degeneration: Secondary | ICD-10-CM | POA: Diagnosis not present

## 2014-06-04 DIAGNOSIS — H4011X3 Primary open-angle glaucoma, severe stage: Secondary | ICD-10-CM | POA: Diagnosis not present

## 2014-06-04 DIAGNOSIS — H4011X4 Primary open-angle glaucoma, indeterminate stage: Secondary | ICD-10-CM | POA: Diagnosis not present

## 2014-06-07 DIAGNOSIS — H4011X4 Primary open-angle glaucoma, indeterminate stage: Secondary | ICD-10-CM | POA: Diagnosis not present

## 2014-06-07 DIAGNOSIS — Z961 Presence of intraocular lens: Secondary | ICD-10-CM | POA: Diagnosis not present

## 2014-06-07 DIAGNOSIS — H35723 Serous detachment of retinal pigment epithelium, bilateral: Secondary | ICD-10-CM | POA: Diagnosis not present

## 2014-06-07 DIAGNOSIS — H3532 Exudative age-related macular degeneration: Secondary | ICD-10-CM | POA: Diagnosis not present

## 2014-06-07 DIAGNOSIS — H4011X3 Primary open-angle glaucoma, severe stage: Secondary | ICD-10-CM | POA: Diagnosis not present

## 2014-09-06 DIAGNOSIS — H4011X3 Primary open-angle glaucoma, severe stage: Secondary | ICD-10-CM | POA: Diagnosis not present

## 2014-09-06 DIAGNOSIS — H4011X4 Primary open-angle glaucoma, indeterminate stage: Secondary | ICD-10-CM | POA: Diagnosis not present

## 2014-09-06 DIAGNOSIS — Z961 Presence of intraocular lens: Secondary | ICD-10-CM | POA: Diagnosis not present

## 2014-09-06 DIAGNOSIS — H35723 Serous detachment of retinal pigment epithelium, bilateral: Secondary | ICD-10-CM | POA: Diagnosis not present

## 2014-09-06 DIAGNOSIS — H3532 Exudative age-related macular degeneration: Secondary | ICD-10-CM | POA: Diagnosis not present

## 2014-10-15 DIAGNOSIS — L989 Disorder of the skin and subcutaneous tissue, unspecified: Secondary | ICD-10-CM | POA: Diagnosis not present

## 2014-10-15 DIAGNOSIS — R609 Edema, unspecified: Secondary | ICD-10-CM | POA: Diagnosis not present

## 2014-10-19 DIAGNOSIS — S81802A Unspecified open wound, left lower leg, initial encounter: Secondary | ICD-10-CM | POA: Diagnosis not present

## 2014-10-29 DIAGNOSIS — I10 Essential (primary) hypertension: Secondary | ICD-10-CM | POA: Diagnosis not present

## 2014-10-29 DIAGNOSIS — M810 Age-related osteoporosis without current pathological fracture: Secondary | ICD-10-CM | POA: Diagnosis not present

## 2014-11-01 DIAGNOSIS — R739 Hyperglycemia, unspecified: Secondary | ICD-10-CM | POA: Diagnosis not present

## 2014-11-01 DIAGNOSIS — N289 Disorder of kidney and ureter, unspecified: Secondary | ICD-10-CM | POA: Diagnosis not present

## 2014-11-01 DIAGNOSIS — M810 Age-related osteoporosis without current pathological fracture: Secondary | ICD-10-CM | POA: Diagnosis not present

## 2014-11-01 DIAGNOSIS — I10 Essential (primary) hypertension: Secondary | ICD-10-CM | POA: Diagnosis not present

## 2014-11-14 DIAGNOSIS — M2041 Other hammer toe(s) (acquired), right foot: Secondary | ICD-10-CM | POA: Diagnosis not present

## 2014-11-14 DIAGNOSIS — B351 Tinea unguium: Secondary | ICD-10-CM | POA: Diagnosis not present

## 2014-12-13 DIAGNOSIS — H353231 Exudative age-related macular degeneration, bilateral, with active choroidal neovascularization: Secondary | ICD-10-CM | POA: Diagnosis not present

## 2014-12-13 DIAGNOSIS — H401114 Primary open-angle glaucoma, right eye, indeterminate stage: Secondary | ICD-10-CM | POA: Diagnosis not present

## 2014-12-13 DIAGNOSIS — Z961 Presence of intraocular lens: Secondary | ICD-10-CM | POA: Diagnosis not present

## 2014-12-13 DIAGNOSIS — H35723 Serous detachment of retinal pigment epithelium, bilateral: Secondary | ICD-10-CM | POA: Diagnosis not present

## 2014-12-13 DIAGNOSIS — H353290 Exudative age-related macular degeneration, unspecified eye, stage unspecified: Secondary | ICD-10-CM | POA: Diagnosis not present

## 2014-12-13 DIAGNOSIS — H401123 Primary open-angle glaucoma, left eye, severe stage: Secondary | ICD-10-CM | POA: Diagnosis not present

## 2014-12-17 DIAGNOSIS — B351 Tinea unguium: Secondary | ICD-10-CM | POA: Diagnosis not present

## 2014-12-31 DIAGNOSIS — H401114 Primary open-angle glaucoma, right eye, indeterminate stage: Secondary | ICD-10-CM | POA: Diagnosis not present

## 2014-12-31 DIAGNOSIS — H401123 Primary open-angle glaucoma, left eye, severe stage: Secondary | ICD-10-CM | POA: Diagnosis not present

## 2015-02-18 DIAGNOSIS — R0781 Pleurodynia: Secondary | ICD-10-CM | POA: Diagnosis not present

## 2015-02-26 DIAGNOSIS — I10 Essential (primary) hypertension: Secondary | ICD-10-CM | POA: Diagnosis not present

## 2015-02-26 DIAGNOSIS — N289 Disorder of kidney and ureter, unspecified: Secondary | ICD-10-CM | POA: Diagnosis not present

## 2015-02-26 DIAGNOSIS — E559 Vitamin D deficiency, unspecified: Secondary | ICD-10-CM | POA: Diagnosis not present

## 2015-03-05 DIAGNOSIS — L98499 Non-pressure chronic ulcer of skin of other sites with unspecified severity: Secondary | ICD-10-CM | POA: Diagnosis not present

## 2015-03-05 DIAGNOSIS — I10 Essential (primary) hypertension: Secondary | ICD-10-CM | POA: Diagnosis not present

## 2015-03-05 DIAGNOSIS — E7800 Pure hypercholesterolemia, unspecified: Secondary | ICD-10-CM | POA: Diagnosis not present

## 2015-03-05 DIAGNOSIS — N289 Disorder of kidney and ureter, unspecified: Secondary | ICD-10-CM | POA: Diagnosis not present

## 2015-03-14 DIAGNOSIS — Z961 Presence of intraocular lens: Secondary | ICD-10-CM | POA: Diagnosis not present

## 2015-03-14 DIAGNOSIS — H35723 Serous detachment of retinal pigment epithelium, bilateral: Secondary | ICD-10-CM | POA: Diagnosis not present

## 2015-03-14 DIAGNOSIS — H401114 Primary open-angle glaucoma, right eye, indeterminate stage: Secondary | ICD-10-CM | POA: Diagnosis not present

## 2015-03-14 DIAGNOSIS — H401123 Primary open-angle glaucoma, left eye, severe stage: Secondary | ICD-10-CM | POA: Diagnosis not present

## 2015-03-14 DIAGNOSIS — H353231 Exudative age-related macular degeneration, bilateral, with active choroidal neovascularization: Secondary | ICD-10-CM | POA: Diagnosis not present

## 2015-03-20 DIAGNOSIS — L98499 Non-pressure chronic ulcer of skin of other sites with unspecified severity: Secondary | ICD-10-CM | POA: Diagnosis not present

## 2015-03-20 DIAGNOSIS — R609 Edema, unspecified: Secondary | ICD-10-CM | POA: Diagnosis not present

## 2015-03-20 DIAGNOSIS — I10 Essential (primary) hypertension: Secondary | ICD-10-CM | POA: Diagnosis not present

## 2015-05-24 DIAGNOSIS — I10 Essential (primary) hypertension: Secondary | ICD-10-CM | POA: Diagnosis not present

## 2015-05-24 DIAGNOSIS — R609 Edema, unspecified: Secondary | ICD-10-CM | POA: Diagnosis not present

## 2015-05-30 DIAGNOSIS — I10 Essential (primary) hypertension: Secondary | ICD-10-CM | POA: Diagnosis not present

## 2015-05-30 DIAGNOSIS — R609 Edema, unspecified: Secondary | ICD-10-CM | POA: Diagnosis not present

## 2015-05-30 DIAGNOSIS — M810 Age-related osteoporosis without current pathological fracture: Secondary | ICD-10-CM | POA: Diagnosis not present

## 2015-06-03 DIAGNOSIS — H401123 Primary open-angle glaucoma, left eye, severe stage: Secondary | ICD-10-CM | POA: Diagnosis not present

## 2015-06-03 DIAGNOSIS — H401114 Primary open-angle glaucoma, right eye, indeterminate stage: Secondary | ICD-10-CM | POA: Diagnosis not present

## 2015-06-13 DIAGNOSIS — H35723 Serous detachment of retinal pigment epithelium, bilateral: Secondary | ICD-10-CM | POA: Diagnosis not present

## 2015-06-13 DIAGNOSIS — H401123 Primary open-angle glaucoma, left eye, severe stage: Secondary | ICD-10-CM | POA: Diagnosis not present

## 2015-06-13 DIAGNOSIS — H353231 Exudative age-related macular degeneration, bilateral, with active choroidal neovascularization: Secondary | ICD-10-CM | POA: Diagnosis not present

## 2015-06-13 DIAGNOSIS — H401114 Primary open-angle glaucoma, right eye, indeterminate stage: Secondary | ICD-10-CM | POA: Diagnosis not present

## 2015-06-13 DIAGNOSIS — Z961 Presence of intraocular lens: Secondary | ICD-10-CM | POA: Diagnosis not present

## 2015-07-31 DIAGNOSIS — K5900 Constipation, unspecified: Secondary | ICD-10-CM | POA: Diagnosis not present

## 2015-08-01 DIAGNOSIS — K5900 Constipation, unspecified: Secondary | ICD-10-CM | POA: Diagnosis not present

## 2015-08-06 DIAGNOSIS — L98499 Non-pressure chronic ulcer of skin of other sites with unspecified severity: Secondary | ICD-10-CM | POA: Diagnosis not present

## 2015-08-06 DIAGNOSIS — R609 Edema, unspecified: Secondary | ICD-10-CM | POA: Diagnosis not present

## 2015-08-08 DIAGNOSIS — M353 Polymyalgia rheumatica: Secondary | ICD-10-CM | POA: Diagnosis not present

## 2015-08-08 DIAGNOSIS — L97829 Non-pressure chronic ulcer of other part of left lower leg with unspecified severity: Secondary | ICD-10-CM | POA: Diagnosis not present

## 2015-08-08 DIAGNOSIS — G629 Polyneuropathy, unspecified: Secondary | ICD-10-CM | POA: Diagnosis not present

## 2015-08-08 DIAGNOSIS — I872 Venous insufficiency (chronic) (peripheral): Secondary | ICD-10-CM | POA: Diagnosis not present

## 2015-08-08 DIAGNOSIS — L989 Disorder of the skin and subcutaneous tissue, unspecified: Secondary | ICD-10-CM | POA: Diagnosis not present

## 2015-08-08 DIAGNOSIS — I10 Essential (primary) hypertension: Secondary | ICD-10-CM | POA: Diagnosis not present

## 2015-08-13 DIAGNOSIS — I872 Venous insufficiency (chronic) (peripheral): Secondary | ICD-10-CM | POA: Diagnosis not present

## 2015-08-13 DIAGNOSIS — I10 Essential (primary) hypertension: Secondary | ICD-10-CM | POA: Diagnosis not present

## 2015-08-13 DIAGNOSIS — L989 Disorder of the skin and subcutaneous tissue, unspecified: Secondary | ICD-10-CM | POA: Diagnosis not present

## 2015-08-13 DIAGNOSIS — L97829 Non-pressure chronic ulcer of other part of left lower leg with unspecified severity: Secondary | ICD-10-CM | POA: Diagnosis not present

## 2015-08-13 DIAGNOSIS — G629 Polyneuropathy, unspecified: Secondary | ICD-10-CM | POA: Diagnosis not present

## 2015-08-13 DIAGNOSIS — M353 Polymyalgia rheumatica: Secondary | ICD-10-CM | POA: Diagnosis not present

## 2015-08-14 DIAGNOSIS — L98499 Non-pressure chronic ulcer of skin of other sites with unspecified severity: Secondary | ICD-10-CM | POA: Diagnosis not present

## 2015-08-14 DIAGNOSIS — M79604 Pain in right leg: Secondary | ICD-10-CM | POA: Diagnosis not present

## 2015-08-15 DIAGNOSIS — L989 Disorder of the skin and subcutaneous tissue, unspecified: Secondary | ICD-10-CM | POA: Diagnosis not present

## 2015-08-15 DIAGNOSIS — G629 Polyneuropathy, unspecified: Secondary | ICD-10-CM | POA: Diagnosis not present

## 2015-08-15 DIAGNOSIS — M353 Polymyalgia rheumatica: Secondary | ICD-10-CM | POA: Diagnosis not present

## 2015-08-15 DIAGNOSIS — I872 Venous insufficiency (chronic) (peripheral): Secondary | ICD-10-CM | POA: Diagnosis not present

## 2015-08-15 DIAGNOSIS — I10 Essential (primary) hypertension: Secondary | ICD-10-CM | POA: Diagnosis not present

## 2015-08-15 DIAGNOSIS — L97829 Non-pressure chronic ulcer of other part of left lower leg with unspecified severity: Secondary | ICD-10-CM | POA: Diagnosis not present

## 2015-08-19 DIAGNOSIS — I10 Essential (primary) hypertension: Secondary | ICD-10-CM | POA: Diagnosis not present

## 2015-08-19 DIAGNOSIS — I872 Venous insufficiency (chronic) (peripheral): Secondary | ICD-10-CM | POA: Diagnosis not present

## 2015-08-19 DIAGNOSIS — L989 Disorder of the skin and subcutaneous tissue, unspecified: Secondary | ICD-10-CM | POA: Diagnosis not present

## 2015-08-19 DIAGNOSIS — M353 Polymyalgia rheumatica: Secondary | ICD-10-CM | POA: Diagnosis not present

## 2015-08-19 DIAGNOSIS — G629 Polyneuropathy, unspecified: Secondary | ICD-10-CM | POA: Diagnosis not present

## 2015-08-19 DIAGNOSIS — L97829 Non-pressure chronic ulcer of other part of left lower leg with unspecified severity: Secondary | ICD-10-CM | POA: Diagnosis not present

## 2015-08-21 DIAGNOSIS — I872 Venous insufficiency (chronic) (peripheral): Secondary | ICD-10-CM | POA: Diagnosis not present

## 2015-08-21 DIAGNOSIS — M353 Polymyalgia rheumatica: Secondary | ICD-10-CM | POA: Diagnosis not present

## 2015-08-21 DIAGNOSIS — G629 Polyneuropathy, unspecified: Secondary | ICD-10-CM | POA: Diagnosis not present

## 2015-08-21 DIAGNOSIS — L97829 Non-pressure chronic ulcer of other part of left lower leg with unspecified severity: Secondary | ICD-10-CM | POA: Diagnosis not present

## 2015-08-21 DIAGNOSIS — I10 Essential (primary) hypertension: Secondary | ICD-10-CM | POA: Diagnosis not present

## 2015-08-21 DIAGNOSIS — L989 Disorder of the skin and subcutaneous tissue, unspecified: Secondary | ICD-10-CM | POA: Diagnosis not present

## 2015-08-23 DIAGNOSIS — L97829 Non-pressure chronic ulcer of other part of left lower leg with unspecified severity: Secondary | ICD-10-CM | POA: Diagnosis not present

## 2015-08-23 DIAGNOSIS — G629 Polyneuropathy, unspecified: Secondary | ICD-10-CM | POA: Diagnosis not present

## 2015-08-23 DIAGNOSIS — L989 Disorder of the skin and subcutaneous tissue, unspecified: Secondary | ICD-10-CM | POA: Diagnosis not present

## 2015-08-23 DIAGNOSIS — I872 Venous insufficiency (chronic) (peripheral): Secondary | ICD-10-CM | POA: Diagnosis not present

## 2015-08-23 DIAGNOSIS — I10 Essential (primary) hypertension: Secondary | ICD-10-CM | POA: Diagnosis not present

## 2015-08-23 DIAGNOSIS — M353 Polymyalgia rheumatica: Secondary | ICD-10-CM | POA: Diagnosis not present

## 2015-08-26 DIAGNOSIS — I10 Essential (primary) hypertension: Secondary | ICD-10-CM | POA: Diagnosis not present

## 2015-08-26 DIAGNOSIS — E559 Vitamin D deficiency, unspecified: Secondary | ICD-10-CM | POA: Diagnosis not present

## 2015-08-26 DIAGNOSIS — M810 Age-related osteoporosis without current pathological fracture: Secondary | ICD-10-CM | POA: Diagnosis not present

## 2015-08-26 DIAGNOSIS — E7800 Pure hypercholesterolemia, unspecified: Secondary | ICD-10-CM | POA: Diagnosis not present

## 2015-08-27 DIAGNOSIS — I10 Essential (primary) hypertension: Secondary | ICD-10-CM | POA: Diagnosis not present

## 2015-08-27 DIAGNOSIS — L989 Disorder of the skin and subcutaneous tissue, unspecified: Secondary | ICD-10-CM | POA: Diagnosis not present

## 2015-08-27 DIAGNOSIS — I872 Venous insufficiency (chronic) (peripheral): Secondary | ICD-10-CM | POA: Diagnosis not present

## 2015-08-27 DIAGNOSIS — G629 Polyneuropathy, unspecified: Secondary | ICD-10-CM | POA: Diagnosis not present

## 2015-08-27 DIAGNOSIS — L97829 Non-pressure chronic ulcer of other part of left lower leg with unspecified severity: Secondary | ICD-10-CM | POA: Diagnosis not present

## 2015-08-27 DIAGNOSIS — M353 Polymyalgia rheumatica: Secondary | ICD-10-CM | POA: Diagnosis not present

## 2015-08-29 DIAGNOSIS — K5900 Constipation, unspecified: Secondary | ICD-10-CM | POA: Diagnosis not present

## 2015-08-29 DIAGNOSIS — E7800 Pure hypercholesterolemia, unspecified: Secondary | ICD-10-CM | POA: Diagnosis not present

## 2015-08-29 DIAGNOSIS — N289 Disorder of kidney and ureter, unspecified: Secondary | ICD-10-CM | POA: Diagnosis not present

## 2015-08-29 DIAGNOSIS — I10 Essential (primary) hypertension: Secondary | ICD-10-CM | POA: Diagnosis not present

## 2015-08-30 DIAGNOSIS — I872 Venous insufficiency (chronic) (peripheral): Secondary | ICD-10-CM | POA: Diagnosis not present

## 2015-08-30 DIAGNOSIS — I10 Essential (primary) hypertension: Secondary | ICD-10-CM | POA: Diagnosis not present

## 2015-08-30 DIAGNOSIS — L989 Disorder of the skin and subcutaneous tissue, unspecified: Secondary | ICD-10-CM | POA: Diagnosis not present

## 2015-08-30 DIAGNOSIS — G629 Polyneuropathy, unspecified: Secondary | ICD-10-CM | POA: Diagnosis not present

## 2015-08-30 DIAGNOSIS — L97829 Non-pressure chronic ulcer of other part of left lower leg with unspecified severity: Secondary | ICD-10-CM | POA: Diagnosis not present

## 2015-08-30 DIAGNOSIS — M353 Polymyalgia rheumatica: Secondary | ICD-10-CM | POA: Diagnosis not present

## 2015-09-02 ENCOUNTER — Emergency Department (HOSPITAL_COMMUNITY)
Admission: EM | Admit: 2015-09-02 | Discharge: 2015-09-05 | Disposition: A | Discharge status: Other Acute Inpatient Hospital | Payer: Medicare Other | Attending: Emergency Medicine | Admitting: Emergency Medicine

## 2015-09-02 ENCOUNTER — Emergency Department (HOSPITAL_COMMUNITY): Payer: Medicare Other

## 2015-09-02 ENCOUNTER — Encounter (HOSPITAL_COMMUNITY): Payer: Self-pay | Admitting: *Deleted

## 2015-09-02 DIAGNOSIS — R443 Hallucinations, unspecified: Secondary | ICD-10-CM | POA: Insufficient documentation

## 2015-09-02 DIAGNOSIS — E785 Hyperlipidemia, unspecified: Secondary | ICD-10-CM | POA: Insufficient documentation

## 2015-09-02 DIAGNOSIS — R4182 Altered mental status, unspecified: Secondary | ICD-10-CM | POA: Diagnosis not present

## 2015-09-02 DIAGNOSIS — R441 Visual hallucinations: Secondary | ICD-10-CM | POA: Diagnosis not present

## 2015-09-02 DIAGNOSIS — F0391 Unspecified dementia with behavioral disturbance: Secondary | ICD-10-CM | POA: Insufficient documentation

## 2015-09-02 DIAGNOSIS — M353 Polymyalgia rheumatica: Secondary | ICD-10-CM | POA: Diagnosis not present

## 2015-09-02 DIAGNOSIS — L97829 Non-pressure chronic ulcer of other part of left lower leg with unspecified severity: Secondary | ICD-10-CM | POA: Diagnosis not present

## 2015-09-02 DIAGNOSIS — Z79899 Other long term (current) drug therapy: Secondary | ICD-10-CM | POA: Insufficient documentation

## 2015-09-02 DIAGNOSIS — S299XXA Unspecified injury of thorax, initial encounter: Secondary | ICD-10-CM | POA: Diagnosis not present

## 2015-09-02 DIAGNOSIS — I10 Essential (primary) hypertension: Secondary | ICD-10-CM | POA: Diagnosis not present

## 2015-09-02 DIAGNOSIS — F03918 Unspecified dementia, unspecified severity, with other behavioral disturbance: Secondary | ICD-10-CM | POA: Diagnosis present

## 2015-09-02 DIAGNOSIS — G629 Polyneuropathy, unspecified: Secondary | ICD-10-CM | POA: Diagnosis not present

## 2015-09-02 DIAGNOSIS — I872 Venous insufficiency (chronic) (peripheral): Secondary | ICD-10-CM | POA: Diagnosis not present

## 2015-09-02 DIAGNOSIS — L989 Disorder of the skin and subcutaneous tissue, unspecified: Secondary | ICD-10-CM | POA: Diagnosis not present

## 2015-09-02 LAB — CBC WITH DIFFERENTIAL/PLATELET
BASOS ABS: 0 10*3/uL (ref 0.0–0.1)
BASOS PCT: 0 %
Eosinophils Absolute: 0 10*3/uL (ref 0.0–0.7)
Eosinophils Relative: 0 %
HEMATOCRIT: 35.2 % — AB (ref 36.0–46.0)
Hemoglobin: 11.8 g/dL — ABNORMAL LOW (ref 12.0–15.0)
LYMPHS PCT: 22 %
Lymphs Abs: 1.4 10*3/uL (ref 0.7–4.0)
MCH: 32.1 pg (ref 26.0–34.0)
MCHC: 33.5 g/dL (ref 30.0–36.0)
MCV: 95.7 fL (ref 78.0–100.0)
MONO ABS: 0.8 10*3/uL (ref 0.1–1.0)
MONOS PCT: 13 %
Neutro Abs: 4.1 10*3/uL (ref 1.7–7.7)
Neutrophils Relative %: 65 %
Platelets: 206 10*3/uL (ref 150–400)
RBC: 3.68 MIL/uL — ABNORMAL LOW (ref 3.87–5.11)
RDW: 14.2 % (ref 11.5–15.5)
WBC: 6.4 10*3/uL (ref 4.0–10.5)

## 2015-09-02 LAB — COMPREHENSIVE METABOLIC PANEL
ALT: 13 U/L — ABNORMAL LOW (ref 14–54)
AST: 21 U/L (ref 15–41)
Albumin: 4.1 g/dL (ref 3.5–5.0)
Alkaline Phosphatase: 46 U/L (ref 38–126)
Anion gap: 8 (ref 5–15)
BUN: 30 mg/dL — ABNORMAL HIGH (ref 6–20)
CO2: 25 mmol/L (ref 22–32)
Calcium: 9.7 mg/dL (ref 8.9–10.3)
Chloride: 107 mmol/L (ref 101–111)
Creatinine, Ser: 1.14 mg/dL — ABNORMAL HIGH (ref 0.44–1.00)
GFR calc non Af Amer: 40 mL/min — ABNORMAL LOW (ref >60)
GFR, EST AFRICAN AMERICAN: 47 mL/min — AB (ref >60)
GLUCOSE: 121 mg/dL — AB (ref 65–99)
POTASSIUM: 4.2 mmol/L (ref 3.5–5.1)
Sodium: 140 mmol/L (ref 135–145)
TOTAL PROTEIN: 7.7 g/dL (ref 6.5–8.1)
Total Bilirubin: 0.8 mg/dL (ref 0.3–1.2)

## 2015-09-02 LAB — URINALYSIS, ROUTINE W REFLEX MICROSCOPIC
Bilirubin Urine: NEGATIVE
GLUCOSE, UA: NEGATIVE mg/dL
Hgb urine dipstick: NEGATIVE
Ketones, ur: NEGATIVE mg/dL
LEUKOCYTES UA: NEGATIVE
NITRITE: NEGATIVE
Protein, ur: NEGATIVE mg/dL
SPECIFIC GRAVITY, URINE: 1.009 (ref 1.005–1.030)
pH: 5.5 (ref 5.0–8.0)

## 2015-09-02 MED ORDER — DORZOLAMIDE HCL 2 % OP SOLN
1.0000 [drp] | Freq: Two times a day (BID) | OPHTHALMIC | Status: DC
  Administered 2015-09-03 – 2015-09-05 (×3): 1 [drp] via OPHTHALMIC
  Filled 2015-09-02: qty 10

## 2015-09-02 MED ORDER — RAMIPRIL 5 MG PO CAPS
5.0000 mg | ORAL_CAPSULE | Freq: Every evening | ORAL | Status: DC
  Administered 2015-09-02: 5 mg via ORAL
  Filled 2015-09-02 (×4): qty 1

## 2015-09-02 MED ORDER — ONDANSETRON HCL 4 MG PO TABS
4.0000 mg | ORAL_TABLET | Freq: Three times a day (TID) | ORAL | Status: DC | PRN
  Filled 2015-09-02: qty 1

## 2015-09-02 MED ORDER — LATANOPROST 0.005 % OP SOLN
1.0000 [drp] | Freq: Every day | OPHTHALMIC | Status: DC
  Administered 2015-09-03 – 2015-09-05 (×3): 1 [drp] via OPHTHALMIC
  Filled 2015-09-02: qty 2.5

## 2015-09-02 MED ORDER — PILOCARPINE HCL 4 % OP SOLN
1.0000 [drp] | Freq: Four times a day (QID) | OPHTHALMIC | Status: DC
  Administered 2015-09-03 – 2015-09-05 (×6): 1 [drp] via OPHTHALMIC
  Filled 2015-09-02: qty 15

## 2015-09-02 MED ORDER — PREDNISONE 5 MG PO TABS
5.0000 mg | ORAL_TABLET | Freq: Every day | ORAL | Status: DC
  Administered 2015-09-03 – 2015-09-05 (×3): 5 mg via ORAL
  Filled 2015-09-02 (×4): qty 1

## 2015-09-02 MED ORDER — MECLIZINE HCL 25 MG PO TABS
12.5000 mg | ORAL_TABLET | Freq: Three times a day (TID) | ORAL | Status: DC | PRN
  Administered 2015-09-04: 12.5 mg via ORAL
  Filled 2015-09-02 (×2): qty 1

## 2015-09-02 MED ORDER — LORAZEPAM 1 MG PO TABS
1.0000 mg | ORAL_TABLET | Freq: Three times a day (TID) | ORAL | Status: DC | PRN
  Administered 2015-09-03: 1 mg via ORAL
  Filled 2015-09-02 (×2): qty 1

## 2015-09-02 MED ORDER — AMLODIPINE BESYLATE 5 MG PO TABS
5.0000 mg | ORAL_TABLET | ORAL | Status: DC
  Administered 2015-09-02: 5 mg via ORAL
  Filled 2015-09-02 (×2): qty 1

## 2015-09-02 MED ORDER — RAMIPRIL 10 MG PO CAPS
10.0000 mg | ORAL_CAPSULE | Freq: Every morning | ORAL | Status: DC
  Administered 2015-09-03 – 2015-09-05 (×3): 10 mg via ORAL
  Filled 2015-09-02 (×4): qty 1

## 2015-09-02 MED ORDER — ALUM & MAG HYDROXIDE-SIMETH 200-200-20 MG/5ML PO SUSP: 30.0000 mL | ORAL | Status: DC | PRN

## 2015-09-02 MED ORDER — BRIMONIDINE TARTRATE 0.2 % OP SOLN
1.0000 [drp] | Freq: Two times a day (BID) | OPHTHALMIC | Status: DC
  Administered 2015-09-03 – 2015-09-05 (×3): 1 [drp] via OPHTHALMIC
  Filled 2015-09-02: qty 5

## 2015-09-02 MED ORDER — ACETAMINOPHEN 325 MG PO TABS: 650.0000 mg | ORAL_TABLET | ORAL | Status: DC | PRN

## 2015-09-02 MED ORDER — ZOLPIDEM TARTRATE 5 MG PO TABS: 5.0000 mg | ORAL_TABLET | Freq: Every evening | ORAL | Status: DC | PRN

## 2015-09-02 MED ORDER — NEBIVOLOL HCL 5 MG PO TABS
5.0000 mg | ORAL_TABLET | Freq: Every day | ORAL | Status: DC
  Administered 2015-09-02 – 2015-09-05 (×3): 5 mg via ORAL
  Filled 2015-09-02 (×4): qty 1

## 2015-09-02 MED ORDER — FUROSEMIDE 20 MG PO TABS
20.0000 mg | ORAL_TABLET | ORAL | Status: DC
  Administered 2015-09-04: 20 mg via ORAL
  Filled 2015-09-02: qty 1

## 2015-09-02 NOTE — BH Assessment (Addendum)
Assessment Note  Joanne Anderson is a 82 y.o. female who presents voluntarily to [facility], accompanied by her son Pascuala Zhen) & grandson Kaitlain, Wesselmann) due to abnormal behavior. Pt suffers from macular degeneration and hearing loss and was a poor historian. Majority of assessment is collateral information from son and grandson. They share that, progressively, pt has been having AVH, where she will call them and vividly describe people that have allegedly been in her house and moving boxes, painting walls, and several other things. They indicate that pt lives alone and no one else is in her home. They share that pt is not sleeping due to walking around all night telling the "people" in her home to leave. She reportedly is not eating much, either.They report that they bought pt to the ED today b/c she walked across the street to a neighbor's home and appeared frightened or out of sorts. They indicate that the fact that pt walked across the street was "amazing" due to her limitations in walking. They also report that pt has started to have mood swings. They add that pt has had very little medication changes over the years.   Diagnosis: Major neurocognitive disorder due to multiple etiologies, Without behavioral disturbance, provisional  Past Medical History:  Past Medical History  Diagnosis Date  . Hypertension   . Peripheral neuropathy ([place])   . Hyperlipidemia     History reviewed. No pertinent past surgical history.  Family History: No family history on file.  Social History:  reports that she has never smoked. She has never used smokeless tobacco. She reports that she does not drink alcohol or use illicit drugs.  Additional Social History:  Alcohol / Drug Use Pain Medications: see PTA meds Prescriptions: see PTA meds Over the Counter: see PTA meds History of alcohol / drug use?: No history of alcohol / drug abuse  CIWA: CIWA-Ar BP: 129/80 mmHg Pulse Rate: 78 COWS:    Allergies:  Allergies   Allergen Reactions  . Codeine Anaphylaxis  . Nsaids Nausea And Vomiting  . Gabapentin Nausea Only    Other Reaction: Nausea; pain  . Other Other (See Comments)    Patient states that she is allergic to all pain medications but doesn't state the which ones. Patient states that she's very sick"nausea and vomiting"    Home Medications:  (Not in a hospital admission)  OB/GYN Status:  No LMP recorded. Patient has had a hysterectomy.  General Assessment Data Location of Assessment: [facility] ED TTS Assessment: In system Is this a Tele or Face-to-Face Assessment?: Face-to-Face Is this an Initial Assessment or a Re-assessment for this encounter?: Initial Assessment Is patient pregnant?: No Pregnancy Status: No Living Arrangements: Alone Can pt return to current living arrangement?: Yes Admission Status: Voluntary Is patient capable of signing voluntary admission?: Yes Referral Source: Self/Family/Friend Insurance type: Medicare     Crisis Care Plan Living Arrangements: Alone Name of Psychiatrist: none Name of Therapist: none  Education Status Is patient currently in school?: No  Risk to self with the past 6 months Suicidal Ideation: No Has patient been a risk to self within the past 6 months prior to admission? : No Suicidal Intent: No Has patient had any suicidal intent within the past 6 months prior to admission? : No Is patient at risk for suicide?: No Suicidal Plan?: No Has patient had any suicidal plan within the past 6 months prior to admission? : No Access to Means: No What has been your use of drugs/alcohol  within the last 12 months?: none Previous Attempts/Gestures: No Intentional Self Injurious Behavior: None Family Suicide History: Unknown Persecutory voices/beliefs?: No Depression: No Substance abuse history and/or treatment for substance abuse?: No Suicide prevention information given to non-admitted patients: Not applicable  Risk to Others within the past 6  months Homicidal Ideation: No Does patient have any lifetime risk of violence toward others beyond the six months prior to admission? : No Thoughts of Harm to Others: No Current Homicidal Intent: No Current Homicidal Plan: No Access to Homicidal Means: No History of harm to others?: No Assessment of Violence: None Noted Does patient have access to weapons?: No Criminal Charges Pending?: No Does patient have a court date: No Is patient on probation?: No  Psychosis Hallucinations: Visual, Auditory Delusions: Unspecified  Mental Status Report Appearance/Hygiene: Unremarkable Eye Contact: Fair Motor Activity: Unremarkable Speech: Logical/coherent, Soft Level of Consciousness: Alert Mood: Pleasant Affect: Appropriate to circumstance Anxiety Level: None Thought Processes: Unable to Assess Judgement: Unable to Assess Orientation: Person Obsessive Compulsive Thoughts/Behaviors: None  Cognitive Functioning Concentration: Normal Memory: Unable to Assess IQ: Average Insight: see judgement above Impulse Control: Good Appetite: Poor Sleep: Decreased Vegetative Symptoms: None  ADLScreening [facility] Assessment Services) Patient's cognitive ability adequate to safely complete daily activities?: No Patient able to express need for assistance with ADLs?: Yes Independently performs ADLs?: Yes (appropriate for developmental age)  Prior Inpatient Therapy Prior Inpatient Therapy: No  Prior Outpatient Therapy Prior Outpatient Therapy: No Does patient have an ACCT team?: No Does patient have Intensive In-House Services?  : No Does patient have [facility] services? : No Does patient have P4CC services?: No  ADL Screening (condition at time of admission) Patient's cognitive ability adequate to safely complete daily activities?: No Is the patient deaf or have difficulty hearing?: Yes Does the patient have difficulty seeing, even when wearing glasses/contacts?: Yes Does the patient have  difficulty concentrating, remembering, or making decisions?: Yes Patient able to express need for assistance with ADLs?: Yes Does the patient have difficulty dressing or bathing?: No Independently performs ADLs?: Yes (appropriate for developmental age) Does the patient have difficulty walking or climbing stairs?: Yes Weakness of Legs: Both Weakness of Arms/Hands: Both  Home Assistive Devices/Equipment Home Assistive Devices/Equipment: Cane (specify quad or straight)  Therapy Consults (therapy consults require a physician order) PT Evaluation Needed: No OT Evalulation Needed: No SLP Evaluation Needed: No Abuse/Neglect Assessment (Assessment to be complete while patient is alone) Physical Abuse: Denies Verbal Abuse: Denies Sexual Abuse: Denies Exploitation of patient/patient's resources: Denies Self-Neglect: Denies Values / Beliefs Cultural Requests During Hospitalization: None Spiritual Requests During Hospitalization: None Consults Spiritual Care Consult Needed: No Social Work Consult Needed: No Regulatory affairs officer (For Healthcare) Does patient have an advance directive?: No Would patient like information on creating an advanced directive?: No - patient declined information    Additional Information 1:1 In Past 12 Months?: No CIRT Risk: No Elopement Risk: No Does patient have medical clearance?: Yes     Disposition:  Disposition Initial Assessment Completed for this Encounter: Yes Disposition of Patient: Inpatient treatment program (consulted with Waylan Boga, DNP) Type of inpatient treatment program: Adult (TTS to seek for gero-psych placement)  On Site Evaluation by:   Reviewed with Physician:    Rexene Edison 09/02/2015 6:47 PM

## 2015-09-02 NOTE — Progress Notes (Signed)
CSW met with patient at bedside. Family was present. Patient is hard of hearing. Majority of information was given by grandson.  Family states that pt needs assistance with completing ADL's and that they are interested in patient eventually going to an ALF due to patient not being able to effectively care for herself at home. Son and grandson state that patient live alone in [place]. Yolanda Bonine states that pt has fallen a couple of times within the past 6 months. He reports that pt ambulates using a walker at all times. CSW informed family that note states recommendation for pt is inpatient treatment a this time.  Family states that they are interested in ALF information. CSW provided family with a list of ALF facilities.  Willette Brace [phone] ED CSW 09/02/2015 10:23 PM

## 2015-09-02 NOTE — ED Notes (Signed)
Social work in room.  

## 2015-09-02 NOTE — ED Notes (Addendum)
Pt's grandson states he was called due to pt acting abnormal this morning. Pt's neighbors state the pt went to their house, knocking on the door his door. Pt's grandson states the pt believes someone is trying to steal her house. Pt grandson states the pt appears more upset than normal. Pt has not been diagnosed dementia, but grandson states she has said some "off" things in the past but not to this extent.   Pt states she fell last night, denies loss of consciousness or head injury.

## 2015-09-02 NOTE — ED Notes (Signed)
Called pharmacy for med verification

## 2015-09-02 NOTE — ED Notes (Signed)
Pt shown in psych scrubs and wand ed by security. Person items at nurse station. Pt was hand held apple sauce by nurse and is calm and cooperative  Social work in room.

## 2015-09-02 NOTE — ED Notes (Signed)
Pt has been dressed out and wanded by security. Pt's family took her belongings home with them.

## 2015-09-02 NOTE — ED Provider Notes (Signed)
CSN: [id number]     Arrival date & time 09/02/15  1346 History   First MD Initiated Contact with Patient 09/02/15 1436     Chief Complaint  Patient presents with  . Altered Mental Status      HPI  Expand All Collapse All   Pt's grandson states he was called due to pt acting abnormal this morning. Pt's neighbors state the pt went to their house, knocking on the door his door. Pt's grandson states the pt believes someone is trying to steal her house. Pt grandson states the pt appears more upset than normal. Pt has not been diagnosed dementia, but grandson states she has said some "off" things in the past but not to this extent.   Pt states she fell last night, denies loss of consciousness or head injury.        Past Medical History  Diagnosis Date  . Hypertension   . Peripheral neuropathy ([place])   . Hyperlipidemia    History reviewed. No pertinent past surgical history. No family history on file. Social History  Substance Use Topics  . Smoking status: Never Smoker   . Smokeless tobacco: Never Used  . Alcohol Use: No   OB History    No data available     Review of Systems  Unable to perform ROS: Dementia      Allergies  Codeine; Nsaids; Gabapentin; and Other  Home Medications   Prior to Admission medications   Medication Sig Start Date End Date Taking? Authorizing Provider  amLODipine (NORVASC) 5 MG tablet Take 5 mg by mouth every Monday, Wednesday, and Friday.    Yes Historical Provider, MD  brimonidine (ALPHAGAN) 0.2 % ophthalmic solution Place 1 drop into both eyes 2 (two) times daily.    Yes Historical Provider, MD  BYSTOLIC 10 MG tablet Take 5 mg by mouth daily.  08/24/15  Yes Historical Provider, MD  dorzolamide (TRUSOPT) 2 % ophthalmic solution Place 1 drop into both eyes 2 (two) times daily.    Yes Historical Provider, MD  furosemide (LASIX) 20 MG tablet Take 20 mg by mouth every other day. 08/29/15  Yes Historical Provider, MD  latanoprost (XALATAN) 0.005 %  ophthalmic solution Place 1 drop into both eyes daily.    Yes Historical Provider, MD  lovastatin (MEVACOR) 10 MG tablet Take 5 mg by mouth daily.  08/24/15  Yes Historical Provider, MD  lovastatin (MEVACOR) 20 MG tablet Take 20 mg by mouth at bedtime.   Yes Historical Provider, MD  meclizine (ANTIVERT) 25 MG tablet Take 25 mg by mouth daily as needed. dizzyness 08/19/11  Yes Historical Provider, MD  moxifloxacin (AVELOX) 400 MG tablet Take 400 mg by mouth daily. 08/29/15  Yes Historical Provider, MD  pilocarpine (PILOCAR) 4 % ophthalmic solution Place 1 drop into the left eye 4 (four) times daily.    Yes Historical Provider, MD  predniSONE (DELTASONE) 5 MG tablet Take 5 mg by mouth daily. 06/08/15  Yes Historical Provider, MD  RA SENNA PLUS 8.6-50 MG tablet Take 1 tablet by mouth every evening. 08/24/15  Yes Historical Provider, MD  ramipril (ALTACE) 10 MG capsule Take 10 mg by mouth every morning.    Yes Historical Provider, MD  ramipril (ALTACE) 5 MG capsule Take 5 mg by mouth every evening. 09/01/15  Yes Historical Provider, MD   BP 126/86 mmHg  Pulse 78  Temp(Src) 98.1 F (36.7 C) (Oral)  Resp 16  SpO2 98% Physical Exam  Constitutional: She appears well-developed and  well-nourished. No distress.  HENT:  Head: Normocephalic and atraumatic.  Eyes: Pupils are equal, round, and reactive to light.  Neck: Normal range of motion.  Cardiovascular: Normal rate and intact distal pulses.   Pulmonary/Chest: No respiratory distress.  Abdominal: Normal appearance. She exhibits no distension. There is no rebound.  Musculoskeletal: Normal range of motion.  Neurological: She is alert. No cranial nerve deficit.  Skin: Skin is warm and dry. No rash noted.  Psychiatric: Her mood appears anxious. Thought content is delusional. She exhibits normal recent memory.  Nursing note and vitals reviewed.   ED Course  Procedures (including critical care time) Labs Review Labs Reviewed  COMPREHENSIVE METABOLIC  PANEL - Abnormal; Notable for the following:    Glucose, Bld 121 (*)    BUN 30 (*)    Creatinine, Ser 1.14 (*)    ALT 13 (*)    GFR calc non Af Amer 40 (*)    GFR calc Af Amer 47 (*)    All other components within normal limits  CBC WITH DIFFERENTIAL/PLATELET - Abnormal; Notable for the following:    RBC 3.68 (*)    Hemoglobin 11.8 (*)    HCT 35.2 (*)    All other components within normal limits  URINE CULTURE  URINALYSIS, ROUTINE W REFLEX MICROSCOPIC (NOT AT [facility])    Imaging Review Dg Chest 2 View  09/02/2015  CLINICAL DATA:  Fall last night, head injury EXAM: CHEST  2 VIEW COMPARISON:  02/18/2015 FINDINGS: Cardiomediastinal silhouette is stable. No acute infiltrate or pleural effusion. No pulmonary edema. Stable old fracture deformity of the right proximal humerus. Again noted kyphosis of lower thoracic spine. Stable compression fractures of T8-T10, T12 and L1 vertebral body. There is mild compression deformity T6 vertebral body new from prior exam. Diffuse osteopenia. IMPRESSION: No acute infiltrate or pulmonary edema. Stable compression deformities lower thoracic spine. New mild compression deformity T6 vertebral body. This is of indeterminate age. Clinical correlation is necessary. Electronically Signed   By: Lahoma Crocker M.D.   On: 09/02/2015 15:29   Ct Head Wo Contrast  09/02/2015  CLINICAL DATA:  Altered mental status. EXAM: CT HEAD WITHOUT CONTRAST TECHNIQUE: Contiguous axial images were obtained from the base of the skull through the vertex without intravenous contrast. COMPARISON:  12/06/2006 FINDINGS: Mild age related volume loss. No acute intracranial abnormality. Specifically, no hemorrhage, hydrocephalus, mass lesion, acute infarction, or significant intracranial injury. No acute calvarial abnormality. Visualized paranasal sinuses and mastoids clear. Orbital soft tissues unremarkable. IMPRESSION: No acute intracranial abnormality. Electronically Signed   By: Rolm Baptise M.D.   On:  09/02/2015 15:11   I have personally reviewed and evaluated these images and lab results as part of my medical decision-making.   I did discuss her case with her primary care doctor for Dr. Maudie Mercury.  He recommended admission to psychiatric unit for further evaluation. We'll transfer to the psych unit and obtain a psychiatric consult.  Also have consult to the social worker to evaluate home situation prior to discharge.  MDM   Final diagnoses:  Hallucination, visual        Leonard Schwartz, MD 09/02/15 2318

## 2015-09-03 DIAGNOSIS — F0391 Unspecified dementia with behavioral disturbance: Secondary | ICD-10-CM | POA: Diagnosis not present

## 2015-09-03 DIAGNOSIS — F03918 Unspecified dementia, unspecified severity, with other behavioral disturbance: Secondary | ICD-10-CM | POA: Diagnosis present

## 2015-09-03 DIAGNOSIS — R443 Hallucinations, unspecified: Secondary | ICD-10-CM | POA: Diagnosis not present

## 2015-09-03 LAB — URINE CULTURE: Culture: NO GROWTH

## 2015-09-03 MED ORDER — TRAZODONE HCL 50 MG PO TABS: 50.0000 mg | ORAL_TABLET | Freq: Every evening | ORAL | Status: DC | PRN

## 2015-09-03 MED ORDER — CITALOPRAM HYDROBROMIDE 10 MG PO TABS
10.0000 mg | ORAL_TABLET | Freq: Every day | ORAL | Status: DC
Start: 2015-09-04 — End: 2015-09-05
  Administered 2015-09-04 – 2015-09-05 (×2): 10 mg via ORAL
  Filled 2015-09-03 (×3): qty 1

## 2015-09-03 NOTE — Progress Notes (Signed)
[facility] reached out to [facility]. Staff states that patient is currently under review.  Joanne Anderson [id number] ED CSW 09/03/2015 9:06 PM

## 2015-09-03 NOTE — Progress Notes (Signed)
EDCM noted patient 82 year old female living at home alone with bizarre behavior. EDCM went to speak to patient at bedside, however patient is sound asleep.  Per chart review patient is confused and very hard of hearing.  EDCM called and spoke to patient's grandson Zenia Resides [phone].  Zenia Resides reports that he lives close to patient and checks on her multiple times a week.  He reports patient's son does not live in [place].  Per Zenia Resides, "We know she has no business living at home alone."  He reports patient is currently on the waiting list at a few facilities.  EDCM informed patient's grand son that the following resources were placed in patient's folder at nurse's station:  Tax adviser, private duty nursing agency list (explained out of pocket expense), and choice connections (senior placement assistance company).  Patient's grandson thankful for resources and reports they would really like patient to go to a facility.  This patient would not be a candidate for home health services as she does not have a caregiver.  No further EDCM needs at this time.

## 2015-09-03 NOTE — Consult Note (Signed)
[facility] Psychiatry Consult   Reason for Consult:  Aggressive behavior, paranoid thoughts Referring Physician:  EDP Patient Identification: Joanne Anderson MRN:  [id number] Principal Diagnosis: Dementia with aggressive behavior Diagnosis:   Patient Active Problem List   Diagnosis Date Noted  . Dementia with aggressive behavior [F03.91] 09/03/2015    Priority: High    Total Time spent with patient: 25 minutes  Subjective:   [name] is a 82 y.o. female patient admitted with reports of aggressive behavior, risky behaviors wandering around and getting lost, and paranoid ideations that someone is trying to steal her house. When assessed, pt was disoriented yet not delirious, in such a way that is consistent with dementia. No acute organic etiology is evident at this time. Pt was limited in her responses. Will seek geropsych.  HPI:  I have reviewed and concur with HPI elements below, modified as follows:  KATISHA Anderson is a 82 y.o. female who presents voluntarily to [facility], accompanied by her son Demica Zook) & grandson Kathya, Wilz) due to abnormal behavior. Pt suffers from macular degeneration and hearing loss and was a poor historian. Majority of assessment is collateral information from son and grandson. They share that, progressively, pt has been having AVH, where she will call them and vividly describe people that have allegedly been in her house and moving boxes, painting walls, and several other things. They indicate that pt lives alone and no one else is in her home. They share that pt is not sleeping due to walking around all night telling the "people" in her home to leave. She reportedly is not eating much, either.They report that they bought pt to the ED today b/c she walked across the street to a neighbor's home and appeared frightened or out of sorts. They indicate that the fact that pt walked across the street was "amazing" due to her limitations in walking. They also report  that pt has started to have mood swings. They add that pt has had very little medication changes over the years.   Pt spent the night in the ED without incident. Pt continues to present as disoriented in a way that is likely consistent with dementia. Meets geropsych criteria at this time.   Past Psychiatric History: MDD  Risk to Self: Suicidal Ideation: No Suicidal Intent: No Is patient at risk for suicide?: Yes (attention seeking ) Suicidal Plan?: No Access to Means: No What has been your use of drugs/alcohol within the last 12 months?: none Intentional Self Injurious Behavior: None Risk to Others: Homicidal Ideation: No Thoughts of Harm to Others: No Current Homicidal Intent: No Current Homicidal Plan: No Access to Homicidal Means: No History of harm to others?: No Assessment of Violence: None Noted Does patient have access to weapons?: No Criminal Charges Pending?: No Does patient have a court date: No Prior Inpatient Therapy: Prior Inpatient Therapy: No Prior Outpatient Therapy: Prior Outpatient Therapy: No Does patient have an ACCT team?: No Does patient have Intensive In-House Services?  : No Does patient have [facility] services? : No Does patient have P4CC services?: No  Past Medical History:  Past Medical History  Diagnosis Date  . Hypertension   . Peripheral neuropathy ([place])   . Hyperlipidemia    History reviewed. No pertinent past surgical history. Family History: No family history on file. Family Psychiatric  History: MDD Social History:  History  Alcohol Use No     History  Drug Use No    Social History  Social History  . Marital Status: Widowed    Spouse Name: N/A  . Number of Children: N/A  . Years of Education: N/A   Social History Main Topics  . Smoking status: Never Smoker   . Smokeless tobacco: Never Used  . Alcohol Use: No  . Drug Use: No  . Sexual Activity: Not Asked   Other Topics Concern  . None   Social History Narrative    Additional Social History:    Allergies:   Allergies  Allergen Reactions  . Codeine Anaphylaxis  . Nsaids Nausea And Vomiting  . Gabapentin Nausea Only    Other Reaction: Nausea; pain  . Other Other (See Comments)    Patient states that she is allergic to all pain medications but doesn't state the which ones. Patient states that she's very sick"nausea and vomiting"    Labs:  Results for orders placed or performed during the hospital encounter of 09/02/15 (from the past 48 hour(s))  Comprehensive metabolic panel     Status: Abnormal   Collection Time: 09/02/15  2:52 PM  Result Value Ref Range   Sodium 140 135 - 145 mmol/L   Potassium 4.2 3.5 - 5.1 mmol/L   Chloride 107 101 - 111 mmol/L   CO2 25 22 - 32 mmol/L   Glucose, Bld 121 (H) 65 - 99 mg/dL   BUN 30 (H) 6 - 20 mg/dL   Creatinine, Ser 1.14 (H) 0.44 - 1.00 mg/dL   Calcium 9.7 8.9 - 10.3 mg/dL   Total Protein 7.7 6.5 - 8.1 g/dL   Albumin 4.1 3.5 - 5.0 g/dL   AST 21 15 - 41 U/L   ALT 13 (L) 14 - 54 U/L   Alkaline Phosphatase 46 38 - 126 U/L   Total Bilirubin 0.8 0.3 - 1.2 mg/dL   GFR calc non Af Amer 40 (L) >60 mL/min   GFR calc Af Amer 47 (L) >60 mL/min    Comment: (NOTE) The eGFR has been calculated using the CKD EPI equation. This calculation has not been validated in all clinical situations. eGFR's persistently <60 mL/min signify possible Chronic Kidney Disease.    Anion gap 8 5 - 15  CBC with Differential/Platelet     Status: Abnormal   Collection Time: 09/02/15  2:52 PM  Result Value Ref Range   WBC 6.4 4.0 - 10.5 K/uL   RBC 3.68 (L) 3.87 - 5.11 MIL/uL   Hemoglobin 11.8 (L) 12.0 - 15.0 g/dL   HCT 35.2 (L) 36.0 - 46.0 %   MCV 95.7 78.0 - 100.0 fL   MCH 32.1 26.0 - 34.0 pg   MCHC 33.5 30.0 - 36.0 g/dL   RDW 14.2 11.5 - 15.5 %   Platelets 206 150 - 400 K/uL   Neutrophils Relative % 65 %   Neutro Abs 4.1 1.7 - 7.7 K/uL   Lymphocytes Relative 22 %   Lymphs Abs 1.4 0.7 - 4.0 K/uL   Monocytes Relative 13 %    Monocytes Absolute 0.8 0.1 - 1.0 K/uL   Eosinophils Relative 0 %   Eosinophils Absolute 0.0 0.0 - 0.7 K/uL   Basophils Relative 0 %   Basophils Absolute 0.0 0.0 - 0.1 K/uL  Urinalysis, Routine w reflex microscopic (not at [facility])     Status: None   Collection Time: 09/02/15  4:00 PM  Result Value Ref Range   Color, Urine YELLOW YELLOW   APPearance CLEAR CLEAR   Specific Gravity, Urine 1.009 1.005 - 1.030   pH  5.5 5.0 - 8.0   Glucose, UA NEGATIVE NEGATIVE mg/dL   Hgb urine dipstick NEGATIVE NEGATIVE   Bilirubin Urine NEGATIVE NEGATIVE   Ketones, ur NEGATIVE NEGATIVE mg/dL   Protein, ur NEGATIVE NEGATIVE mg/dL   Nitrite NEGATIVE NEGATIVE   Leukocytes, UA NEGATIVE NEGATIVE    Comment: MICROSCOPIC NOT DONE ON URINES WITH NEGATIVE PROTEIN, BLOOD, LEUKOCYTES, NITRITE, OR GLUCOSE <1000 mg/dL.  Urine culture     Status: None   Collection Time: 09/02/15  4:00 PM  Result Value Ref Range   Specimen Description URINE, CLEAN CATCH    Special Requests NONE    Culture NO GROWTH Performed at [facility]     Report Status 09/03/2015 FINAL     Current Facility-Administered Medications  Medication Dose Route Frequency Provider Last Rate Last Dose  . acetaminophen (TYLENOL) tablet 650 mg  650 mg Oral Q4H PRN Leonard Schwartz, MD      . alum & mag hydroxide-simeth (MAALOX/MYLANTA) 200-200-20 MG/5ML suspension 30 mL  30 mL Oral PRN Leonard Schwartz, MD      . amLODipine (NORVASC) tablet 5 mg  5 mg Oral Q M,W,F Leonard Schwartz, MD   5 mg at 09/02/15 2225  . brimonidine (ALPHAGAN) 0.2 % ophthalmic solution 1 drop  1 drop Both Eyes BID Leonard Schwartz, MD   1 drop at 09/03/15 1032  . [START ON 09/04/2015] citalopram (CELEXA) tablet 10 mg  10 mg Oral Daily Jemaine Prokop, MD      . dorzolamide (TRUSOPT) 2 % ophthalmic solution 1 drop  1 drop Both Eyes BID Leonard Schwartz, MD   1 drop at 09/03/15 1032  . furosemide (LASIX) tablet 20 mg  20 mg Oral Jonelle Sports, MD   20 mg at 09/03/15 7169  .  latanoprost (XALATAN) 0.005 % ophthalmic solution 1 drop  1 drop Both Eyes Daily Leonard Schwartz, MD   1 drop at 09/03/15 1033  . LORazepam (ATIVAN) tablet 1 mg  1 mg Oral Q8H PRN Leonard Schwartz, MD   1 mg at 09/03/15 1440  . meclizine (ANTIVERT) tablet 12.5 mg  12.5 mg Oral TID PRN Leonard Schwartz, MD      . nebivolol (BYSTOLIC) tablet 5 mg  5 mg Oral Daily Leonard Schwartz, MD   5 mg at 09/03/15 0916  . ondansetron (ZOFRAN) tablet 4 mg  4 mg Oral Q8H PRN Leonard Schwartz, MD      . pilocarpine (PILOCAR) 4 % ophthalmic solution 1 drop  1 drop Left Eye QID Leonard Schwartz, MD   1 drop at 09/03/15 1441  . predniSONE (DELTASONE) tablet 5 mg  5 mg Oral Daily Leonard Schwartz, MD   5 mg at 09/03/15 0916  . ramipril (ALTACE) capsule 10 mg  10 mg Oral q morning - 10a Leonard Schwartz, MD   10 mg at 09/03/15 0916  . ramipril (ALTACE) capsule 5 mg  5 mg Oral QPM Leonard Schwartz, MD   5 mg at 09/02/15 2221  . traZODone (DESYREL) tablet 50 mg  50 mg Oral QHS PRN Corena Pilgrim, MD       Current Outpatient Prescriptions  Medication Sig Dispense Refill  . amLODipine (NORVASC) 5 MG tablet Take 5 mg by mouth every Monday, Wednesday, and Friday.     . brimonidine (ALPHAGAN) 0.2 % ophthalmic solution Place 1 drop into both eyes 2 (two) times daily.     Mar[place] Kitchen BYSTOLIC 10 MG tablet Take 5 mg by mouth daily.   0  . dorzolamide (TRUSOPT) 2 %  ophthalmic solution Place 1 drop into both eyes 2 (two) times daily.     . furosemide (LASIX) 20 MG tablet Take 20 mg by mouth every other day.  0  . latanoprost (XALATAN) 0.005 % ophthalmic solution Place 1 drop into both eyes daily.     Mar[place] Kitchen lovastatin (MEVACOR) 10 MG tablet Take 5 mg by mouth daily.   1  . lovastatin (MEVACOR) 20 MG tablet Take 20 mg by mouth at bedtime.    . meclizine (ANTIVERT) 25 MG tablet Take 25 mg by mouth daily as needed. dizzyness    . moxifloxacin (AVELOX) 400 MG tablet Take 400 mg by mouth daily.  0  . pilocarpine (PILOCAR) 4 % ophthalmic solution Place 1 drop into the  left eye 4 (four) times daily.     . predniSONE (DELTASONE) 5 MG tablet Take 5 mg by mouth daily.  0  . RA SENNA PLUS 8.6-50 MG tablet Take 1 tablet by mouth every evening.  0  . ramipril (ALTACE) 10 MG capsule Take 10 mg by mouth every morning.     . ramipril (ALTACE) 5 MG capsule Take 5 mg by mouth every evening.  0    Musculoskeletal: Strength & Muscle Tone: decreased Gait & Station: unsteady Patient leans: N/A  Psychiatric Specialty Exam: Physical Exam  Review of Systems  Psychiatric/Behavioral: Positive for hallucinations (delusions). Negative for depression, suicidal ideas and substance abuse. The patient is nervous/anxious and has insomnia.   All other systems reviewed and are negative.   Blood pressure 142/97, pulse 71, temperature 97.6 F (36.4 C), temperature source Oral, resp. rate 14, SpO2 100 %.There is no height or weight on file to calculate BMI.  General Appearance: Casual and Fairly Groomed  Eye Contact:  Good  Speech:  Clear and Coherent and Normal Rate  Volume:  Normal  Mood:  Anxious  Affect:  Appropriate and Congruent  Thought Process:  Irrelevant  Orientation:  Self and situation, not place or time  Thought Content:  Focused on going home, pt reports that she is home already yet she is in the hospital  Suicidal Thoughts:  No  Homicidal Thoughts:  No  Memory:  Immediate;   Fair Recent;   Fair Remote;   Fair  Judgement:  Fair  Insight:  Fair  Psychomotor Activity:  Normal  Concentration:  Concentration: Fair and Attention Span: Fair  Recall:  [employer] of Knowledge:  Fair  Language:  Fair  Akathisia:  No  Handed:    AIMS (if indicated):     Assets:  Communication Skills Desire for Improvement Physical Health Resilience Social Support  ADL's:  Intact  Cognition:  Impaired,  Mild  Sleep:       Treatment Plan Summary: Dementia with aggressive behavior, unstable, warrants geropsychiatry placement.    Disposition: Recommend psychiatric  Inpatient admission when medically cleared.  Benjamine Mola, [place] 09/03/2015 8:36 PM Patient seen face-to-face for psychiatric evaluation, chart reviewed and case discussed with the physician extender and developed treatment plan. Reviewed the information documented and agree with the treatment plan. Corena Pilgrim, MD

## 2015-09-03 NOTE — BH Assessment (Signed)
[place] Assessment Progress Note  The following facilities have been contacted to seek placement for this pt, with results as noted:  Beds available, information sent, decision pending:  [place]:  [facility] (due to dementia) [facility] (due to dementia) [facility] (due to dementia) [facility] (due to dementia)   At capacity:  Kerry Dory Matheny, [place] Triage Specialist [phone]

## 2015-09-03 NOTE — BH Assessment (Signed)
[facility] Assessment Progress Note  [place] contacted facility this date to inform that patient was under review and would review current information and contact social work back on 09/04/15 to inform of disposition.

## 2015-09-04 DIAGNOSIS — R443 Hallucinations, unspecified: Secondary | ICD-10-CM | POA: Diagnosis not present

## 2015-09-04 MED ORDER — ENSURE ENLIVE PO LIQD
237.0000 mL | Freq: Two times a day (BID) | ORAL | Status: DC
  Administered 2015-09-04 – 2015-09-05 (×2): 237 mL via ORAL
  Filled 2015-09-04 (×4): qty 237

## 2015-09-04 NOTE — Progress Notes (Addendum)
Pt is pleasant this am. She is alert to person but not sure of the year w/o prompting.Pt remains with a sitter. She requested a muffin and a donut for breakfast. The only medication the pt wound take was her pilocarpine as she recognized the green top on the bottle. Pt stated, "I have to see my son before I take anything else. " pt refused nausea medcation when offered even though she has c/o nausea and would not eat breakfast. 11am -Pts son stated he would leave [place] now and bring POA paperwork. Pt stated ,"all those pills look different from what I take. " Instructed the pt that some of these are generics made by other pharmaceutical companies.Son brought POA papers. (12:30pm) Contact numbers: Briget Rodrigue: [phone]  Lives in [place]  (son ) lives in [place]  [phone] Pt has edema to both eyes . She states she has been crying a lot. Per son pt has macular generation and usually gets injections in her eyes. Son will notify the pts eye doctor. Spoke to EDP as well. Pt presently has eye packs on both eyes. She did request to eat ice cream and was given orange shebert for lunch. Tom kept abreast pt still; will not take her meds and refuses to eat or drink very much. Will continue to monitor.3pm - Pt took am meds with applesauce and then was given a coke to drink,.3:10pm EKG repeated today. Pt tolerated well. 3:30pm Pt appears much more cooperative now. Yolanda Bonine is at the bedside.4pm -Pt c/o feeling dizzy.Vital signs rechecked. 4:10pm Per pts grandson the pt at times will put her finger in her throat and make herself vomit. Report to [place], Therapist, sports.

## 2015-09-04 NOTE — Progress Notes (Signed)
Pt noted sleeping without distress noted  Will Assess for pcp needs later

## 2015-09-04 NOTE — BH Assessment (Signed)
[place] Assessment Progress Note This Probation officer spoke with patient this date with patient presenting alert and with a plesant affect. Patient is oriented to place but was unsure of the date/year. Patient is somewhat disorganized in her recall although this writer felt patient understood the content of this writer's questions with some redirection. Patient was informed she continues to meet inpatient criteria and will be placed once a facility is located.

## 2015-09-04 NOTE — ED Notes (Signed)
Patient refused all medication.

## 2015-09-04 NOTE — BH Assessment (Addendum)
[facility] Assessment Progress Note  At 09:45 this writer spoke to pt's son, Neylan Stong ([id number]), to ascertain if pt has a healthcare power of attorney.  He reports that pt has assigned this responsibility to him, and that he has the necessary documents to support this.  He agrees to bring the documents to [facility] as soon as possible.  I informed him that at this time we are seeking placement for the pt at a geriatric psychiatry facility.  Pt's nurse, Marcie Bal, has been notified to expect him.  Jalene Mullet, MA Triage Specialist [phone]     Addendum:  Pt's son has brought the only power of attorney documentation in his possession, and it is a durable POA, not a healthcare POA.  Jalene Mullet, [place] Triage Specialist [phone]

## 2015-09-05 DIAGNOSIS — F22 Delusional disorders: Secondary | ICD-10-CM | POA: Diagnosis not present

## 2015-09-05 DIAGNOSIS — E876 Hypokalemia: Secondary | ICD-10-CM | POA: Diagnosis not present

## 2015-09-05 DIAGNOSIS — Z888 Allergy status to other drugs, medicaments and biological substances status: Secondary | ICD-10-CM | POA: Diagnosis not present

## 2015-09-05 DIAGNOSIS — K5901 Slow transit constipation: Secondary | ICD-10-CM | POA: Diagnosis not present

## 2015-09-05 DIAGNOSIS — Z111 Encounter for screening for respiratory tuberculosis: Secondary | ICD-10-CM | POA: Diagnosis not present

## 2015-09-05 DIAGNOSIS — Z79899 Other long term (current) drug therapy: Secondary | ICD-10-CM | POA: Diagnosis not present

## 2015-09-05 DIAGNOSIS — S22000A Wedge compression fracture of unspecified thoracic vertebra, initial encounter for closed fracture: Secondary | ICD-10-CM | POA: Diagnosis not present

## 2015-09-05 DIAGNOSIS — S81802D Unspecified open wound, left lower leg, subsequent encounter: Secondary | ICD-10-CM | POA: Diagnosis not present

## 2015-09-05 DIAGNOSIS — G308 Other Alzheimer's disease: Secondary | ICD-10-CM | POA: Diagnosis not present

## 2015-09-05 DIAGNOSIS — H05223 Edema of bilateral orbit: Secondary | ICD-10-CM | POA: Diagnosis present

## 2015-09-05 DIAGNOSIS — F0281 Dementia in other diseases classified elsewhere with behavioral disturbance: Secondary | ICD-10-CM | POA: Diagnosis not present

## 2015-09-05 DIAGNOSIS — G629 Polyneuropathy, unspecified: Secondary | ICD-10-CM | POA: Diagnosis present

## 2015-09-05 DIAGNOSIS — H353231 Exudative age-related macular degeneration, bilateral, with active choroidal neovascularization: Secondary | ICD-10-CM | POA: Diagnosis not present

## 2015-09-05 DIAGNOSIS — R443 Hallucinations, unspecified: Secondary | ICD-10-CM | POA: Diagnosis not present

## 2015-09-05 DIAGNOSIS — F0151 Vascular dementia with behavioral disturbance: Secondary | ICD-10-CM | POA: Diagnosis present

## 2015-09-05 DIAGNOSIS — E785 Hyperlipidemia, unspecified: Secondary | ICD-10-CM | POA: Diagnosis not present

## 2015-09-05 DIAGNOSIS — I10 Essential (primary) hypertension: Secondary | ICD-10-CM | POA: Diagnosis not present

## 2015-09-05 DIAGNOSIS — S22000D Wedge compression fracture of unspecified thoracic vertebra, subsequent encounter for fracture with routine healing: Secondary | ICD-10-CM | POA: Diagnosis not present

## 2015-09-05 DIAGNOSIS — Z885 Allergy status to narcotic agent status: Secondary | ICD-10-CM | POA: Diagnosis not present

## 2015-09-05 DIAGNOSIS — B3789 Other sites of candidiasis: Secondary | ICD-10-CM | POA: Diagnosis not present

## 2015-09-05 DIAGNOSIS — G309 Alzheimer's disease, unspecified: Secondary | ICD-10-CM | POA: Diagnosis present

## 2015-09-05 DIAGNOSIS — H401123 Primary open-angle glaucoma, left eye, severe stage: Secondary | ICD-10-CM | POA: Diagnosis present

## 2015-09-05 DIAGNOSIS — H401114 Primary open-angle glaucoma, right eye, indeterminate stage: Secondary | ICD-10-CM | POA: Diagnosis present

## 2015-09-05 DIAGNOSIS — R600 Localized edema: Secondary | ICD-10-CM | POA: Diagnosis not present

## 2015-09-05 DIAGNOSIS — Z7952 Long term (current) use of systemic steroids: Secondary | ICD-10-CM | POA: Diagnosis not present

## 2015-09-05 DIAGNOSIS — F0391 Unspecified dementia with behavioral disturbance: Secondary | ICD-10-CM | POA: Diagnosis not present

## 2015-09-05 NOTE — BH Assessment (Addendum)
[facility] Assessment Progress Note  Per Corena Pilgrim, MD, this pt continues to require psychiatric hospitalization.  At 09:58 Jaclyn Shaggy calls from [facility] to report that pt has been accepted to their facility by Geanie Kenning, MD, contingent upon pt being placed under IVC.  She requests that I fax IVC documents to them when completed.  Dr Darleene Cleaver concurs with this decision, and has initiated IVC.  Petition and First Opinion have been faxed to [facility], and at [employer] confirms receipt.  Service of Findings and Custody Order is pending as of this writing.  Pt's nurse has been notified, and agrees to call report to [phone] when the time comes.  Pt is to be transported via [facility].  Jalene Mullet, MA Triage Specialist [phone]   Addendum:  Findings and Custody Order has been served and has been faxed to [facility] at [phone].  At 13:20 I called to confirm receipt.  Call rolled to voice mail and I left a message.  Awaiting call back.  Jalene Mullet, MA Triage Specialist [phone]   Addendum:  At 14:49 I spoke to [place].  She confirms receipt of IVC documents and approves transfer of pt.  Waylan Boga, DNP and pt's nurse, Seth Bake, have been notified.  Jalene Mullet, [place] Triage Specialist [phone]

## 2015-09-05 NOTE — ED Notes (Signed)
Patient refused medication stating, "I don't take medication at night."

## 2015-09-05 NOTE — ED Notes (Signed)
I have called and given report to [facility], accepting RN is Jaclyn Shaggy who requested we notified them of pt's departure, also remember to notify son as well.

## 2015-09-05 NOTE — ED Notes (Signed)
Spoke with patients son and he would like to be notified when patient is being transported to [place], also notified Sgt. Paschal sheriff transport that patient needs to be transported to [facility].

## 2015-09-05 NOTE — ED Notes (Signed)
Attempted to call report to [facility], no answer in admission office at this time.

## 2015-09-24 DIAGNOSIS — F0281 Dementia in other diseases classified elsewhere with behavioral disturbance: Secondary | ICD-10-CM | POA: Diagnosis not present

## 2015-09-24 DIAGNOSIS — L97211 Non-pressure chronic ulcer of right calf limited to breakdown of skin: Secondary | ICD-10-CM | POA: Diagnosis not present

## 2015-09-24 DIAGNOSIS — G309 Alzheimer's disease, unspecified: Secondary | ICD-10-CM | POA: Diagnosis not present

## 2015-09-24 DIAGNOSIS — I10 Essential (primary) hypertension: Secondary | ICD-10-CM | POA: Diagnosis not present

## 2015-09-24 DIAGNOSIS — E785 Hyperlipidemia, unspecified: Secondary | ICD-10-CM | POA: Diagnosis not present

## 2015-09-24 DIAGNOSIS — L97221 Non-pressure chronic ulcer of left calf limited to breakdown of skin: Secondary | ICD-10-CM | POA: Diagnosis not present

## 2015-09-25 DIAGNOSIS — I10 Essential (primary) hypertension: Secondary | ICD-10-CM | POA: Diagnosis not present

## 2015-09-25 DIAGNOSIS — G309 Alzheimer's disease, unspecified: Secondary | ICD-10-CM | POA: Diagnosis not present

## 2015-09-25 DIAGNOSIS — E785 Hyperlipidemia, unspecified: Secondary | ICD-10-CM | POA: Diagnosis not present

## 2015-09-25 DIAGNOSIS — L97211 Non-pressure chronic ulcer of right calf limited to breakdown of skin: Secondary | ICD-10-CM | POA: Diagnosis not present

## 2015-09-25 DIAGNOSIS — L97221 Non-pressure chronic ulcer of left calf limited to breakdown of skin: Secondary | ICD-10-CM | POA: Diagnosis not present

## 2015-09-25 DIAGNOSIS — F0281 Dementia in other diseases classified elsewhere with behavioral disturbance: Secondary | ICD-10-CM | POA: Diagnosis not present

## 2015-09-28 DIAGNOSIS — L97221 Non-pressure chronic ulcer of left calf limited to breakdown of skin: Secondary | ICD-10-CM | POA: Diagnosis not present

## 2015-09-28 DIAGNOSIS — L97211 Non-pressure chronic ulcer of right calf limited to breakdown of skin: Secondary | ICD-10-CM | POA: Diagnosis not present

## 2015-09-28 DIAGNOSIS — E785 Hyperlipidemia, unspecified: Secondary | ICD-10-CM | POA: Diagnosis not present

## 2015-09-28 DIAGNOSIS — I10 Essential (primary) hypertension: Secondary | ICD-10-CM | POA: Diagnosis not present

## 2015-09-28 DIAGNOSIS — G309 Alzheimer's disease, unspecified: Secondary | ICD-10-CM | POA: Diagnosis not present

## 2015-09-28 DIAGNOSIS — F0281 Dementia in other diseases classified elsewhere with behavioral disturbance: Secondary | ICD-10-CM | POA: Diagnosis not present

## 2015-09-30 DIAGNOSIS — I10 Essential (primary) hypertension: Secondary | ICD-10-CM | POA: Diagnosis not present

## 2015-09-30 DIAGNOSIS — L97221 Non-pressure chronic ulcer of left calf limited to breakdown of skin: Secondary | ICD-10-CM | POA: Diagnosis not present

## 2015-09-30 DIAGNOSIS — E785 Hyperlipidemia, unspecified: Secondary | ICD-10-CM | POA: Diagnosis not present

## 2015-09-30 DIAGNOSIS — G309 Alzheimer's disease, unspecified: Secondary | ICD-10-CM | POA: Diagnosis not present

## 2015-09-30 DIAGNOSIS — F0281 Dementia in other diseases classified elsewhere with behavioral disturbance: Secondary | ICD-10-CM | POA: Diagnosis not present

## 2015-09-30 DIAGNOSIS — L97211 Non-pressure chronic ulcer of right calf limited to breakdown of skin: Secondary | ICD-10-CM | POA: Diagnosis not present

## 2015-10-02 DIAGNOSIS — F0281 Dementia in other diseases classified elsewhere with behavioral disturbance: Secondary | ICD-10-CM | POA: Diagnosis not present

## 2015-10-02 DIAGNOSIS — I10 Essential (primary) hypertension: Secondary | ICD-10-CM | POA: Diagnosis not present

## 2015-10-02 DIAGNOSIS — E785 Hyperlipidemia, unspecified: Secondary | ICD-10-CM | POA: Diagnosis not present

## 2015-10-02 DIAGNOSIS — L97211 Non-pressure chronic ulcer of right calf limited to breakdown of skin: Secondary | ICD-10-CM | POA: Diagnosis not present

## 2015-10-02 DIAGNOSIS — L97221 Non-pressure chronic ulcer of left calf limited to breakdown of skin: Secondary | ICD-10-CM | POA: Diagnosis not present

## 2015-10-02 DIAGNOSIS — G309 Alzheimer's disease, unspecified: Secondary | ICD-10-CM | POA: Diagnosis not present

## 2015-10-04 DIAGNOSIS — E785 Hyperlipidemia, unspecified: Secondary | ICD-10-CM | POA: Diagnosis not present

## 2015-10-04 DIAGNOSIS — G309 Alzheimer's disease, unspecified: Secondary | ICD-10-CM | POA: Diagnosis not present

## 2015-10-04 DIAGNOSIS — L97211 Non-pressure chronic ulcer of right calf limited to breakdown of skin: Secondary | ICD-10-CM | POA: Diagnosis not present

## 2015-10-04 DIAGNOSIS — F0281 Dementia in other diseases classified elsewhere with behavioral disturbance: Secondary | ICD-10-CM | POA: Diagnosis not present

## 2015-10-04 DIAGNOSIS — L97221 Non-pressure chronic ulcer of left calf limited to breakdown of skin: Secondary | ICD-10-CM | POA: Diagnosis not present

## 2015-10-04 DIAGNOSIS — I10 Essential (primary) hypertension: Secondary | ICD-10-CM | POA: Diagnosis not present

## 2015-10-05 DIAGNOSIS — L97221 Non-pressure chronic ulcer of left calf limited to breakdown of skin: Secondary | ICD-10-CM | POA: Diagnosis not present

## 2015-10-05 DIAGNOSIS — F0281 Dementia in other diseases classified elsewhere with behavioral disturbance: Secondary | ICD-10-CM | POA: Diagnosis not present

## 2015-10-05 DIAGNOSIS — I10 Essential (primary) hypertension: Secondary | ICD-10-CM | POA: Diagnosis not present

## 2015-10-05 DIAGNOSIS — E785 Hyperlipidemia, unspecified: Secondary | ICD-10-CM | POA: Diagnosis not present

## 2015-10-05 DIAGNOSIS — L97211 Non-pressure chronic ulcer of right calf limited to breakdown of skin: Secondary | ICD-10-CM | POA: Diagnosis not present

## 2015-10-05 DIAGNOSIS — G309 Alzheimer's disease, unspecified: Secondary | ICD-10-CM | POA: Diagnosis not present

## 2015-10-08 DIAGNOSIS — L98499 Non-pressure chronic ulcer of skin of other sites with unspecified severity: Secondary | ICD-10-CM | POA: Diagnosis not present

## 2015-10-08 DIAGNOSIS — F29 Unspecified psychosis not due to a substance or known physiological condition: Secondary | ICD-10-CM | POA: Diagnosis not present

## 2015-10-08 DIAGNOSIS — K5900 Constipation, unspecified: Secondary | ICD-10-CM | POA: Diagnosis not present

## 2015-10-08 DIAGNOSIS — R609 Edema, unspecified: Secondary | ICD-10-CM | POA: Diagnosis not present

## 2015-10-09 DIAGNOSIS — L97221 Non-pressure chronic ulcer of left calf limited to breakdown of skin: Secondary | ICD-10-CM | POA: Diagnosis not present

## 2015-10-09 DIAGNOSIS — L97211 Non-pressure chronic ulcer of right calf limited to breakdown of skin: Secondary | ICD-10-CM | POA: Diagnosis not present

## 2015-10-09 DIAGNOSIS — I10 Essential (primary) hypertension: Secondary | ICD-10-CM | POA: Diagnosis not present

## 2015-10-09 DIAGNOSIS — G309 Alzheimer's disease, unspecified: Secondary | ICD-10-CM | POA: Diagnosis not present

## 2015-10-09 DIAGNOSIS — E785 Hyperlipidemia, unspecified: Secondary | ICD-10-CM | POA: Diagnosis not present

## 2015-10-09 DIAGNOSIS — F0281 Dementia in other diseases classified elsewhere with behavioral disturbance: Secondary | ICD-10-CM | POA: Diagnosis not present

## 2015-10-10 DIAGNOSIS — G309 Alzheimer's disease, unspecified: Secondary | ICD-10-CM | POA: Diagnosis not present

## 2015-10-10 DIAGNOSIS — F0281 Dementia in other diseases classified elsewhere with behavioral disturbance: Secondary | ICD-10-CM | POA: Diagnosis not present

## 2015-10-10 DIAGNOSIS — L97211 Non-pressure chronic ulcer of right calf limited to breakdown of skin: Secondary | ICD-10-CM | POA: Diagnosis not present

## 2015-10-10 DIAGNOSIS — E785 Hyperlipidemia, unspecified: Secondary | ICD-10-CM | POA: Diagnosis not present

## 2015-10-10 DIAGNOSIS — L97221 Non-pressure chronic ulcer of left calf limited to breakdown of skin: Secondary | ICD-10-CM | POA: Diagnosis not present

## 2015-10-10 DIAGNOSIS — I10 Essential (primary) hypertension: Secondary | ICD-10-CM | POA: Diagnosis not present

## 2015-10-12 DIAGNOSIS — I10 Essential (primary) hypertension: Secondary | ICD-10-CM | POA: Diagnosis not present

## 2015-10-12 DIAGNOSIS — L97211 Non-pressure chronic ulcer of right calf limited to breakdown of skin: Secondary | ICD-10-CM | POA: Diagnosis not present

## 2015-10-12 DIAGNOSIS — G309 Alzheimer's disease, unspecified: Secondary | ICD-10-CM | POA: Diagnosis not present

## 2015-10-12 DIAGNOSIS — L97221 Non-pressure chronic ulcer of left calf limited to breakdown of skin: Secondary | ICD-10-CM | POA: Diagnosis not present

## 2015-10-12 DIAGNOSIS — E785 Hyperlipidemia, unspecified: Secondary | ICD-10-CM | POA: Diagnosis not present

## 2015-10-12 DIAGNOSIS — F0281 Dementia in other diseases classified elsewhere with behavioral disturbance: Secondary | ICD-10-CM | POA: Diagnosis not present

## 2015-10-15 DIAGNOSIS — L97211 Non-pressure chronic ulcer of right calf limited to breakdown of skin: Secondary | ICD-10-CM | POA: Diagnosis not present

## 2015-10-15 DIAGNOSIS — I10 Essential (primary) hypertension: Secondary | ICD-10-CM | POA: Diagnosis not present

## 2015-10-15 DIAGNOSIS — G309 Alzheimer's disease, unspecified: Secondary | ICD-10-CM | POA: Diagnosis not present

## 2015-10-15 DIAGNOSIS — L97221 Non-pressure chronic ulcer of left calf limited to breakdown of skin: Secondary | ICD-10-CM | POA: Diagnosis not present

## 2015-10-15 DIAGNOSIS — E785 Hyperlipidemia, unspecified: Secondary | ICD-10-CM | POA: Diagnosis not present

## 2015-10-15 DIAGNOSIS — F0281 Dementia in other diseases classified elsewhere with behavioral disturbance: Secondary | ICD-10-CM | POA: Diagnosis not present

## 2015-10-17 DIAGNOSIS — F0281 Dementia in other diseases classified elsewhere with behavioral disturbance: Secondary | ICD-10-CM | POA: Diagnosis not present

## 2015-10-17 DIAGNOSIS — I10 Essential (primary) hypertension: Secondary | ICD-10-CM | POA: Diagnosis not present

## 2015-10-17 DIAGNOSIS — E785 Hyperlipidemia, unspecified: Secondary | ICD-10-CM | POA: Diagnosis not present

## 2015-10-17 DIAGNOSIS — L97221 Non-pressure chronic ulcer of left calf limited to breakdown of skin: Secondary | ICD-10-CM | POA: Diagnosis not present

## 2015-10-17 DIAGNOSIS — L97211 Non-pressure chronic ulcer of right calf limited to breakdown of skin: Secondary | ICD-10-CM | POA: Diagnosis not present

## 2015-10-17 DIAGNOSIS — G309 Alzheimer's disease, unspecified: Secondary | ICD-10-CM | POA: Diagnosis not present

## 2015-10-18 DIAGNOSIS — E785 Hyperlipidemia, unspecified: Secondary | ICD-10-CM | POA: Diagnosis not present

## 2015-10-18 DIAGNOSIS — L97221 Non-pressure chronic ulcer of left calf limited to breakdown of skin: Secondary | ICD-10-CM | POA: Diagnosis not present

## 2015-10-18 DIAGNOSIS — L97211 Non-pressure chronic ulcer of right calf limited to breakdown of skin: Secondary | ICD-10-CM | POA: Diagnosis not present

## 2015-10-18 DIAGNOSIS — F0281 Dementia in other diseases classified elsewhere with behavioral disturbance: Secondary | ICD-10-CM | POA: Diagnosis not present

## 2015-10-18 DIAGNOSIS — G309 Alzheimer's disease, unspecified: Secondary | ICD-10-CM | POA: Diagnosis not present

## 2015-10-18 DIAGNOSIS — I10 Essential (primary) hypertension: Secondary | ICD-10-CM | POA: Diagnosis not present

## 2015-10-21 DIAGNOSIS — F0281 Dementia in other diseases classified elsewhere with behavioral disturbance: Secondary | ICD-10-CM | POA: Diagnosis not present

## 2015-10-21 DIAGNOSIS — L97211 Non-pressure chronic ulcer of right calf limited to breakdown of skin: Secondary | ICD-10-CM | POA: Diagnosis not present

## 2015-10-21 DIAGNOSIS — L97221 Non-pressure chronic ulcer of left calf limited to breakdown of skin: Secondary | ICD-10-CM | POA: Diagnosis not present

## 2015-10-21 DIAGNOSIS — G309 Alzheimer's disease, unspecified: Secondary | ICD-10-CM | POA: Diagnosis not present

## 2015-10-21 DIAGNOSIS — E785 Hyperlipidemia, unspecified: Secondary | ICD-10-CM | POA: Diagnosis not present

## 2015-10-21 DIAGNOSIS — I10 Essential (primary) hypertension: Secondary | ICD-10-CM | POA: Diagnosis not present

## 2015-10-22 DIAGNOSIS — F0281 Dementia in other diseases classified elsewhere with behavioral disturbance: Secondary | ICD-10-CM | POA: Diagnosis not present

## 2015-10-22 DIAGNOSIS — G309 Alzheimer's disease, unspecified: Secondary | ICD-10-CM | POA: Diagnosis not present

## 2015-10-22 DIAGNOSIS — L97221 Non-pressure chronic ulcer of left calf limited to breakdown of skin: Secondary | ICD-10-CM | POA: Diagnosis not present

## 2015-10-22 DIAGNOSIS — L97211 Non-pressure chronic ulcer of right calf limited to breakdown of skin: Secondary | ICD-10-CM | POA: Diagnosis not present

## 2015-10-22 DIAGNOSIS — I10 Essential (primary) hypertension: Secondary | ICD-10-CM | POA: Diagnosis not present

## 2015-10-22 DIAGNOSIS — E785 Hyperlipidemia, unspecified: Secondary | ICD-10-CM | POA: Diagnosis not present

## 2015-10-23 DIAGNOSIS — H401114 Primary open-angle glaucoma, right eye, indeterminate stage: Secondary | ICD-10-CM | POA: Diagnosis not present

## 2015-10-23 DIAGNOSIS — R609 Edema, unspecified: Secondary | ICD-10-CM | POA: Diagnosis not present

## 2015-10-23 DIAGNOSIS — H401123 Primary open-angle glaucoma, left eye, severe stage: Secondary | ICD-10-CM | POA: Diagnosis not present

## 2015-10-23 DIAGNOSIS — I10 Essential (primary) hypertension: Secondary | ICD-10-CM | POA: Diagnosis not present

## 2015-10-24 DIAGNOSIS — L97211 Non-pressure chronic ulcer of right calf limited to breakdown of skin: Secondary | ICD-10-CM | POA: Diagnosis not present

## 2015-10-24 DIAGNOSIS — L97221 Non-pressure chronic ulcer of left calf limited to breakdown of skin: Secondary | ICD-10-CM | POA: Diagnosis not present

## 2015-10-24 DIAGNOSIS — E785 Hyperlipidemia, unspecified: Secondary | ICD-10-CM | POA: Diagnosis not present

## 2015-10-24 DIAGNOSIS — G309 Alzheimer's disease, unspecified: Secondary | ICD-10-CM | POA: Diagnosis not present

## 2015-10-24 DIAGNOSIS — F0281 Dementia in other diseases classified elsewhere with behavioral disturbance: Secondary | ICD-10-CM | POA: Diagnosis not present

## 2015-10-24 DIAGNOSIS — I10 Essential (primary) hypertension: Secondary | ICD-10-CM | POA: Diagnosis not present

## 2015-10-26 DIAGNOSIS — L97211 Non-pressure chronic ulcer of right calf limited to breakdown of skin: Secondary | ICD-10-CM | POA: Diagnosis not present

## 2015-10-26 DIAGNOSIS — L97221 Non-pressure chronic ulcer of left calf limited to breakdown of skin: Secondary | ICD-10-CM | POA: Diagnosis not present

## 2015-10-26 DIAGNOSIS — I10 Essential (primary) hypertension: Secondary | ICD-10-CM | POA: Diagnosis not present

## 2015-10-26 DIAGNOSIS — G309 Alzheimer's disease, unspecified: Secondary | ICD-10-CM | POA: Diagnosis not present

## 2015-10-26 DIAGNOSIS — F0281 Dementia in other diseases classified elsewhere with behavioral disturbance: Secondary | ICD-10-CM | POA: Diagnosis not present

## 2015-10-26 DIAGNOSIS — E785 Hyperlipidemia, unspecified: Secondary | ICD-10-CM | POA: Diagnosis not present

## 2015-10-28 DIAGNOSIS — L97211 Non-pressure chronic ulcer of right calf limited to breakdown of skin: Secondary | ICD-10-CM | POA: Diagnosis not present

## 2015-10-28 DIAGNOSIS — E785 Hyperlipidemia, unspecified: Secondary | ICD-10-CM | POA: Diagnosis not present

## 2015-10-28 DIAGNOSIS — I10 Essential (primary) hypertension: Secondary | ICD-10-CM | POA: Diagnosis not present

## 2015-10-28 DIAGNOSIS — F0281 Dementia in other diseases classified elsewhere with behavioral disturbance: Secondary | ICD-10-CM | POA: Diagnosis not present

## 2015-10-28 DIAGNOSIS — L97221 Non-pressure chronic ulcer of left calf limited to breakdown of skin: Secondary | ICD-10-CM | POA: Diagnosis not present

## 2015-10-28 DIAGNOSIS — G309 Alzheimer's disease, unspecified: Secondary | ICD-10-CM | POA: Diagnosis not present

## 2015-10-29 DIAGNOSIS — E785 Hyperlipidemia, unspecified: Secondary | ICD-10-CM | POA: Diagnosis not present

## 2015-10-29 DIAGNOSIS — F0281 Dementia in other diseases classified elsewhere with behavioral disturbance: Secondary | ICD-10-CM | POA: Diagnosis not present

## 2015-10-29 DIAGNOSIS — I10 Essential (primary) hypertension: Secondary | ICD-10-CM | POA: Diagnosis not present

## 2015-10-29 DIAGNOSIS — G309 Alzheimer's disease, unspecified: Secondary | ICD-10-CM | POA: Diagnosis not present

## 2015-10-29 DIAGNOSIS — L97211 Non-pressure chronic ulcer of right calf limited to breakdown of skin: Secondary | ICD-10-CM | POA: Diagnosis not present

## 2015-10-29 DIAGNOSIS — L97221 Non-pressure chronic ulcer of left calf limited to breakdown of skin: Secondary | ICD-10-CM | POA: Diagnosis not present

## 2015-10-31 DIAGNOSIS — Z961 Presence of intraocular lens: Secondary | ICD-10-CM | POA: Diagnosis not present

## 2015-10-31 DIAGNOSIS — L97211 Non-pressure chronic ulcer of right calf limited to breakdown of skin: Secondary | ICD-10-CM | POA: Diagnosis not present

## 2015-10-31 DIAGNOSIS — F0281 Dementia in other diseases classified elsewhere with behavioral disturbance: Secondary | ICD-10-CM | POA: Diagnosis not present

## 2015-10-31 DIAGNOSIS — G309 Alzheimer's disease, unspecified: Secondary | ICD-10-CM | POA: Diagnosis not present

## 2015-10-31 DIAGNOSIS — H401114 Primary open-angle glaucoma, right eye, indeterminate stage: Secondary | ICD-10-CM | POA: Diagnosis not present

## 2015-10-31 DIAGNOSIS — I10 Essential (primary) hypertension: Secondary | ICD-10-CM | POA: Diagnosis not present

## 2015-10-31 DIAGNOSIS — H401123 Primary open-angle glaucoma, left eye, severe stage: Secondary | ICD-10-CM | POA: Diagnosis not present

## 2015-10-31 DIAGNOSIS — H353231 Exudative age-related macular degeneration, bilateral, with active choroidal neovascularization: Secondary | ICD-10-CM | POA: Diagnosis not present

## 2015-10-31 DIAGNOSIS — L97221 Non-pressure chronic ulcer of left calf limited to breakdown of skin: Secondary | ICD-10-CM | POA: Diagnosis not present

## 2015-10-31 DIAGNOSIS — E785 Hyperlipidemia, unspecified: Secondary | ICD-10-CM | POA: Diagnosis not present

## 2015-11-01 DIAGNOSIS — I10 Essential (primary) hypertension: Secondary | ICD-10-CM | POA: Diagnosis not present

## 2015-11-01 DIAGNOSIS — L97221 Non-pressure chronic ulcer of left calf limited to breakdown of skin: Secondary | ICD-10-CM | POA: Diagnosis not present

## 2015-11-01 DIAGNOSIS — E785 Hyperlipidemia, unspecified: Secondary | ICD-10-CM | POA: Diagnosis not present

## 2015-11-01 DIAGNOSIS — F0281 Dementia in other diseases classified elsewhere with behavioral disturbance: Secondary | ICD-10-CM | POA: Diagnosis not present

## 2015-11-01 DIAGNOSIS — G309 Alzheimer's disease, unspecified: Secondary | ICD-10-CM | POA: Diagnosis not present

## 2015-11-01 DIAGNOSIS — L97211 Non-pressure chronic ulcer of right calf limited to breakdown of skin: Secondary | ICD-10-CM | POA: Diagnosis not present

## 2015-11-04 DIAGNOSIS — E785 Hyperlipidemia, unspecified: Secondary | ICD-10-CM | POA: Diagnosis not present

## 2015-11-04 DIAGNOSIS — F0281 Dementia in other diseases classified elsewhere with behavioral disturbance: Secondary | ICD-10-CM | POA: Diagnosis not present

## 2015-11-04 DIAGNOSIS — L97221 Non-pressure chronic ulcer of left calf limited to breakdown of skin: Secondary | ICD-10-CM | POA: Diagnosis not present

## 2015-11-04 DIAGNOSIS — I10 Essential (primary) hypertension: Secondary | ICD-10-CM | POA: Diagnosis not present

## 2015-11-04 DIAGNOSIS — G309 Alzheimer's disease, unspecified: Secondary | ICD-10-CM | POA: Diagnosis not present

## 2015-11-04 DIAGNOSIS — L97211 Non-pressure chronic ulcer of right calf limited to breakdown of skin: Secondary | ICD-10-CM | POA: Diagnosis not present

## 2015-11-06 DIAGNOSIS — L98499 Non-pressure chronic ulcer of skin of other sites with unspecified severity: Secondary | ICD-10-CM | POA: Diagnosis not present

## 2015-11-06 DIAGNOSIS — R609 Edema, unspecified: Secondary | ICD-10-CM | POA: Diagnosis not present

## 2015-11-07 DIAGNOSIS — L97221 Non-pressure chronic ulcer of left calf limited to breakdown of skin: Secondary | ICD-10-CM | POA: Diagnosis not present

## 2015-11-07 DIAGNOSIS — L97211 Non-pressure chronic ulcer of right calf limited to breakdown of skin: Secondary | ICD-10-CM | POA: Diagnosis not present

## 2015-11-07 DIAGNOSIS — G309 Alzheimer's disease, unspecified: Secondary | ICD-10-CM | POA: Diagnosis not present

## 2015-11-07 DIAGNOSIS — F0281 Dementia in other diseases classified elsewhere with behavioral disturbance: Secondary | ICD-10-CM | POA: Diagnosis not present

## 2015-11-07 DIAGNOSIS — E785 Hyperlipidemia, unspecified: Secondary | ICD-10-CM | POA: Diagnosis not present

## 2015-11-07 DIAGNOSIS — I10 Essential (primary) hypertension: Secondary | ICD-10-CM | POA: Diagnosis not present

## 2015-11-11 DIAGNOSIS — E785 Hyperlipidemia, unspecified: Secondary | ICD-10-CM | POA: Diagnosis not present

## 2015-11-11 DIAGNOSIS — I10 Essential (primary) hypertension: Secondary | ICD-10-CM | POA: Diagnosis not present

## 2015-11-11 DIAGNOSIS — G309 Alzheimer's disease, unspecified: Secondary | ICD-10-CM | POA: Diagnosis not present

## 2015-11-11 DIAGNOSIS — L97221 Non-pressure chronic ulcer of left calf limited to breakdown of skin: Secondary | ICD-10-CM | POA: Diagnosis not present

## 2015-11-11 DIAGNOSIS — F0281 Dementia in other diseases classified elsewhere with behavioral disturbance: Secondary | ICD-10-CM | POA: Diagnosis not present

## 2015-11-11 DIAGNOSIS — L97211 Non-pressure chronic ulcer of right calf limited to breakdown of skin: Secondary | ICD-10-CM | POA: Diagnosis not present

## 2015-11-14 DIAGNOSIS — L97211 Non-pressure chronic ulcer of right calf limited to breakdown of skin: Secondary | ICD-10-CM | POA: Diagnosis not present

## 2015-11-14 DIAGNOSIS — F0281 Dementia in other diseases classified elsewhere with behavioral disturbance: Secondary | ICD-10-CM | POA: Diagnosis not present

## 2015-11-14 DIAGNOSIS — G309 Alzheimer's disease, unspecified: Secondary | ICD-10-CM | POA: Diagnosis not present

## 2015-11-14 DIAGNOSIS — L97221 Non-pressure chronic ulcer of left calf limited to breakdown of skin: Secondary | ICD-10-CM | POA: Diagnosis not present

## 2015-11-14 DIAGNOSIS — I10 Essential (primary) hypertension: Secondary | ICD-10-CM | POA: Diagnosis not present

## 2015-11-14 DIAGNOSIS — E785 Hyperlipidemia, unspecified: Secondary | ICD-10-CM | POA: Diagnosis not present

## 2015-11-18 DIAGNOSIS — I10 Essential (primary) hypertension: Secondary | ICD-10-CM | POA: Diagnosis not present

## 2015-11-18 DIAGNOSIS — L97211 Non-pressure chronic ulcer of right calf limited to breakdown of skin: Secondary | ICD-10-CM | POA: Diagnosis not present

## 2015-11-18 DIAGNOSIS — L97221 Non-pressure chronic ulcer of left calf limited to breakdown of skin: Secondary | ICD-10-CM | POA: Diagnosis not present

## 2015-11-18 DIAGNOSIS — E785 Hyperlipidemia, unspecified: Secondary | ICD-10-CM | POA: Diagnosis not present

## 2015-11-18 DIAGNOSIS — G309 Alzheimer's disease, unspecified: Secondary | ICD-10-CM | POA: Diagnosis not present

## 2015-11-18 DIAGNOSIS — F0281 Dementia in other diseases classified elsewhere with behavioral disturbance: Secondary | ICD-10-CM | POA: Diagnosis not present

## 2015-11-19 DIAGNOSIS — R609 Edema, unspecified: Secondary | ICD-10-CM | POA: Diagnosis not present

## 2015-11-21 DIAGNOSIS — L97221 Non-pressure chronic ulcer of left calf limited to breakdown of skin: Secondary | ICD-10-CM | POA: Diagnosis not present

## 2015-11-21 DIAGNOSIS — G309 Alzheimer's disease, unspecified: Secondary | ICD-10-CM | POA: Diagnosis not present

## 2015-11-21 DIAGNOSIS — E785 Hyperlipidemia, unspecified: Secondary | ICD-10-CM | POA: Diagnosis not present

## 2015-11-21 DIAGNOSIS — I10 Essential (primary) hypertension: Secondary | ICD-10-CM | POA: Diagnosis not present

## 2015-11-21 DIAGNOSIS — L97211 Non-pressure chronic ulcer of right calf limited to breakdown of skin: Secondary | ICD-10-CM | POA: Diagnosis not present

## 2015-11-21 DIAGNOSIS — F0281 Dementia in other diseases classified elsewhere with behavioral disturbance: Secondary | ICD-10-CM | POA: Diagnosis not present

## 2015-11-23 DIAGNOSIS — E785 Hyperlipidemia, unspecified: Secondary | ICD-10-CM | POA: Diagnosis not present

## 2015-11-23 DIAGNOSIS — I10 Essential (primary) hypertension: Secondary | ICD-10-CM | POA: Diagnosis not present

## 2015-11-23 DIAGNOSIS — L97221 Non-pressure chronic ulcer of left calf limited to breakdown of skin: Secondary | ICD-10-CM | POA: Diagnosis not present

## 2015-11-23 DIAGNOSIS — F0281 Dementia in other diseases classified elsewhere with behavioral disturbance: Secondary | ICD-10-CM | POA: Diagnosis not present

## 2015-11-23 DIAGNOSIS — G309 Alzheimer's disease, unspecified: Secondary | ICD-10-CM | POA: Diagnosis not present

## 2015-11-25 DIAGNOSIS — F0281 Dementia in other diseases classified elsewhere with behavioral disturbance: Secondary | ICD-10-CM | POA: Diagnosis not present

## 2015-11-25 DIAGNOSIS — L97221 Non-pressure chronic ulcer of left calf limited to breakdown of skin: Secondary | ICD-10-CM | POA: Diagnosis not present

## 2015-11-25 DIAGNOSIS — G309 Alzheimer's disease, unspecified: Secondary | ICD-10-CM | POA: Diagnosis not present

## 2015-11-25 DIAGNOSIS — E785 Hyperlipidemia, unspecified: Secondary | ICD-10-CM | POA: Diagnosis not present

## 2015-11-25 DIAGNOSIS — I10 Essential (primary) hypertension: Secondary | ICD-10-CM | POA: Diagnosis not present

## 2015-11-28 DIAGNOSIS — F0281 Dementia in other diseases classified elsewhere with behavioral disturbance: Secondary | ICD-10-CM | POA: Diagnosis not present

## 2015-11-28 DIAGNOSIS — L97221 Non-pressure chronic ulcer of left calf limited to breakdown of skin: Secondary | ICD-10-CM | POA: Diagnosis not present

## 2015-11-28 DIAGNOSIS — I10 Essential (primary) hypertension: Secondary | ICD-10-CM | POA: Diagnosis not present

## 2015-11-28 DIAGNOSIS — E785 Hyperlipidemia, unspecified: Secondary | ICD-10-CM | POA: Diagnosis not present

## 2015-11-28 DIAGNOSIS — G309 Alzheimer's disease, unspecified: Secondary | ICD-10-CM | POA: Diagnosis not present

## 2015-12-02 DIAGNOSIS — E785 Hyperlipidemia, unspecified: Secondary | ICD-10-CM | POA: Diagnosis not present

## 2015-12-02 DIAGNOSIS — G309 Alzheimer's disease, unspecified: Secondary | ICD-10-CM | POA: Diagnosis not present

## 2015-12-02 DIAGNOSIS — I10 Essential (primary) hypertension: Secondary | ICD-10-CM | POA: Diagnosis not present

## 2015-12-02 DIAGNOSIS — F0281 Dementia in other diseases classified elsewhere with behavioral disturbance: Secondary | ICD-10-CM | POA: Diagnosis not present

## 2015-12-02 DIAGNOSIS — L97221 Non-pressure chronic ulcer of left calf limited to breakdown of skin: Secondary | ICD-10-CM | POA: Diagnosis not present

## 2015-12-05 DIAGNOSIS — G309 Alzheimer's disease, unspecified: Secondary | ICD-10-CM | POA: Diagnosis not present

## 2015-12-05 DIAGNOSIS — I10 Essential (primary) hypertension: Secondary | ICD-10-CM | POA: Diagnosis not present

## 2015-12-05 DIAGNOSIS — E785 Hyperlipidemia, unspecified: Secondary | ICD-10-CM | POA: Diagnosis not present

## 2015-12-05 DIAGNOSIS — F0281 Dementia in other diseases classified elsewhere with behavioral disturbance: Secondary | ICD-10-CM | POA: Diagnosis not present

## 2015-12-05 DIAGNOSIS — L97221 Non-pressure chronic ulcer of left calf limited to breakdown of skin: Secondary | ICD-10-CM | POA: Diagnosis not present

## 2015-12-09 DIAGNOSIS — G309 Alzheimer's disease, unspecified: Secondary | ICD-10-CM | POA: Diagnosis not present

## 2015-12-09 DIAGNOSIS — F0281 Dementia in other diseases classified elsewhere with behavioral disturbance: Secondary | ICD-10-CM | POA: Diagnosis not present

## 2015-12-09 DIAGNOSIS — I10 Essential (primary) hypertension: Secondary | ICD-10-CM | POA: Diagnosis not present

## 2015-12-09 DIAGNOSIS — L97221 Non-pressure chronic ulcer of left calf limited to breakdown of skin: Secondary | ICD-10-CM | POA: Diagnosis not present

## 2015-12-09 DIAGNOSIS — E785 Hyperlipidemia, unspecified: Secondary | ICD-10-CM | POA: Diagnosis not present

## 2015-12-11 DIAGNOSIS — L0390 Cellulitis, unspecified: Secondary | ICD-10-CM | POA: Diagnosis not present

## 2015-12-11 DIAGNOSIS — R609 Edema, unspecified: Secondary | ICD-10-CM | POA: Diagnosis not present

## 2015-12-12 DIAGNOSIS — F0281 Dementia in other diseases classified elsewhere with behavioral disturbance: Secondary | ICD-10-CM | POA: Diagnosis not present

## 2015-12-12 DIAGNOSIS — G309 Alzheimer's disease, unspecified: Secondary | ICD-10-CM | POA: Diagnosis not present

## 2015-12-12 DIAGNOSIS — L97221 Non-pressure chronic ulcer of left calf limited to breakdown of skin: Secondary | ICD-10-CM | POA: Diagnosis not present

## 2015-12-12 DIAGNOSIS — E785 Hyperlipidemia, unspecified: Secondary | ICD-10-CM | POA: Diagnosis not present

## 2015-12-12 DIAGNOSIS — I10 Essential (primary) hypertension: Secondary | ICD-10-CM | POA: Diagnosis not present

## 2015-12-16 DIAGNOSIS — G309 Alzheimer's disease, unspecified: Secondary | ICD-10-CM | POA: Diagnosis not present

## 2015-12-16 DIAGNOSIS — E785 Hyperlipidemia, unspecified: Secondary | ICD-10-CM | POA: Diagnosis not present

## 2015-12-16 DIAGNOSIS — I10 Essential (primary) hypertension: Secondary | ICD-10-CM | POA: Diagnosis not present

## 2015-12-16 DIAGNOSIS — L97221 Non-pressure chronic ulcer of left calf limited to breakdown of skin: Secondary | ICD-10-CM | POA: Diagnosis not present

## 2015-12-16 DIAGNOSIS — F0281 Dementia in other diseases classified elsewhere with behavioral disturbance: Secondary | ICD-10-CM | POA: Diagnosis not present

## 2015-12-18 DIAGNOSIS — R609 Edema, unspecified: Secondary | ICD-10-CM | POA: Diagnosis not present

## 2015-12-19 DIAGNOSIS — E785 Hyperlipidemia, unspecified: Secondary | ICD-10-CM | POA: Diagnosis not present

## 2015-12-19 DIAGNOSIS — L97221 Non-pressure chronic ulcer of left calf limited to breakdown of skin: Secondary | ICD-10-CM | POA: Diagnosis not present

## 2015-12-19 DIAGNOSIS — I10 Essential (primary) hypertension: Secondary | ICD-10-CM | POA: Diagnosis not present

## 2015-12-19 DIAGNOSIS — F0281 Dementia in other diseases classified elsewhere with behavioral disturbance: Secondary | ICD-10-CM | POA: Diagnosis not present

## 2015-12-19 DIAGNOSIS — G309 Alzheimer's disease, unspecified: Secondary | ICD-10-CM | POA: Diagnosis not present

## 2015-12-23 DIAGNOSIS — E785 Hyperlipidemia, unspecified: Secondary | ICD-10-CM | POA: Diagnosis not present

## 2015-12-23 DIAGNOSIS — F0281 Dementia in other diseases classified elsewhere with behavioral disturbance: Secondary | ICD-10-CM | POA: Diagnosis not present

## 2015-12-23 DIAGNOSIS — I10 Essential (primary) hypertension: Secondary | ICD-10-CM | POA: Diagnosis not present

## 2015-12-23 DIAGNOSIS — L97221 Non-pressure chronic ulcer of left calf limited to breakdown of skin: Secondary | ICD-10-CM | POA: Diagnosis not present

## 2015-12-23 DIAGNOSIS — G309 Alzheimer's disease, unspecified: Secondary | ICD-10-CM | POA: Diagnosis not present

## 2015-12-25 DIAGNOSIS — R609 Edema, unspecified: Secondary | ICD-10-CM | POA: Diagnosis not present

## 2015-12-26 DIAGNOSIS — L97221 Non-pressure chronic ulcer of left calf limited to breakdown of skin: Secondary | ICD-10-CM | POA: Diagnosis not present

## 2015-12-26 DIAGNOSIS — F0281 Dementia in other diseases classified elsewhere with behavioral disturbance: Secondary | ICD-10-CM | POA: Diagnosis not present

## 2015-12-26 DIAGNOSIS — G309 Alzheimer's disease, unspecified: Secondary | ICD-10-CM | POA: Diagnosis not present

## 2015-12-26 DIAGNOSIS — I10 Essential (primary) hypertension: Secondary | ICD-10-CM | POA: Diagnosis not present

## 2015-12-26 DIAGNOSIS — E785 Hyperlipidemia, unspecified: Secondary | ICD-10-CM | POA: Diagnosis not present

## 2015-12-30 DIAGNOSIS — E785 Hyperlipidemia, unspecified: Secondary | ICD-10-CM | POA: Diagnosis not present

## 2015-12-30 DIAGNOSIS — I10 Essential (primary) hypertension: Secondary | ICD-10-CM | POA: Diagnosis not present

## 2015-12-30 DIAGNOSIS — L97221 Non-pressure chronic ulcer of left calf limited to breakdown of skin: Secondary | ICD-10-CM | POA: Diagnosis not present

## 2015-12-30 DIAGNOSIS — G309 Alzheimer's disease, unspecified: Secondary | ICD-10-CM | POA: Diagnosis not present

## 2015-12-30 DIAGNOSIS — F0281 Dementia in other diseases classified elsewhere with behavioral disturbance: Secondary | ICD-10-CM | POA: Diagnosis not present

## 2016-01-02 DIAGNOSIS — G309 Alzheimer's disease, unspecified: Secondary | ICD-10-CM | POA: Diagnosis not present

## 2016-01-02 DIAGNOSIS — F0281 Dementia in other diseases classified elsewhere with behavioral disturbance: Secondary | ICD-10-CM | POA: Diagnosis not present

## 2016-01-02 DIAGNOSIS — L97221 Non-pressure chronic ulcer of left calf limited to breakdown of skin: Secondary | ICD-10-CM | POA: Diagnosis not present

## 2016-01-02 DIAGNOSIS — E785 Hyperlipidemia, unspecified: Secondary | ICD-10-CM | POA: Diagnosis not present

## 2016-01-02 DIAGNOSIS — I10 Essential (primary) hypertension: Secondary | ICD-10-CM | POA: Diagnosis not present

## 2016-01-06 DIAGNOSIS — F0281 Dementia in other diseases classified elsewhere with behavioral disturbance: Secondary | ICD-10-CM | POA: Diagnosis not present

## 2016-01-06 DIAGNOSIS — E785 Hyperlipidemia, unspecified: Secondary | ICD-10-CM | POA: Diagnosis not present

## 2016-01-06 DIAGNOSIS — G309 Alzheimer's disease, unspecified: Secondary | ICD-10-CM | POA: Diagnosis not present

## 2016-01-06 DIAGNOSIS — L97221 Non-pressure chronic ulcer of left calf limited to breakdown of skin: Secondary | ICD-10-CM | POA: Diagnosis not present

## 2016-01-06 DIAGNOSIS — I10 Essential (primary) hypertension: Secondary | ICD-10-CM | POA: Diagnosis not present

## 2016-01-09 DIAGNOSIS — F0281 Dementia in other diseases classified elsewhere with behavioral disturbance: Secondary | ICD-10-CM | POA: Diagnosis not present

## 2016-01-09 DIAGNOSIS — L97221 Non-pressure chronic ulcer of left calf limited to breakdown of skin: Secondary | ICD-10-CM | POA: Diagnosis not present

## 2016-01-09 DIAGNOSIS — E785 Hyperlipidemia, unspecified: Secondary | ICD-10-CM | POA: Diagnosis not present

## 2016-01-09 DIAGNOSIS — G309 Alzheimer's disease, unspecified: Secondary | ICD-10-CM | POA: Diagnosis not present

## 2016-01-09 DIAGNOSIS — I10 Essential (primary) hypertension: Secondary | ICD-10-CM | POA: Diagnosis not present

## 2016-01-13 DIAGNOSIS — I10 Essential (primary) hypertension: Secondary | ICD-10-CM | POA: Diagnosis not present

## 2016-01-13 DIAGNOSIS — E785 Hyperlipidemia, unspecified: Secondary | ICD-10-CM | POA: Diagnosis not present

## 2016-01-13 DIAGNOSIS — L97221 Non-pressure chronic ulcer of left calf limited to breakdown of skin: Secondary | ICD-10-CM | POA: Diagnosis not present

## 2016-01-13 DIAGNOSIS — F0281 Dementia in other diseases classified elsewhere with behavioral disturbance: Secondary | ICD-10-CM | POA: Diagnosis not present

## 2016-01-13 DIAGNOSIS — G309 Alzheimer's disease, unspecified: Secondary | ICD-10-CM | POA: Diagnosis not present

## 2016-01-16 DIAGNOSIS — E785 Hyperlipidemia, unspecified: Secondary | ICD-10-CM | POA: Diagnosis not present

## 2016-01-16 DIAGNOSIS — F0281 Dementia in other diseases classified elsewhere with behavioral disturbance: Secondary | ICD-10-CM | POA: Diagnosis not present

## 2016-01-16 DIAGNOSIS — G309 Alzheimer's disease, unspecified: Secondary | ICD-10-CM | POA: Diagnosis not present

## 2016-01-16 DIAGNOSIS — I10 Essential (primary) hypertension: Secondary | ICD-10-CM | POA: Diagnosis not present

## 2016-01-16 DIAGNOSIS — L97221 Non-pressure chronic ulcer of left calf limited to breakdown of skin: Secondary | ICD-10-CM | POA: Diagnosis not present

## 2016-01-16 DIAGNOSIS — R609 Edema, unspecified: Secondary | ICD-10-CM | POA: Diagnosis not present

## 2016-01-20 DIAGNOSIS — F0281 Dementia in other diseases classified elsewhere with behavioral disturbance: Secondary | ICD-10-CM | POA: Diagnosis not present

## 2016-01-20 DIAGNOSIS — L97221 Non-pressure chronic ulcer of left calf limited to breakdown of skin: Secondary | ICD-10-CM | POA: Diagnosis not present

## 2016-01-20 DIAGNOSIS — G309 Alzheimer's disease, unspecified: Secondary | ICD-10-CM | POA: Diagnosis not present

## 2016-01-20 DIAGNOSIS — I10 Essential (primary) hypertension: Secondary | ICD-10-CM | POA: Diagnosis not present

## 2016-01-20 DIAGNOSIS — E785 Hyperlipidemia, unspecified: Secondary | ICD-10-CM | POA: Diagnosis not present

## 2016-01-22 DIAGNOSIS — L97211 Non-pressure chronic ulcer of right calf limited to breakdown of skin: Secondary | ICD-10-CM | POA: Diagnosis not present

## 2016-01-22 DIAGNOSIS — I10 Essential (primary) hypertension: Secondary | ICD-10-CM | POA: Diagnosis not present

## 2016-01-22 DIAGNOSIS — F0281 Dementia in other diseases classified elsewhere with behavioral disturbance: Secondary | ICD-10-CM | POA: Diagnosis not present

## 2016-01-22 DIAGNOSIS — G309 Alzheimer's disease, unspecified: Secondary | ICD-10-CM | POA: Diagnosis not present

## 2016-01-22 DIAGNOSIS — L98499 Non-pressure chronic ulcer of skin of other sites with unspecified severity: Secondary | ICD-10-CM | POA: Diagnosis not present

## 2016-01-22 DIAGNOSIS — E785 Hyperlipidemia, unspecified: Secondary | ICD-10-CM | POA: Diagnosis not present

## 2016-01-22 DIAGNOSIS — L97221 Non-pressure chronic ulcer of left calf limited to breakdown of skin: Secondary | ICD-10-CM | POA: Diagnosis not present

## 2016-01-24 DIAGNOSIS — E785 Hyperlipidemia, unspecified: Secondary | ICD-10-CM | POA: Diagnosis not present

## 2016-01-24 DIAGNOSIS — G309 Alzheimer's disease, unspecified: Secondary | ICD-10-CM | POA: Diagnosis not present

## 2016-01-24 DIAGNOSIS — L97221 Non-pressure chronic ulcer of left calf limited to breakdown of skin: Secondary | ICD-10-CM | POA: Diagnosis not present

## 2016-01-24 DIAGNOSIS — L97211 Non-pressure chronic ulcer of right calf limited to breakdown of skin: Secondary | ICD-10-CM | POA: Diagnosis not present

## 2016-01-24 DIAGNOSIS — F0281 Dementia in other diseases classified elsewhere with behavioral disturbance: Secondary | ICD-10-CM | POA: Diagnosis not present

## 2016-01-24 DIAGNOSIS — I10 Essential (primary) hypertension: Secondary | ICD-10-CM | POA: Diagnosis not present

## 2016-01-27 DIAGNOSIS — L97211 Non-pressure chronic ulcer of right calf limited to breakdown of skin: Secondary | ICD-10-CM | POA: Diagnosis not present

## 2016-01-27 DIAGNOSIS — G64 Other disorders of peripheral nervous system: Secondary | ICD-10-CM | POA: Diagnosis not present

## 2016-01-27 DIAGNOSIS — F0281 Dementia in other diseases classified elsewhere with behavioral disturbance: Secondary | ICD-10-CM | POA: Diagnosis not present

## 2016-01-27 DIAGNOSIS — G309 Alzheimer's disease, unspecified: Secondary | ICD-10-CM | POA: Diagnosis not present

## 2016-01-27 DIAGNOSIS — E785 Hyperlipidemia, unspecified: Secondary | ICD-10-CM | POA: Diagnosis not present

## 2016-01-27 DIAGNOSIS — L97221 Non-pressure chronic ulcer of left calf limited to breakdown of skin: Secondary | ICD-10-CM | POA: Diagnosis not present

## 2016-01-27 DIAGNOSIS — L84 Corns and callosities: Secondary | ICD-10-CM | POA: Diagnosis not present

## 2016-01-27 DIAGNOSIS — I739 Peripheral vascular disease, unspecified: Secondary | ICD-10-CM | POA: Diagnosis not present

## 2016-01-27 DIAGNOSIS — I10 Essential (primary) hypertension: Secondary | ICD-10-CM | POA: Diagnosis not present

## 2016-01-27 DIAGNOSIS — L97521 Non-pressure chronic ulcer of other part of left foot limited to breakdown of skin: Secondary | ICD-10-CM | POA: Diagnosis not present

## 2016-01-27 DIAGNOSIS — L602 Onychogryphosis: Secondary | ICD-10-CM | POA: Diagnosis not present

## 2016-01-28 DIAGNOSIS — F0281 Dementia in other diseases classified elsewhere with behavioral disturbance: Secondary | ICD-10-CM | POA: Diagnosis not present

## 2016-01-28 DIAGNOSIS — L97221 Non-pressure chronic ulcer of left calf limited to breakdown of skin: Secondary | ICD-10-CM | POA: Diagnosis not present

## 2016-01-28 DIAGNOSIS — E785 Hyperlipidemia, unspecified: Secondary | ICD-10-CM | POA: Diagnosis not present

## 2016-01-28 DIAGNOSIS — G309 Alzheimer's disease, unspecified: Secondary | ICD-10-CM | POA: Diagnosis not present

## 2016-01-28 DIAGNOSIS — I10 Essential (primary) hypertension: Secondary | ICD-10-CM | POA: Diagnosis not present

## 2016-01-28 DIAGNOSIS — L97211 Non-pressure chronic ulcer of right calf limited to breakdown of skin: Secondary | ICD-10-CM | POA: Diagnosis not present

## 2016-01-30 DIAGNOSIS — L98499 Non-pressure chronic ulcer of skin of other sites with unspecified severity: Secondary | ICD-10-CM | POA: Diagnosis not present

## 2016-01-30 DIAGNOSIS — R609 Edema, unspecified: Secondary | ICD-10-CM | POA: Diagnosis not present

## 2016-01-31 DIAGNOSIS — L97211 Non-pressure chronic ulcer of right calf limited to breakdown of skin: Secondary | ICD-10-CM | POA: Diagnosis not present

## 2016-01-31 DIAGNOSIS — E785 Hyperlipidemia, unspecified: Secondary | ICD-10-CM | POA: Diagnosis not present

## 2016-01-31 DIAGNOSIS — L97221 Non-pressure chronic ulcer of left calf limited to breakdown of skin: Secondary | ICD-10-CM | POA: Diagnosis not present

## 2016-01-31 DIAGNOSIS — I10 Essential (primary) hypertension: Secondary | ICD-10-CM | POA: Diagnosis not present

## 2016-01-31 DIAGNOSIS — G309 Alzheimer's disease, unspecified: Secondary | ICD-10-CM | POA: Diagnosis not present

## 2016-01-31 DIAGNOSIS — F0281 Dementia in other diseases classified elsewhere with behavioral disturbance: Secondary | ICD-10-CM | POA: Diagnosis not present

## 2016-02-03 DIAGNOSIS — I10 Essential (primary) hypertension: Secondary | ICD-10-CM | POA: Diagnosis not present

## 2016-02-03 DIAGNOSIS — L97221 Non-pressure chronic ulcer of left calf limited to breakdown of skin: Secondary | ICD-10-CM | POA: Diagnosis not present

## 2016-02-03 DIAGNOSIS — G309 Alzheimer's disease, unspecified: Secondary | ICD-10-CM | POA: Diagnosis not present

## 2016-02-03 DIAGNOSIS — E785 Hyperlipidemia, unspecified: Secondary | ICD-10-CM | POA: Diagnosis not present

## 2016-02-03 DIAGNOSIS — F0281 Dementia in other diseases classified elsewhere with behavioral disturbance: Secondary | ICD-10-CM | POA: Diagnosis not present

## 2016-02-03 DIAGNOSIS — L97211 Non-pressure chronic ulcer of right calf limited to breakdown of skin: Secondary | ICD-10-CM | POA: Diagnosis not present

## 2016-02-06 ENCOUNTER — Encounter (HOSPITAL_BASED_OUTPATIENT_CLINIC_OR_DEPARTMENT_OTHER): Payer: PRIVATE HEALTH INSURANCE

## 2016-02-06 DIAGNOSIS — I10 Essential (primary) hypertension: Secondary | ICD-10-CM | POA: Diagnosis not present

## 2016-02-06 DIAGNOSIS — F0281 Dementia in other diseases classified elsewhere with behavioral disturbance: Secondary | ICD-10-CM | POA: Diagnosis not present

## 2016-02-06 DIAGNOSIS — L97221 Non-pressure chronic ulcer of left calf limited to breakdown of skin: Secondary | ICD-10-CM | POA: Diagnosis not present

## 2016-02-06 DIAGNOSIS — L97211 Non-pressure chronic ulcer of right calf limited to breakdown of skin: Secondary | ICD-10-CM | POA: Diagnosis not present

## 2016-02-06 DIAGNOSIS — G309 Alzheimer's disease, unspecified: Secondary | ICD-10-CM | POA: Diagnosis not present

## 2016-02-06 DIAGNOSIS — E785 Hyperlipidemia, unspecified: Secondary | ICD-10-CM | POA: Diagnosis not present

## 2016-02-10 DIAGNOSIS — L97221 Non-pressure chronic ulcer of left calf limited to breakdown of skin: Secondary | ICD-10-CM | POA: Diagnosis not present

## 2016-02-10 DIAGNOSIS — G309 Alzheimer's disease, unspecified: Secondary | ICD-10-CM | POA: Diagnosis not present

## 2016-02-10 DIAGNOSIS — L97211 Non-pressure chronic ulcer of right calf limited to breakdown of skin: Secondary | ICD-10-CM | POA: Diagnosis not present

## 2016-02-10 DIAGNOSIS — E785 Hyperlipidemia, unspecified: Secondary | ICD-10-CM | POA: Diagnosis not present

## 2016-02-10 DIAGNOSIS — I10 Essential (primary) hypertension: Secondary | ICD-10-CM | POA: Diagnosis not present

## 2016-02-10 DIAGNOSIS — F0281 Dementia in other diseases classified elsewhere with behavioral disturbance: Secondary | ICD-10-CM | POA: Diagnosis not present

## 2016-02-11 DIAGNOSIS — R197 Diarrhea, unspecified: Secondary | ICD-10-CM | POA: Diagnosis not present

## 2016-02-13 DIAGNOSIS — I10 Essential (primary) hypertension: Secondary | ICD-10-CM | POA: Diagnosis not present

## 2016-02-13 DIAGNOSIS — F0281 Dementia in other diseases classified elsewhere with behavioral disturbance: Secondary | ICD-10-CM | POA: Diagnosis not present

## 2016-02-13 DIAGNOSIS — L97211 Non-pressure chronic ulcer of right calf limited to breakdown of skin: Secondary | ICD-10-CM | POA: Diagnosis not present

## 2016-02-13 DIAGNOSIS — L97221 Non-pressure chronic ulcer of left calf limited to breakdown of skin: Secondary | ICD-10-CM | POA: Diagnosis not present

## 2016-02-13 DIAGNOSIS — E785 Hyperlipidemia, unspecified: Secondary | ICD-10-CM | POA: Diagnosis not present

## 2016-02-13 DIAGNOSIS — G309 Alzheimer's disease, unspecified: Secondary | ICD-10-CM | POA: Diagnosis not present

## 2016-02-14 DIAGNOSIS — R110 Nausea: Secondary | ICD-10-CM | POA: Diagnosis not present

## 2016-02-14 DIAGNOSIS — R197 Diarrhea, unspecified: Secondary | ICD-10-CM | POA: Diagnosis not present

## 2016-02-17 DIAGNOSIS — G309 Alzheimer's disease, unspecified: Secondary | ICD-10-CM | POA: Diagnosis not present

## 2016-02-17 DIAGNOSIS — L97221 Non-pressure chronic ulcer of left calf limited to breakdown of skin: Secondary | ICD-10-CM | POA: Diagnosis not present

## 2016-02-17 DIAGNOSIS — L97211 Non-pressure chronic ulcer of right calf limited to breakdown of skin: Secondary | ICD-10-CM | POA: Diagnosis not present

## 2016-02-17 DIAGNOSIS — E785 Hyperlipidemia, unspecified: Secondary | ICD-10-CM | POA: Diagnosis not present

## 2016-02-17 DIAGNOSIS — I10 Essential (primary) hypertension: Secondary | ICD-10-CM | POA: Diagnosis not present

## 2016-02-17 DIAGNOSIS — F0281 Dementia in other diseases classified elsewhere with behavioral disturbance: Secondary | ICD-10-CM | POA: Diagnosis not present

## 2016-02-20 ENCOUNTER — Encounter (HOSPITAL_BASED_OUTPATIENT_CLINIC_OR_DEPARTMENT_OTHER): Payer: Medicare Other | Attending: Internal Medicine

## 2016-02-20 DIAGNOSIS — I87333 Chronic venous hypertension (idiopathic) with ulcer and inflammation of bilateral lower extremity: Secondary | ICD-10-CM | POA: Insufficient documentation

## 2016-02-20 DIAGNOSIS — L97212 Non-pressure chronic ulcer of right calf with fat layer exposed: Secondary | ICD-10-CM | POA: Diagnosis not present

## 2016-02-20 DIAGNOSIS — I70242 Atherosclerosis of native arteries of left leg with ulceration of calf: Secondary | ICD-10-CM | POA: Diagnosis not present

## 2016-02-20 DIAGNOSIS — I10 Essential (primary) hypertension: Secondary | ICD-10-CM | POA: Insufficient documentation

## 2016-02-20 DIAGNOSIS — L97811 Non-pressure chronic ulcer of other part of right lower leg limited to breakdown of skin: Secondary | ICD-10-CM | POA: Diagnosis not present

## 2016-02-20 DIAGNOSIS — F0390 Unspecified dementia without behavioral disturbance: Secondary | ICD-10-CM | POA: Insufficient documentation

## 2016-02-20 DIAGNOSIS — L97221 Non-pressure chronic ulcer of left calf limited to breakdown of skin: Secondary | ICD-10-CM | POA: Diagnosis not present

## 2016-02-20 DIAGNOSIS — I70232 Atherosclerosis of native arteries of right leg with ulceration of calf: Secondary | ICD-10-CM | POA: Diagnosis not present

## 2016-02-20 DIAGNOSIS — L97211 Non-pressure chronic ulcer of right calf limited to breakdown of skin: Secondary | ICD-10-CM | POA: Diagnosis not present

## 2016-02-20 DIAGNOSIS — L97222 Non-pressure chronic ulcer of left calf with fat layer exposed: Secondary | ICD-10-CM | POA: Diagnosis not present

## 2016-02-25 DIAGNOSIS — E785 Hyperlipidemia, unspecified: Secondary | ICD-10-CM | POA: Diagnosis not present

## 2016-02-25 DIAGNOSIS — L97221 Non-pressure chronic ulcer of left calf limited to breakdown of skin: Secondary | ICD-10-CM | POA: Diagnosis not present

## 2016-02-25 DIAGNOSIS — L97211 Non-pressure chronic ulcer of right calf limited to breakdown of skin: Secondary | ICD-10-CM | POA: Diagnosis not present

## 2016-02-25 DIAGNOSIS — G309 Alzheimer's disease, unspecified: Secondary | ICD-10-CM | POA: Diagnosis not present

## 2016-02-25 DIAGNOSIS — F0281 Dementia in other diseases classified elsewhere with behavioral disturbance: Secondary | ICD-10-CM | POA: Diagnosis not present

## 2016-02-25 DIAGNOSIS — I10 Essential (primary) hypertension: Secondary | ICD-10-CM | POA: Diagnosis not present

## 2016-02-26 DIAGNOSIS — R609 Edema, unspecified: Secondary | ICD-10-CM | POA: Diagnosis not present

## 2016-02-28 DIAGNOSIS — L97221 Non-pressure chronic ulcer of left calf limited to breakdown of skin: Secondary | ICD-10-CM | POA: Diagnosis not present

## 2016-02-28 DIAGNOSIS — I10 Essential (primary) hypertension: Secondary | ICD-10-CM | POA: Diagnosis not present

## 2016-02-28 DIAGNOSIS — E785 Hyperlipidemia, unspecified: Secondary | ICD-10-CM | POA: Diagnosis not present

## 2016-02-28 DIAGNOSIS — L97211 Non-pressure chronic ulcer of right calf limited to breakdown of skin: Secondary | ICD-10-CM | POA: Diagnosis not present

## 2016-02-28 DIAGNOSIS — F0281 Dementia in other diseases classified elsewhere with behavioral disturbance: Secondary | ICD-10-CM | POA: Diagnosis not present

## 2016-02-28 DIAGNOSIS — G309 Alzheimer's disease, unspecified: Secondary | ICD-10-CM | POA: Diagnosis not present

## 2016-03-02 DIAGNOSIS — L97211 Non-pressure chronic ulcer of right calf limited to breakdown of skin: Secondary | ICD-10-CM | POA: Diagnosis not present

## 2016-03-02 DIAGNOSIS — I10 Essential (primary) hypertension: Secondary | ICD-10-CM | POA: Diagnosis not present

## 2016-03-02 DIAGNOSIS — G309 Alzheimer's disease, unspecified: Secondary | ICD-10-CM | POA: Diagnosis not present

## 2016-03-02 DIAGNOSIS — F0281 Dementia in other diseases classified elsewhere with behavioral disturbance: Secondary | ICD-10-CM | POA: Diagnosis not present

## 2016-03-02 DIAGNOSIS — E785 Hyperlipidemia, unspecified: Secondary | ICD-10-CM | POA: Diagnosis not present

## 2016-03-02 DIAGNOSIS — L97221 Non-pressure chronic ulcer of left calf limited to breakdown of skin: Secondary | ICD-10-CM | POA: Diagnosis not present

## 2016-03-03 DIAGNOSIS — R609 Edema, unspecified: Secondary | ICD-10-CM | POA: Diagnosis not present

## 2016-03-03 DIAGNOSIS — I10 Essential (primary) hypertension: Secondary | ICD-10-CM | POA: Diagnosis not present

## 2016-03-05 ENCOUNTER — Encounter (HOSPITAL_BASED_OUTPATIENT_CLINIC_OR_DEPARTMENT_OTHER): Payer: Medicare Other | Attending: Internal Medicine

## 2016-03-05 DIAGNOSIS — I10 Essential (primary) hypertension: Secondary | ICD-10-CM | POA: Insufficient documentation

## 2016-03-05 DIAGNOSIS — L97212 Non-pressure chronic ulcer of right calf with fat layer exposed: Secondary | ICD-10-CM | POA: Diagnosis not present

## 2016-03-05 DIAGNOSIS — L97811 Non-pressure chronic ulcer of other part of right lower leg limited to breakdown of skin: Secondary | ICD-10-CM | POA: Diagnosis not present

## 2016-03-05 DIAGNOSIS — H353231 Exudative age-related macular degeneration, bilateral, with active choroidal neovascularization: Secondary | ICD-10-CM | POA: Diagnosis not present

## 2016-03-05 DIAGNOSIS — H401114 Primary open-angle glaucoma, right eye, indeterminate stage: Secondary | ICD-10-CM | POA: Diagnosis not present

## 2016-03-05 DIAGNOSIS — H401123 Primary open-angle glaucoma, left eye, severe stage: Secondary | ICD-10-CM | POA: Diagnosis not present

## 2016-03-05 DIAGNOSIS — L97221 Non-pressure chronic ulcer of left calf limited to breakdown of skin: Secondary | ICD-10-CM | POA: Insufficient documentation

## 2016-03-05 DIAGNOSIS — Z961 Presence of intraocular lens: Secondary | ICD-10-CM | POA: Diagnosis not present

## 2016-03-05 DIAGNOSIS — F0390 Unspecified dementia without behavioral disturbance: Secondary | ICD-10-CM | POA: Insufficient documentation

## 2016-03-05 DIAGNOSIS — I87333 Chronic venous hypertension (idiopathic) with ulcer and inflammation of bilateral lower extremity: Secondary | ICD-10-CM | POA: Diagnosis not present

## 2016-03-05 DIAGNOSIS — I70238 Atherosclerosis of native arteries of right leg with ulceration of other part of lower right leg: Secondary | ICD-10-CM | POA: Diagnosis not present

## 2016-03-05 DIAGNOSIS — L97822 Non-pressure chronic ulcer of other part of left lower leg with fat layer exposed: Secondary | ICD-10-CM | POA: Insufficient documentation

## 2016-03-05 DIAGNOSIS — L97211 Non-pressure chronic ulcer of right calf limited to breakdown of skin: Secondary | ICD-10-CM | POA: Diagnosis not present

## 2016-03-05 DIAGNOSIS — L97812 Non-pressure chronic ulcer of other part of right lower leg with fat layer exposed: Secondary | ICD-10-CM | POA: Diagnosis not present

## 2016-03-05 DIAGNOSIS — I70232 Atherosclerosis of native arteries of right leg with ulceration of calf: Secondary | ICD-10-CM | POA: Diagnosis not present

## 2016-03-09 DIAGNOSIS — H401114 Primary open-angle glaucoma, right eye, indeterminate stage: Secondary | ICD-10-CM | POA: Diagnosis not present

## 2016-03-09 DIAGNOSIS — F0281 Dementia in other diseases classified elsewhere with behavioral disturbance: Secondary | ICD-10-CM | POA: Diagnosis not present

## 2016-03-09 DIAGNOSIS — G309 Alzheimer's disease, unspecified: Secondary | ICD-10-CM | POA: Diagnosis not present

## 2016-03-09 DIAGNOSIS — L97211 Non-pressure chronic ulcer of right calf limited to breakdown of skin: Secondary | ICD-10-CM | POA: Diagnosis not present

## 2016-03-09 DIAGNOSIS — L97221 Non-pressure chronic ulcer of left calf limited to breakdown of skin: Secondary | ICD-10-CM | POA: Diagnosis not present

## 2016-03-09 DIAGNOSIS — I10 Essential (primary) hypertension: Secondary | ICD-10-CM | POA: Diagnosis not present

## 2016-03-09 DIAGNOSIS — E785 Hyperlipidemia, unspecified: Secondary | ICD-10-CM | POA: Diagnosis not present

## 2016-03-09 DIAGNOSIS — H401123 Primary open-angle glaucoma, left eye, severe stage: Secondary | ICD-10-CM | POA: Diagnosis not present

## 2016-03-11 DIAGNOSIS — E785 Hyperlipidemia, unspecified: Secondary | ICD-10-CM | POA: Diagnosis not present

## 2016-03-11 DIAGNOSIS — L97221 Non-pressure chronic ulcer of left calf limited to breakdown of skin: Secondary | ICD-10-CM | POA: Diagnosis not present

## 2016-03-11 DIAGNOSIS — I10 Essential (primary) hypertension: Secondary | ICD-10-CM | POA: Diagnosis not present

## 2016-03-11 DIAGNOSIS — G309 Alzheimer's disease, unspecified: Secondary | ICD-10-CM | POA: Diagnosis not present

## 2016-03-11 DIAGNOSIS — F0281 Dementia in other diseases classified elsewhere with behavioral disturbance: Secondary | ICD-10-CM | POA: Diagnosis not present

## 2016-03-11 DIAGNOSIS — L97211 Non-pressure chronic ulcer of right calf limited to breakdown of skin: Secondary | ICD-10-CM | POA: Diagnosis not present

## 2016-03-13 DIAGNOSIS — L97221 Non-pressure chronic ulcer of left calf limited to breakdown of skin: Secondary | ICD-10-CM | POA: Diagnosis not present

## 2016-03-13 DIAGNOSIS — G309 Alzheimer's disease, unspecified: Secondary | ICD-10-CM | POA: Diagnosis not present

## 2016-03-13 DIAGNOSIS — F0281 Dementia in other diseases classified elsewhere with behavioral disturbance: Secondary | ICD-10-CM | POA: Diagnosis not present

## 2016-03-13 DIAGNOSIS — E785 Hyperlipidemia, unspecified: Secondary | ICD-10-CM | POA: Diagnosis not present

## 2016-03-13 DIAGNOSIS — I10 Essential (primary) hypertension: Secondary | ICD-10-CM | POA: Diagnosis not present

## 2016-03-13 DIAGNOSIS — L97211 Non-pressure chronic ulcer of right calf limited to breakdown of skin: Secondary | ICD-10-CM | POA: Diagnosis not present

## 2016-03-16 DIAGNOSIS — E785 Hyperlipidemia, unspecified: Secondary | ICD-10-CM | POA: Diagnosis not present

## 2016-03-16 DIAGNOSIS — I10 Essential (primary) hypertension: Secondary | ICD-10-CM | POA: Diagnosis not present

## 2016-03-16 DIAGNOSIS — F0281 Dementia in other diseases classified elsewhere with behavioral disturbance: Secondary | ICD-10-CM | POA: Diagnosis not present

## 2016-03-16 DIAGNOSIS — G309 Alzheimer's disease, unspecified: Secondary | ICD-10-CM | POA: Diagnosis not present

## 2016-03-16 DIAGNOSIS — L97221 Non-pressure chronic ulcer of left calf limited to breakdown of skin: Secondary | ICD-10-CM | POA: Diagnosis not present

## 2016-03-16 DIAGNOSIS — L97211 Non-pressure chronic ulcer of right calf limited to breakdown of skin: Secondary | ICD-10-CM | POA: Diagnosis not present

## 2016-03-20 DIAGNOSIS — L97221 Non-pressure chronic ulcer of left calf limited to breakdown of skin: Secondary | ICD-10-CM | POA: Diagnosis not present

## 2016-03-20 DIAGNOSIS — E785 Hyperlipidemia, unspecified: Secondary | ICD-10-CM | POA: Diagnosis not present

## 2016-03-20 DIAGNOSIS — G309 Alzheimer's disease, unspecified: Secondary | ICD-10-CM | POA: Diagnosis not present

## 2016-03-20 DIAGNOSIS — F0281 Dementia in other diseases classified elsewhere with behavioral disturbance: Secondary | ICD-10-CM | POA: Diagnosis not present

## 2016-03-20 DIAGNOSIS — L97211 Non-pressure chronic ulcer of right calf limited to breakdown of skin: Secondary | ICD-10-CM | POA: Diagnosis not present

## 2016-03-20 DIAGNOSIS — I10 Essential (primary) hypertension: Secondary | ICD-10-CM | POA: Diagnosis not present

## 2016-03-22 DIAGNOSIS — L97221 Non-pressure chronic ulcer of left calf limited to breakdown of skin: Secondary | ICD-10-CM | POA: Diagnosis not present

## 2016-03-22 DIAGNOSIS — I10 Essential (primary) hypertension: Secondary | ICD-10-CM | POA: Diagnosis not present

## 2016-03-22 DIAGNOSIS — L97211 Non-pressure chronic ulcer of right calf limited to breakdown of skin: Secondary | ICD-10-CM | POA: Diagnosis not present

## 2016-03-22 DIAGNOSIS — G309 Alzheimer's disease, unspecified: Secondary | ICD-10-CM | POA: Diagnosis not present

## 2016-03-22 DIAGNOSIS — E785 Hyperlipidemia, unspecified: Secondary | ICD-10-CM | POA: Diagnosis not present

## 2016-03-22 DIAGNOSIS — F0281 Dementia in other diseases classified elsewhere with behavioral disturbance: Secondary | ICD-10-CM | POA: Diagnosis not present

## 2016-03-23 DIAGNOSIS — L97221 Non-pressure chronic ulcer of left calf limited to breakdown of skin: Secondary | ICD-10-CM | POA: Diagnosis not present

## 2016-03-23 DIAGNOSIS — E785 Hyperlipidemia, unspecified: Secondary | ICD-10-CM | POA: Diagnosis not present

## 2016-03-23 DIAGNOSIS — F0281 Dementia in other diseases classified elsewhere with behavioral disturbance: Secondary | ICD-10-CM | POA: Diagnosis not present

## 2016-03-23 DIAGNOSIS — L97211 Non-pressure chronic ulcer of right calf limited to breakdown of skin: Secondary | ICD-10-CM | POA: Diagnosis not present

## 2016-03-23 DIAGNOSIS — I10 Essential (primary) hypertension: Secondary | ICD-10-CM | POA: Diagnosis not present

## 2016-03-23 DIAGNOSIS — G309 Alzheimer's disease, unspecified: Secondary | ICD-10-CM | POA: Diagnosis not present

## 2016-03-25 DIAGNOSIS — G309 Alzheimer's disease, unspecified: Secondary | ICD-10-CM | POA: Diagnosis not present

## 2016-03-25 DIAGNOSIS — F0281 Dementia in other diseases classified elsewhere with behavioral disturbance: Secondary | ICD-10-CM | POA: Diagnosis not present

## 2016-03-25 DIAGNOSIS — I10 Essential (primary) hypertension: Secondary | ICD-10-CM | POA: Diagnosis not present

## 2016-03-25 DIAGNOSIS — E785 Hyperlipidemia, unspecified: Secondary | ICD-10-CM | POA: Diagnosis not present

## 2016-03-25 DIAGNOSIS — L97211 Non-pressure chronic ulcer of right calf limited to breakdown of skin: Secondary | ICD-10-CM | POA: Diagnosis not present

## 2016-03-25 DIAGNOSIS — L97221 Non-pressure chronic ulcer of left calf limited to breakdown of skin: Secondary | ICD-10-CM | POA: Diagnosis not present

## 2016-03-26 DIAGNOSIS — L97221 Non-pressure chronic ulcer of left calf limited to breakdown of skin: Secondary | ICD-10-CM | POA: Diagnosis not present

## 2016-03-26 DIAGNOSIS — I87333 Chronic venous hypertension (idiopathic) with ulcer and inflammation of bilateral lower extremity: Secondary | ICD-10-CM | POA: Diagnosis not present

## 2016-03-26 DIAGNOSIS — L97211 Non-pressure chronic ulcer of right calf limited to breakdown of skin: Secondary | ICD-10-CM | POA: Diagnosis not present

## 2016-03-26 DIAGNOSIS — L97822 Non-pressure chronic ulcer of other part of left lower leg with fat layer exposed: Secondary | ICD-10-CM | POA: Diagnosis not present

## 2016-03-26 DIAGNOSIS — S81801A Unspecified open wound, right lower leg, initial encounter: Secondary | ICD-10-CM | POA: Diagnosis not present

## 2016-03-26 DIAGNOSIS — S81802A Unspecified open wound, left lower leg, initial encounter: Secondary | ICD-10-CM | POA: Diagnosis not present

## 2016-03-26 DIAGNOSIS — L97811 Non-pressure chronic ulcer of other part of right lower leg limited to breakdown of skin: Secondary | ICD-10-CM | POA: Diagnosis not present

## 2016-03-26 DIAGNOSIS — F0390 Unspecified dementia without behavioral disturbance: Secondary | ICD-10-CM | POA: Diagnosis not present

## 2016-03-26 DIAGNOSIS — I10 Essential (primary) hypertension: Secondary | ICD-10-CM | POA: Diagnosis not present

## 2016-03-30 DIAGNOSIS — E785 Hyperlipidemia, unspecified: Secondary | ICD-10-CM | POA: Diagnosis not present

## 2016-03-30 DIAGNOSIS — L97211 Non-pressure chronic ulcer of right calf limited to breakdown of skin: Secondary | ICD-10-CM | POA: Diagnosis not present

## 2016-03-30 DIAGNOSIS — I10 Essential (primary) hypertension: Secondary | ICD-10-CM | POA: Diagnosis not present

## 2016-03-30 DIAGNOSIS — G309 Alzheimer's disease, unspecified: Secondary | ICD-10-CM | POA: Diagnosis not present

## 2016-03-30 DIAGNOSIS — F0281 Dementia in other diseases classified elsewhere with behavioral disturbance: Secondary | ICD-10-CM | POA: Diagnosis not present

## 2016-03-30 DIAGNOSIS — L97221 Non-pressure chronic ulcer of left calf limited to breakdown of skin: Secondary | ICD-10-CM | POA: Diagnosis not present

## 2016-03-31 ENCOUNTER — Ambulatory Visit (HOSPITAL_COMMUNITY)
Admission: RE | Admit: 2016-03-31 | Discharge: 2016-03-31 | Disposition: A | Discharge status: Home / Self Care | Payer: Medicare Other | Source: Ambulatory Visit | Attending: Vascular Surgery | Admitting: Vascular Surgery

## 2016-03-31 ENCOUNTER — Other Ambulatory Visit: Payer: Self-pay | Admitting: Internal Medicine

## 2016-03-31 DIAGNOSIS — L97919 Non-pressure chronic ulcer of unspecified part of right lower leg with unspecified severity: Secondary | ICD-10-CM

## 2016-03-31 DIAGNOSIS — I87333 Chronic venous hypertension (idiopathic) with ulcer and inflammation of bilateral lower extremity: Secondary | ICD-10-CM | POA: Insufficient documentation

## 2016-03-31 DIAGNOSIS — L97929 Non-pressure chronic ulcer of unspecified part of left lower leg with unspecified severity: Principal | ICD-10-CM

## 2016-03-31 DIAGNOSIS — L97211 Non-pressure chronic ulcer of right calf limited to breakdown of skin: Secondary | ICD-10-CM

## 2016-04-01 DIAGNOSIS — R0982 Postnasal drip: Secondary | ICD-10-CM | POA: Diagnosis not present

## 2016-04-01 DIAGNOSIS — L97221 Non-pressure chronic ulcer of left calf limited to breakdown of skin: Secondary | ICD-10-CM | POA: Diagnosis not present

## 2016-04-01 DIAGNOSIS — I10 Essential (primary) hypertension: Secondary | ICD-10-CM | POA: Diagnosis not present

## 2016-04-01 DIAGNOSIS — J309 Allergic rhinitis, unspecified: Secondary | ICD-10-CM | POA: Diagnosis not present

## 2016-04-01 DIAGNOSIS — F0281 Dementia in other diseases classified elsewhere with behavioral disturbance: Secondary | ICD-10-CM | POA: Diagnosis not present

## 2016-04-01 DIAGNOSIS — G309 Alzheimer's disease, unspecified: Secondary | ICD-10-CM | POA: Diagnosis not present

## 2016-04-01 DIAGNOSIS — E785 Hyperlipidemia, unspecified: Secondary | ICD-10-CM | POA: Diagnosis not present

## 2016-04-01 DIAGNOSIS — L97211 Non-pressure chronic ulcer of right calf limited to breakdown of skin: Secondary | ICD-10-CM | POA: Diagnosis not present

## 2016-04-02 ENCOUNTER — Ambulatory Visit (HOSPITAL_COMMUNITY)
Admission: RE | Admit: 2016-04-02 | Discharge: 2016-04-02 | Disposition: A | Discharge status: Home / Self Care | Payer: Medicare Other | Source: Ambulatory Visit | Attending: Vascular Surgery | Admitting: Vascular Surgery

## 2016-04-02 ENCOUNTER — Other Ambulatory Visit: Payer: Self-pay | Admitting: Internal Medicine

## 2016-04-02 DIAGNOSIS — F0281 Dementia in other diseases classified elsewhere with behavioral disturbance: Secondary | ICD-10-CM | POA: Diagnosis not present

## 2016-04-02 DIAGNOSIS — E785 Hyperlipidemia, unspecified: Secondary | ICD-10-CM | POA: Diagnosis not present

## 2016-04-02 DIAGNOSIS — I87333 Chronic venous hypertension (idiopathic) with ulcer and inflammation of bilateral lower extremity: Secondary | ICD-10-CM | POA: Diagnosis not present

## 2016-04-02 DIAGNOSIS — L97211 Non-pressure chronic ulcer of right calf limited to breakdown of skin: Secondary | ICD-10-CM | POA: Insufficient documentation

## 2016-04-02 DIAGNOSIS — G309 Alzheimer's disease, unspecified: Secondary | ICD-10-CM | POA: Diagnosis not present

## 2016-04-02 DIAGNOSIS — I10 Essential (primary) hypertension: Secondary | ICD-10-CM | POA: Diagnosis not present

## 2016-04-02 DIAGNOSIS — L97221 Non-pressure chronic ulcer of left calf limited to breakdown of skin: Secondary | ICD-10-CM | POA: Diagnosis not present

## 2016-04-03 DIAGNOSIS — I10 Essential (primary) hypertension: Secondary | ICD-10-CM | POA: Diagnosis not present

## 2016-04-03 DIAGNOSIS — E785 Hyperlipidemia, unspecified: Secondary | ICD-10-CM | POA: Diagnosis not present

## 2016-04-03 DIAGNOSIS — L97221 Non-pressure chronic ulcer of left calf limited to breakdown of skin: Secondary | ICD-10-CM | POA: Diagnosis not present

## 2016-04-03 DIAGNOSIS — F0281 Dementia in other diseases classified elsewhere with behavioral disturbance: Secondary | ICD-10-CM | POA: Diagnosis not present

## 2016-04-03 DIAGNOSIS — L97211 Non-pressure chronic ulcer of right calf limited to breakdown of skin: Secondary | ICD-10-CM | POA: Diagnosis not present

## 2016-04-03 DIAGNOSIS — G309 Alzheimer's disease, unspecified: Secondary | ICD-10-CM | POA: Diagnosis not present

## 2016-04-06 DIAGNOSIS — I10 Essential (primary) hypertension: Secondary | ICD-10-CM | POA: Diagnosis not present

## 2016-04-06 DIAGNOSIS — E785 Hyperlipidemia, unspecified: Secondary | ICD-10-CM | POA: Diagnosis not present

## 2016-04-06 DIAGNOSIS — L97221 Non-pressure chronic ulcer of left calf limited to breakdown of skin: Secondary | ICD-10-CM | POA: Diagnosis not present

## 2016-04-06 DIAGNOSIS — H401114 Primary open-angle glaucoma, right eye, indeterminate stage: Secondary | ICD-10-CM | POA: Diagnosis not present

## 2016-04-06 DIAGNOSIS — F0281 Dementia in other diseases classified elsewhere with behavioral disturbance: Secondary | ICD-10-CM | POA: Diagnosis not present

## 2016-04-06 DIAGNOSIS — H401123 Primary open-angle glaucoma, left eye, severe stage: Secondary | ICD-10-CM | POA: Diagnosis not present

## 2016-04-06 DIAGNOSIS — G309 Alzheimer's disease, unspecified: Secondary | ICD-10-CM | POA: Diagnosis not present

## 2016-04-06 DIAGNOSIS — L97211 Non-pressure chronic ulcer of right calf limited to breakdown of skin: Secondary | ICD-10-CM | POA: Diagnosis not present

## 2016-04-09 ENCOUNTER — Encounter (HOSPITAL_BASED_OUTPATIENT_CLINIC_OR_DEPARTMENT_OTHER): Payer: Medicare Other

## 2016-04-09 ENCOUNTER — Encounter (HOSPITAL_BASED_OUTPATIENT_CLINIC_OR_DEPARTMENT_OTHER): Payer: Medicare Other | Attending: Internal Medicine

## 2016-04-09 DIAGNOSIS — L97221 Non-pressure chronic ulcer of left calf limited to breakdown of skin: Secondary | ICD-10-CM | POA: Insufficient documentation

## 2016-04-09 DIAGNOSIS — L97811 Non-pressure chronic ulcer of other part of right lower leg limited to breakdown of skin: Secondary | ICD-10-CM | POA: Diagnosis not present

## 2016-04-09 DIAGNOSIS — I10 Essential (primary) hypertension: Secondary | ICD-10-CM | POA: Diagnosis not present

## 2016-04-09 DIAGNOSIS — S81802A Unspecified open wound, left lower leg, initial encounter: Secondary | ICD-10-CM | POA: Diagnosis not present

## 2016-04-09 DIAGNOSIS — S81801A Unspecified open wound, right lower leg, initial encounter: Secondary | ICD-10-CM | POA: Diagnosis not present

## 2016-04-09 DIAGNOSIS — I87333 Chronic venous hypertension (idiopathic) with ulcer and inflammation of bilateral lower extremity: Secondary | ICD-10-CM | POA: Diagnosis not present

## 2016-04-09 DIAGNOSIS — F0390 Unspecified dementia without behavioral disturbance: Secondary | ICD-10-CM | POA: Insufficient documentation

## 2016-04-09 DIAGNOSIS — L97821 Non-pressure chronic ulcer of other part of left lower leg limited to breakdown of skin: Secondary | ICD-10-CM | POA: Diagnosis not present

## 2016-04-13 DIAGNOSIS — I10 Essential (primary) hypertension: Secondary | ICD-10-CM | POA: Diagnosis not present

## 2016-04-13 DIAGNOSIS — F0281 Dementia in other diseases classified elsewhere with behavioral disturbance: Secondary | ICD-10-CM | POA: Diagnosis not present

## 2016-04-13 DIAGNOSIS — L97221 Non-pressure chronic ulcer of left calf limited to breakdown of skin: Secondary | ICD-10-CM | POA: Diagnosis not present

## 2016-04-13 DIAGNOSIS — G309 Alzheimer's disease, unspecified: Secondary | ICD-10-CM | POA: Diagnosis not present

## 2016-04-13 DIAGNOSIS — L97211 Non-pressure chronic ulcer of right calf limited to breakdown of skin: Secondary | ICD-10-CM | POA: Diagnosis not present

## 2016-04-13 DIAGNOSIS — E785 Hyperlipidemia, unspecified: Secondary | ICD-10-CM | POA: Diagnosis not present

## 2016-04-15 DIAGNOSIS — L97211 Non-pressure chronic ulcer of right calf limited to breakdown of skin: Secondary | ICD-10-CM | POA: Diagnosis not present

## 2016-04-15 DIAGNOSIS — E785 Hyperlipidemia, unspecified: Secondary | ICD-10-CM | POA: Diagnosis not present

## 2016-04-15 DIAGNOSIS — L97221 Non-pressure chronic ulcer of left calf limited to breakdown of skin: Secondary | ICD-10-CM | POA: Diagnosis not present

## 2016-04-15 DIAGNOSIS — G309 Alzheimer's disease, unspecified: Secondary | ICD-10-CM | POA: Diagnosis not present

## 2016-04-15 DIAGNOSIS — F0281 Dementia in other diseases classified elsewhere with behavioral disturbance: Secondary | ICD-10-CM | POA: Diagnosis not present

## 2016-04-15 DIAGNOSIS — I10 Essential (primary) hypertension: Secondary | ICD-10-CM | POA: Diagnosis not present

## 2016-04-17 DIAGNOSIS — G309 Alzheimer's disease, unspecified: Secondary | ICD-10-CM | POA: Diagnosis not present

## 2016-04-17 DIAGNOSIS — L97211 Non-pressure chronic ulcer of right calf limited to breakdown of skin: Secondary | ICD-10-CM | POA: Diagnosis not present

## 2016-04-17 DIAGNOSIS — F0281 Dementia in other diseases classified elsewhere with behavioral disturbance: Secondary | ICD-10-CM | POA: Diagnosis not present

## 2016-04-17 DIAGNOSIS — E785 Hyperlipidemia, unspecified: Secondary | ICD-10-CM | POA: Diagnosis not present

## 2016-04-17 DIAGNOSIS — I10 Essential (primary) hypertension: Secondary | ICD-10-CM | POA: Diagnosis not present

## 2016-04-17 DIAGNOSIS — L97221 Non-pressure chronic ulcer of left calf limited to breakdown of skin: Secondary | ICD-10-CM | POA: Diagnosis not present

## 2016-04-20 DIAGNOSIS — L97211 Non-pressure chronic ulcer of right calf limited to breakdown of skin: Secondary | ICD-10-CM | POA: Diagnosis not present

## 2016-04-20 DIAGNOSIS — G309 Alzheimer's disease, unspecified: Secondary | ICD-10-CM | POA: Diagnosis not present

## 2016-04-20 DIAGNOSIS — I10 Essential (primary) hypertension: Secondary | ICD-10-CM | POA: Diagnosis not present

## 2016-04-20 DIAGNOSIS — F0281 Dementia in other diseases classified elsewhere with behavioral disturbance: Secondary | ICD-10-CM | POA: Diagnosis not present

## 2016-04-20 DIAGNOSIS — L97221 Non-pressure chronic ulcer of left calf limited to breakdown of skin: Secondary | ICD-10-CM | POA: Diagnosis not present

## 2016-04-20 DIAGNOSIS — E785 Hyperlipidemia, unspecified: Secondary | ICD-10-CM | POA: Diagnosis not present

## 2016-04-23 DIAGNOSIS — I10 Essential (primary) hypertension: Secondary | ICD-10-CM | POA: Diagnosis not present

## 2016-04-23 DIAGNOSIS — S81802A Unspecified open wound, left lower leg, initial encounter: Secondary | ICD-10-CM | POA: Diagnosis not present

## 2016-04-23 DIAGNOSIS — L97811 Non-pressure chronic ulcer of other part of right lower leg limited to breakdown of skin: Secondary | ICD-10-CM | POA: Diagnosis not present

## 2016-04-23 DIAGNOSIS — S81801A Unspecified open wound, right lower leg, initial encounter: Secondary | ICD-10-CM | POA: Diagnosis not present

## 2016-04-23 DIAGNOSIS — L97221 Non-pressure chronic ulcer of left calf limited to breakdown of skin: Secondary | ICD-10-CM | POA: Diagnosis not present

## 2016-04-23 DIAGNOSIS — F0390 Unspecified dementia without behavioral disturbance: Secondary | ICD-10-CM | POA: Diagnosis not present

## 2016-04-23 DIAGNOSIS — I87333 Chronic venous hypertension (idiopathic) with ulcer and inflammation of bilateral lower extremity: Secondary | ICD-10-CM | POA: Diagnosis not present

## 2016-04-23 DIAGNOSIS — S81811A Laceration without foreign body, right lower leg, initial encounter: Secondary | ICD-10-CM | POA: Diagnosis not present

## 2016-04-23 DIAGNOSIS — L97821 Non-pressure chronic ulcer of other part of left lower leg limited to breakdown of skin: Secondary | ICD-10-CM | POA: Diagnosis not present

## 2016-04-23 MED FILL — FLUCONAZOLE 100 MG TABLET: 100 | 8 days supply | Qty: 4 | Fill #0

## 2016-04-25 DIAGNOSIS — F0281 Dementia in other diseases classified elsewhere with behavioral disturbance: Secondary | ICD-10-CM | POA: Diagnosis not present

## 2016-04-25 DIAGNOSIS — E785 Hyperlipidemia, unspecified: Secondary | ICD-10-CM | POA: Diagnosis not present

## 2016-04-25 DIAGNOSIS — G309 Alzheimer's disease, unspecified: Secondary | ICD-10-CM | POA: Diagnosis not present

## 2016-04-25 DIAGNOSIS — L97221 Non-pressure chronic ulcer of left calf limited to breakdown of skin: Secondary | ICD-10-CM | POA: Diagnosis not present

## 2016-04-25 DIAGNOSIS — I10 Essential (primary) hypertension: Secondary | ICD-10-CM | POA: Diagnosis not present

## 2016-04-25 DIAGNOSIS — L97211 Non-pressure chronic ulcer of right calf limited to breakdown of skin: Secondary | ICD-10-CM | POA: Diagnosis not present

## 2016-04-27 DIAGNOSIS — G309 Alzheimer's disease, unspecified: Secondary | ICD-10-CM | POA: Diagnosis not present

## 2016-04-27 DIAGNOSIS — L97221 Non-pressure chronic ulcer of left calf limited to breakdown of skin: Secondary | ICD-10-CM | POA: Diagnosis not present

## 2016-04-27 DIAGNOSIS — F0281 Dementia in other diseases classified elsewhere with behavioral disturbance: Secondary | ICD-10-CM | POA: Diagnosis not present

## 2016-04-27 DIAGNOSIS — I10 Essential (primary) hypertension: Secondary | ICD-10-CM | POA: Diagnosis not present

## 2016-04-27 DIAGNOSIS — L97211 Non-pressure chronic ulcer of right calf limited to breakdown of skin: Secondary | ICD-10-CM | POA: Diagnosis not present

## 2016-04-27 DIAGNOSIS — E785 Hyperlipidemia, unspecified: Secondary | ICD-10-CM | POA: Diagnosis not present

## 2016-04-29 DIAGNOSIS — L97211 Non-pressure chronic ulcer of right calf limited to breakdown of skin: Secondary | ICD-10-CM | POA: Diagnosis not present

## 2016-04-29 DIAGNOSIS — E785 Hyperlipidemia, unspecified: Secondary | ICD-10-CM | POA: Diagnosis not present

## 2016-04-29 DIAGNOSIS — I10 Essential (primary) hypertension: Secondary | ICD-10-CM | POA: Diagnosis not present

## 2016-04-29 DIAGNOSIS — G309 Alzheimer's disease, unspecified: Secondary | ICD-10-CM | POA: Diagnosis not present

## 2016-04-29 DIAGNOSIS — L97221 Non-pressure chronic ulcer of left calf limited to breakdown of skin: Secondary | ICD-10-CM | POA: Diagnosis not present

## 2016-04-29 DIAGNOSIS — F0281 Dementia in other diseases classified elsewhere with behavioral disturbance: Secondary | ICD-10-CM | POA: Diagnosis not present

## 2016-04-30 ENCOUNTER — Encounter (HOSPITAL_BASED_OUTPATIENT_CLINIC_OR_DEPARTMENT_OTHER): Payer: Medicare Other | Attending: Internal Medicine

## 2016-04-30 DIAGNOSIS — S81801A Unspecified open wound, right lower leg, initial encounter: Secondary | ICD-10-CM | POA: Diagnosis not present

## 2016-04-30 DIAGNOSIS — I10 Essential (primary) hypertension: Secondary | ICD-10-CM | POA: Insufficient documentation

## 2016-04-30 DIAGNOSIS — I87333 Chronic venous hypertension (idiopathic) with ulcer and inflammation of bilateral lower extremity: Secondary | ICD-10-CM | POA: Insufficient documentation

## 2016-04-30 DIAGNOSIS — S81811A Laceration without foreign body, right lower leg, initial encounter: Secondary | ICD-10-CM | POA: Diagnosis not present

## 2016-04-30 DIAGNOSIS — L97812 Non-pressure chronic ulcer of other part of right lower leg with fat layer exposed: Secondary | ICD-10-CM | POA: Insufficient documentation

## 2016-04-30 DIAGNOSIS — L97822 Non-pressure chronic ulcer of other part of left lower leg with fat layer exposed: Secondary | ICD-10-CM | POA: Diagnosis not present

## 2016-04-30 DIAGNOSIS — L97222 Non-pressure chronic ulcer of left calf with fat layer exposed: Secondary | ICD-10-CM | POA: Diagnosis not present

## 2016-04-30 DIAGNOSIS — F0390 Unspecified dementia without behavioral disturbance: Secondary | ICD-10-CM | POA: Diagnosis not present

## 2016-04-30 DIAGNOSIS — S81802A Unspecified open wound, left lower leg, initial encounter: Secondary | ICD-10-CM | POA: Diagnosis not present

## 2016-05-02 DIAGNOSIS — L97211 Non-pressure chronic ulcer of right calf limited to breakdown of skin: Secondary | ICD-10-CM | POA: Diagnosis not present

## 2016-05-02 DIAGNOSIS — I10 Essential (primary) hypertension: Secondary | ICD-10-CM | POA: Diagnosis not present

## 2016-05-02 DIAGNOSIS — G309 Alzheimer's disease, unspecified: Secondary | ICD-10-CM | POA: Diagnosis not present

## 2016-05-02 DIAGNOSIS — F0281 Dementia in other diseases classified elsewhere with behavioral disturbance: Secondary | ICD-10-CM | POA: Diagnosis not present

## 2016-05-02 DIAGNOSIS — E785 Hyperlipidemia, unspecified: Secondary | ICD-10-CM | POA: Diagnosis not present

## 2016-05-02 DIAGNOSIS — L97221 Non-pressure chronic ulcer of left calf limited to breakdown of skin: Secondary | ICD-10-CM | POA: Diagnosis not present

## 2016-05-04 DIAGNOSIS — L97221 Non-pressure chronic ulcer of left calf limited to breakdown of skin: Secondary | ICD-10-CM | POA: Diagnosis not present

## 2016-05-04 DIAGNOSIS — F0281 Dementia in other diseases classified elsewhere with behavioral disturbance: Secondary | ICD-10-CM | POA: Diagnosis not present

## 2016-05-04 DIAGNOSIS — G309 Alzheimer's disease, unspecified: Secondary | ICD-10-CM | POA: Diagnosis not present

## 2016-05-04 DIAGNOSIS — L97211 Non-pressure chronic ulcer of right calf limited to breakdown of skin: Secondary | ICD-10-CM | POA: Diagnosis not present

## 2016-05-04 DIAGNOSIS — I10 Essential (primary) hypertension: Secondary | ICD-10-CM | POA: Diagnosis not present

## 2016-05-04 DIAGNOSIS — E785 Hyperlipidemia, unspecified: Secondary | ICD-10-CM | POA: Diagnosis not present

## 2016-05-06 DIAGNOSIS — L97211 Non-pressure chronic ulcer of right calf limited to breakdown of skin: Secondary | ICD-10-CM | POA: Diagnosis not present

## 2016-05-06 DIAGNOSIS — F0281 Dementia in other diseases classified elsewhere with behavioral disturbance: Secondary | ICD-10-CM | POA: Diagnosis not present

## 2016-05-06 DIAGNOSIS — L97221 Non-pressure chronic ulcer of left calf limited to breakdown of skin: Secondary | ICD-10-CM | POA: Diagnosis not present

## 2016-05-06 DIAGNOSIS — G309 Alzheimer's disease, unspecified: Secondary | ICD-10-CM | POA: Diagnosis not present

## 2016-05-06 DIAGNOSIS — I10 Essential (primary) hypertension: Secondary | ICD-10-CM | POA: Diagnosis not present

## 2016-05-06 DIAGNOSIS — E785 Hyperlipidemia, unspecified: Secondary | ICD-10-CM | POA: Diagnosis not present

## 2016-05-09 DIAGNOSIS — E785 Hyperlipidemia, unspecified: Secondary | ICD-10-CM | POA: Diagnosis not present

## 2016-05-09 DIAGNOSIS — G309 Alzheimer's disease, unspecified: Secondary | ICD-10-CM | POA: Diagnosis not present

## 2016-05-09 DIAGNOSIS — L97221 Non-pressure chronic ulcer of left calf limited to breakdown of skin: Secondary | ICD-10-CM | POA: Diagnosis not present

## 2016-05-09 DIAGNOSIS — I10 Essential (primary) hypertension: Secondary | ICD-10-CM | POA: Diagnosis not present

## 2016-05-09 DIAGNOSIS — L97211 Non-pressure chronic ulcer of right calf limited to breakdown of skin: Secondary | ICD-10-CM | POA: Diagnosis not present

## 2016-05-09 DIAGNOSIS — F0281 Dementia in other diseases classified elsewhere with behavioral disturbance: Secondary | ICD-10-CM | POA: Diagnosis not present

## 2016-05-11 DIAGNOSIS — E785 Hyperlipidemia, unspecified: Secondary | ICD-10-CM | POA: Diagnosis not present

## 2016-05-11 DIAGNOSIS — L97211 Non-pressure chronic ulcer of right calf limited to breakdown of skin: Secondary | ICD-10-CM | POA: Diagnosis not present

## 2016-05-11 DIAGNOSIS — I10 Essential (primary) hypertension: Secondary | ICD-10-CM | POA: Diagnosis not present

## 2016-05-11 DIAGNOSIS — G309 Alzheimer's disease, unspecified: Secondary | ICD-10-CM | POA: Diagnosis not present

## 2016-05-11 DIAGNOSIS — L97221 Non-pressure chronic ulcer of left calf limited to breakdown of skin: Secondary | ICD-10-CM | POA: Diagnosis not present

## 2016-05-11 DIAGNOSIS — F0281 Dementia in other diseases classified elsewhere with behavioral disturbance: Secondary | ICD-10-CM | POA: Diagnosis not present

## 2016-05-13 DIAGNOSIS — F0281 Dementia in other diseases classified elsewhere with behavioral disturbance: Secondary | ICD-10-CM | POA: Diagnosis not present

## 2016-05-13 DIAGNOSIS — L97221 Non-pressure chronic ulcer of left calf limited to breakdown of skin: Secondary | ICD-10-CM | POA: Diagnosis not present

## 2016-05-13 DIAGNOSIS — L97211 Non-pressure chronic ulcer of right calf limited to breakdown of skin: Secondary | ICD-10-CM | POA: Diagnosis not present

## 2016-05-13 DIAGNOSIS — G309 Alzheimer's disease, unspecified: Secondary | ICD-10-CM | POA: Diagnosis not present

## 2016-05-13 DIAGNOSIS — E785 Hyperlipidemia, unspecified: Secondary | ICD-10-CM | POA: Diagnosis not present

## 2016-05-13 DIAGNOSIS — I10 Essential (primary) hypertension: Secondary | ICD-10-CM | POA: Diagnosis not present

## 2016-05-14 DIAGNOSIS — L97822 Non-pressure chronic ulcer of other part of left lower leg with fat layer exposed: Secondary | ICD-10-CM | POA: Diagnosis not present

## 2016-05-14 DIAGNOSIS — S81802A Unspecified open wound, left lower leg, initial encounter: Secondary | ICD-10-CM | POA: Diagnosis not present

## 2016-05-14 DIAGNOSIS — F0390 Unspecified dementia without behavioral disturbance: Secondary | ICD-10-CM | POA: Diagnosis not present

## 2016-05-14 DIAGNOSIS — S81811A Laceration without foreign body, right lower leg, initial encounter: Secondary | ICD-10-CM | POA: Diagnosis not present

## 2016-05-14 DIAGNOSIS — I10 Essential (primary) hypertension: Secondary | ICD-10-CM | POA: Diagnosis not present

## 2016-05-14 DIAGNOSIS — L97812 Non-pressure chronic ulcer of other part of right lower leg with fat layer exposed: Secondary | ICD-10-CM | POA: Diagnosis not present

## 2016-05-14 DIAGNOSIS — I87333 Chronic venous hypertension (idiopathic) with ulcer and inflammation of bilateral lower extremity: Secondary | ICD-10-CM | POA: Diagnosis not present

## 2016-05-14 DIAGNOSIS — L97222 Non-pressure chronic ulcer of left calf with fat layer exposed: Secondary | ICD-10-CM | POA: Diagnosis not present

## 2016-05-27 DIAGNOSIS — E7800 Pure hypercholesterolemia, unspecified: Secondary | ICD-10-CM | POA: Diagnosis not present

## 2016-05-27 DIAGNOSIS — R609 Edema, unspecified: Secondary | ICD-10-CM | POA: Diagnosis not present

## 2016-05-27 DIAGNOSIS — I10 Essential (primary) hypertension: Secondary | ICD-10-CM | POA: Diagnosis not present

## 2016-05-28 DIAGNOSIS — L97822 Non-pressure chronic ulcer of other part of left lower leg with fat layer exposed: Secondary | ICD-10-CM | POA: Diagnosis not present

## 2016-05-28 DIAGNOSIS — F0390 Unspecified dementia without behavioral disturbance: Secondary | ICD-10-CM | POA: Diagnosis not present

## 2016-05-28 DIAGNOSIS — S81801A Unspecified open wound, right lower leg, initial encounter: Secondary | ICD-10-CM | POA: Diagnosis not present

## 2016-05-28 DIAGNOSIS — L97812 Non-pressure chronic ulcer of other part of right lower leg with fat layer exposed: Secondary | ICD-10-CM | POA: Diagnosis not present

## 2016-05-28 DIAGNOSIS — L97222 Non-pressure chronic ulcer of left calf with fat layer exposed: Secondary | ICD-10-CM | POA: Diagnosis not present

## 2016-05-28 DIAGNOSIS — S81802A Unspecified open wound, left lower leg, initial encounter: Secondary | ICD-10-CM | POA: Diagnosis not present

## 2016-05-28 DIAGNOSIS — I87333 Chronic venous hypertension (idiopathic) with ulcer and inflammation of bilateral lower extremity: Secondary | ICD-10-CM | POA: Diagnosis not present

## 2016-05-28 DIAGNOSIS — I10 Essential (primary) hypertension: Secondary | ICD-10-CM | POA: Diagnosis not present

## 2016-05-28 DIAGNOSIS — S81811A Laceration without foreign body, right lower leg, initial encounter: Secondary | ICD-10-CM | POA: Diagnosis not present

## 2016-06-01 DIAGNOSIS — E039 Hypothyroidism, unspecified: Secondary | ICD-10-CM | POA: Diagnosis not present

## 2016-06-01 DIAGNOSIS — I10 Essential (primary) hypertension: Secondary | ICD-10-CM | POA: Diagnosis not present

## 2016-06-01 DIAGNOSIS — R609 Edema, unspecified: Secondary | ICD-10-CM | POA: Diagnosis not present

## 2016-06-01 DIAGNOSIS — L98499 Non-pressure chronic ulcer of skin of other sites with unspecified severity: Secondary | ICD-10-CM | POA: Diagnosis not present

## 2016-06-11 ENCOUNTER — Encounter (HOSPITAL_BASED_OUTPATIENT_CLINIC_OR_DEPARTMENT_OTHER): Payer: Medicare Other | Attending: Internal Medicine

## 2016-06-11 DIAGNOSIS — L97221 Non-pressure chronic ulcer of left calf limited to breakdown of skin: Secondary | ICD-10-CM | POA: Insufficient documentation

## 2016-06-11 DIAGNOSIS — L97821 Non-pressure chronic ulcer of other part of left lower leg limited to breakdown of skin: Secondary | ICD-10-CM | POA: Insufficient documentation

## 2016-06-11 DIAGNOSIS — I10 Essential (primary) hypertension: Secondary | ICD-10-CM | POA: Insufficient documentation

## 2016-06-11 DIAGNOSIS — I87333 Chronic venous hypertension (idiopathic) with ulcer and inflammation of bilateral lower extremity: Secondary | ICD-10-CM | POA: Insufficient documentation

## 2016-06-11 DIAGNOSIS — F0390 Unspecified dementia without behavioral disturbance: Secondary | ICD-10-CM | POA: Insufficient documentation

## 2016-06-11 DIAGNOSIS — L97522 Non-pressure chronic ulcer of other part of left foot with fat layer exposed: Secondary | ICD-10-CM | POA: Insufficient documentation

## 2016-06-11 DIAGNOSIS — G629 Polyneuropathy, unspecified: Secondary | ICD-10-CM | POA: Insufficient documentation

## 2016-06-11 DIAGNOSIS — L97822 Non-pressure chronic ulcer of other part of left lower leg with fat layer exposed: Secondary | ICD-10-CM | POA: Insufficient documentation

## 2016-06-18 ENCOUNTER — Other Ambulatory Visit: Payer: Self-pay | Admitting: Internal Medicine

## 2016-06-18 DIAGNOSIS — M7989 Other specified soft tissue disorders: Secondary | ICD-10-CM | POA: Diagnosis not present

## 2016-06-18 DIAGNOSIS — S81811A Laceration without foreign body, right lower leg, initial encounter: Secondary | ICD-10-CM | POA: Diagnosis not present

## 2016-06-18 DIAGNOSIS — M79605 Pain in left leg: Secondary | ICD-10-CM

## 2016-06-18 DIAGNOSIS — F0390 Unspecified dementia without behavioral disturbance: Secondary | ICD-10-CM | POA: Diagnosis not present

## 2016-06-18 DIAGNOSIS — I87333 Chronic venous hypertension (idiopathic) with ulcer and inflammation of bilateral lower extremity: Secondary | ICD-10-CM | POA: Diagnosis not present

## 2016-06-18 DIAGNOSIS — G629 Polyneuropathy, unspecified: Secondary | ICD-10-CM | POA: Diagnosis not present

## 2016-06-18 DIAGNOSIS — S81802A Unspecified open wound, left lower leg, initial encounter: Secondary | ICD-10-CM | POA: Diagnosis not present

## 2016-06-18 DIAGNOSIS — L97821 Non-pressure chronic ulcer of other part of left lower leg limited to breakdown of skin: Secondary | ICD-10-CM | POA: Diagnosis not present

## 2016-06-18 DIAGNOSIS — I10 Essential (primary) hypertension: Secondary | ICD-10-CM | POA: Diagnosis not present

## 2016-06-18 DIAGNOSIS — L97822 Non-pressure chronic ulcer of other part of left lower leg with fat layer exposed: Secondary | ICD-10-CM | POA: Diagnosis not present

## 2016-06-18 DIAGNOSIS — L97522 Non-pressure chronic ulcer of other part of left foot with fat layer exposed: Secondary | ICD-10-CM | POA: Diagnosis not present

## 2016-06-18 DIAGNOSIS — L97221 Non-pressure chronic ulcer of left calf limited to breakdown of skin: Secondary | ICD-10-CM | POA: Diagnosis not present

## 2016-06-19 ENCOUNTER — Ambulatory Visit (HOSPITAL_COMMUNITY)
Admission: RE | Admit: 2016-06-19 | Discharge: 2016-06-19 | Disposition: A | Discharge status: Home / Self Care | Payer: Medicare Other | Source: Ambulatory Visit | Attending: Internal Medicine | Admitting: Internal Medicine

## 2016-06-19 DIAGNOSIS — M7989 Other specified soft tissue disorders: Secondary | ICD-10-CM | POA: Diagnosis not present

## 2016-06-19 DIAGNOSIS — M79605 Pain in left leg: Secondary | ICD-10-CM | POA: Insufficient documentation

## 2016-06-19 NOTE — Progress Notes (Signed)
*  PRELIMINARY RESULTS* Vascular Ultrasound left lower extremity venous duplex has been completed.  Preliminary findings: No evidence of deep vein thrombosis in the visualized veins of the left lower extremity.  Negative for bakers cyst.  Small hypoechoic fluid collections seen bilaterally in the groin area, unknown etiology. Preliminary results called to [facility] at Dr. Janalyn Rouse office at 9:35.  Everrett Coombe 06/19/2016, 9:44 AM

## 2016-06-22 DIAGNOSIS — L97821 Non-pressure chronic ulcer of other part of left lower leg limited to breakdown of skin: Secondary | ICD-10-CM | POA: Diagnosis not present

## 2016-06-22 DIAGNOSIS — I70242 Atherosclerosis of native arteries of left leg with ulceration of calf: Secondary | ICD-10-CM | POA: Diagnosis not present

## 2016-06-22 DIAGNOSIS — L97522 Non-pressure chronic ulcer of other part of left foot with fat layer exposed: Secondary | ICD-10-CM | POA: Diagnosis not present

## 2016-06-22 DIAGNOSIS — L97222 Non-pressure chronic ulcer of left calf with fat layer exposed: Secondary | ICD-10-CM | POA: Diagnosis not present

## 2016-06-22 DIAGNOSIS — S91302A Unspecified open wound, left foot, initial encounter: Secondary | ICD-10-CM | POA: Diagnosis not present

## 2016-06-22 DIAGNOSIS — L97221 Non-pressure chronic ulcer of left calf limited to breakdown of skin: Secondary | ICD-10-CM | POA: Diagnosis not present

## 2016-06-22 DIAGNOSIS — I87333 Chronic venous hypertension (idiopathic) with ulcer and inflammation of bilateral lower extremity: Secondary | ICD-10-CM | POA: Diagnosis not present

## 2016-06-22 DIAGNOSIS — L97822 Non-pressure chronic ulcer of other part of left lower leg with fat layer exposed: Secondary | ICD-10-CM | POA: Diagnosis not present

## 2016-06-22 DIAGNOSIS — I10 Essential (primary) hypertension: Secondary | ICD-10-CM | POA: Diagnosis not present

## 2016-06-22 DIAGNOSIS — S81802A Unspecified open wound, left lower leg, initial encounter: Secondary | ICD-10-CM | POA: Diagnosis not present

## 2016-06-25 DIAGNOSIS — L97821 Non-pressure chronic ulcer of other part of left lower leg limited to breakdown of skin: Secondary | ICD-10-CM | POA: Diagnosis not present

## 2016-06-25 DIAGNOSIS — I70242 Atherosclerosis of native arteries of left leg with ulceration of calf: Secondary | ICD-10-CM | POA: Diagnosis not present

## 2016-06-25 DIAGNOSIS — I87312 Chronic venous hypertension (idiopathic) with ulcer of left lower extremity: Secondary | ICD-10-CM | POA: Diagnosis not present

## 2016-06-25 DIAGNOSIS — L97522 Non-pressure chronic ulcer of other part of left foot with fat layer exposed: Secondary | ICD-10-CM | POA: Diagnosis not present

## 2016-06-25 DIAGNOSIS — L97222 Non-pressure chronic ulcer of left calf with fat layer exposed: Secondary | ICD-10-CM | POA: Diagnosis not present

## 2016-06-25 DIAGNOSIS — L97221 Non-pressure chronic ulcer of left calf limited to breakdown of skin: Secondary | ICD-10-CM | POA: Diagnosis not present

## 2016-06-25 DIAGNOSIS — I10 Essential (primary) hypertension: Secondary | ICD-10-CM | POA: Diagnosis not present

## 2016-06-25 DIAGNOSIS — I87333 Chronic venous hypertension (idiopathic) with ulcer and inflammation of bilateral lower extremity: Secondary | ICD-10-CM | POA: Diagnosis not present

## 2016-06-25 DIAGNOSIS — L97822 Non-pressure chronic ulcer of other part of left lower leg with fat layer exposed: Secondary | ICD-10-CM | POA: Diagnosis not present

## 2016-07-02 ENCOUNTER — Encounter (HOSPITAL_BASED_OUTPATIENT_CLINIC_OR_DEPARTMENT_OTHER): Payer: Medicare Other | Attending: Internal Medicine

## 2016-07-02 DIAGNOSIS — L97211 Non-pressure chronic ulcer of right calf limited to breakdown of skin: Secondary | ICD-10-CM | POA: Insufficient documentation

## 2016-07-02 DIAGNOSIS — H409 Unspecified glaucoma: Secondary | ICD-10-CM | POA: Diagnosis not present

## 2016-07-02 DIAGNOSIS — L97221 Non-pressure chronic ulcer of left calf limited to breakdown of skin: Secondary | ICD-10-CM | POA: Insufficient documentation

## 2016-07-02 DIAGNOSIS — I87333 Chronic venous hypertension (idiopathic) with ulcer and inflammation of bilateral lower extremity: Secondary | ICD-10-CM | POA: Insufficient documentation

## 2016-07-02 DIAGNOSIS — I10 Essential (primary) hypertension: Secondary | ICD-10-CM | POA: Diagnosis not present

## 2016-07-02 DIAGNOSIS — I70242 Atherosclerosis of native arteries of left leg with ulceration of calf: Secondary | ICD-10-CM | POA: Diagnosis not present

## 2016-07-02 DIAGNOSIS — L97521 Non-pressure chronic ulcer of other part of left foot limited to breakdown of skin: Secondary | ICD-10-CM | POA: Insufficient documentation

## 2016-07-02 DIAGNOSIS — L97222 Non-pressure chronic ulcer of left calf with fat layer exposed: Secondary | ICD-10-CM | POA: Diagnosis not present

## 2016-07-02 DIAGNOSIS — F0390 Unspecified dementia without behavioral disturbance: Secondary | ICD-10-CM | POA: Insufficient documentation

## 2016-07-02 DIAGNOSIS — G629 Polyneuropathy, unspecified: Secondary | ICD-10-CM | POA: Insufficient documentation

## 2016-07-03 DIAGNOSIS — L98499 Non-pressure chronic ulcer of skin of other sites with unspecified severity: Secondary | ICD-10-CM | POA: Diagnosis not present

## 2016-07-03 DIAGNOSIS — R609 Edema, unspecified: Secondary | ICD-10-CM | POA: Diagnosis not present

## 2016-07-03 DIAGNOSIS — I10 Essential (primary) hypertension: Secondary | ICD-10-CM | POA: Diagnosis not present

## 2016-07-03 DIAGNOSIS — R011 Cardiac murmur, unspecified: Secondary | ICD-10-CM | POA: Diagnosis not present

## 2016-07-09 DIAGNOSIS — S81801A Unspecified open wound, right lower leg, initial encounter: Secondary | ICD-10-CM | POA: Diagnosis not present

## 2016-07-09 DIAGNOSIS — L97222 Non-pressure chronic ulcer of left calf with fat layer exposed: Secondary | ICD-10-CM | POA: Diagnosis not present

## 2016-07-09 DIAGNOSIS — I70242 Atherosclerosis of native arteries of left leg with ulceration of calf: Secondary | ICD-10-CM | POA: Diagnosis not present

## 2016-07-09 DIAGNOSIS — L97221 Non-pressure chronic ulcer of left calf limited to breakdown of skin: Secondary | ICD-10-CM | POA: Diagnosis not present

## 2016-07-09 DIAGNOSIS — I10 Essential (primary) hypertension: Secondary | ICD-10-CM | POA: Diagnosis not present

## 2016-07-09 DIAGNOSIS — I87333 Chronic venous hypertension (idiopathic) with ulcer and inflammation of bilateral lower extremity: Secondary | ICD-10-CM | POA: Diagnosis not present

## 2016-07-09 DIAGNOSIS — F0390 Unspecified dementia without behavioral disturbance: Secondary | ICD-10-CM | POA: Diagnosis not present

## 2016-07-09 DIAGNOSIS — L97211 Non-pressure chronic ulcer of right calf limited to breakdown of skin: Secondary | ICD-10-CM | POA: Diagnosis not present

## 2016-07-09 DIAGNOSIS — L97521 Non-pressure chronic ulcer of other part of left foot limited to breakdown of skin: Secondary | ICD-10-CM | POA: Diagnosis not present

## 2016-07-14 ENCOUNTER — Encounter (HOSPITAL_COMMUNITY): Payer: Self-pay | Admitting: Emergency Medicine

## 2016-07-14 ENCOUNTER — Emergency Department (HOSPITAL_COMMUNITY): Payer: Medicare Other

## 2016-07-14 ENCOUNTER — Inpatient Hospital Stay (HOSPITAL_COMMUNITY)
Admission: EM | Admit: 2016-07-14 | Discharge: 2016-07-17 | DRG: 392 | Disposition: A | Discharge status: Skilled Nursing Facility | Payer: Medicare Other | Attending: Internal Medicine | Admitting: Internal Medicine

## 2016-07-14 DIAGNOSIS — M4854XA Collapsed vertebra, not elsewhere classified, thoracic region, initial encounter for fracture: Secondary | ICD-10-CM | POA: Diagnosis present

## 2016-07-14 DIAGNOSIS — E876 Hypokalemia: Secondary | ICD-10-CM | POA: Diagnosis not present

## 2016-07-14 DIAGNOSIS — E039 Hypothyroidism, unspecified: Secondary | ICD-10-CM | POA: Diagnosis not present

## 2016-07-14 DIAGNOSIS — Z886 Allergy status to analgesic agent status: Secondary | ICD-10-CM

## 2016-07-14 DIAGNOSIS — C649 Malignant neoplasm of unspecified kidney, except renal pelvis: Secondary | ICD-10-CM | POA: Diagnosis not present

## 2016-07-14 DIAGNOSIS — I10 Essential (primary) hypertension: Secondary | ICD-10-CM | POA: Diagnosis not present

## 2016-07-14 DIAGNOSIS — F0391 Unspecified dementia with behavioral disturbance: Secondary | ICD-10-CM | POA: Diagnosis not present

## 2016-07-14 DIAGNOSIS — N179 Acute kidney failure, unspecified: Secondary | ICD-10-CM | POA: Diagnosis not present

## 2016-07-14 DIAGNOSIS — I959 Hypotension, unspecified: Secondary | ICD-10-CM | POA: Diagnosis present

## 2016-07-14 DIAGNOSIS — S22009D Unspecified fracture of unspecified thoracic vertebra, subsequent encounter for fracture with routine healing: Secondary | ICD-10-CM | POA: Diagnosis not present

## 2016-07-14 DIAGNOSIS — H3530 Unspecified macular degeneration: Secondary | ICD-10-CM | POA: Diagnosis present

## 2016-07-14 DIAGNOSIS — M4856XA Collapsed vertebra, not elsewhere classified, lumbar region, initial encounter for fracture: Secondary | ICD-10-CM | POA: Diagnosis present

## 2016-07-14 DIAGNOSIS — Z79899 Other long term (current) drug therapy: Secondary | ICD-10-CM | POA: Diagnosis not present

## 2016-07-14 DIAGNOSIS — E860 Dehydration: Secondary | ICD-10-CM | POA: Diagnosis not present

## 2016-07-14 DIAGNOSIS — Z7952 Long term (current) use of systemic steroids: Secondary | ICD-10-CM | POA: Diagnosis not present

## 2016-07-14 DIAGNOSIS — F03918 Unspecified dementia, unspecified severity, with other behavioral disturbance: Secondary | ICD-10-CM | POA: Diagnosis present

## 2016-07-14 DIAGNOSIS — N2889 Other specified disorders of kidney and ureter: Secondary | ICD-10-CM | POA: Diagnosis present

## 2016-07-14 DIAGNOSIS — H409 Unspecified glaucoma: Secondary | ICD-10-CM | POA: Diagnosis present

## 2016-07-14 DIAGNOSIS — R739 Hyperglycemia, unspecified: Secondary | ICD-10-CM | POA: Diagnosis not present

## 2016-07-14 DIAGNOSIS — G629 Polyneuropathy, unspecified: Secondary | ICD-10-CM | POA: Diagnosis present

## 2016-07-14 DIAGNOSIS — R1900 Intra-abdominal and pelvic swelling, mass and lump, unspecified site: Secondary | ICD-10-CM | POA: Diagnosis not present

## 2016-07-14 DIAGNOSIS — A09 Infectious gastroenteritis and colitis, unspecified: Principal | ICD-10-CM | POA: Diagnosis present

## 2016-07-14 DIAGNOSIS — R4182 Altered mental status, unspecified: Secondary | ICD-10-CM | POA: Diagnosis not present

## 2016-07-14 DIAGNOSIS — F05 Delirium due to known physiological condition: Secondary | ICD-10-CM | POA: Diagnosis present

## 2016-07-14 DIAGNOSIS — M6281 Muscle weakness (generalized): Secondary | ICD-10-CM | POA: Diagnosis not present

## 2016-07-14 DIAGNOSIS — Z66 Do not resuscitate: Secondary | ICD-10-CM | POA: Diagnosis present

## 2016-07-14 DIAGNOSIS — I872 Venous insufficiency (chronic) (peripheral): Secondary | ICD-10-CM | POA: Diagnosis not present

## 2016-07-14 DIAGNOSIS — K529 Noninfective gastroenteritis and colitis, unspecified: Secondary | ICD-10-CM

## 2016-07-14 DIAGNOSIS — R278 Other lack of coordination: Secondary | ICD-10-CM | POA: Diagnosis not present

## 2016-07-14 DIAGNOSIS — Z888 Allergy status to other drugs, medicaments and biological substances status: Secondary | ICD-10-CM | POA: Diagnosis not present

## 2016-07-14 DIAGNOSIS — R2689 Other abnormalities of gait and mobility: Secondary | ICD-10-CM | POA: Diagnosis not present

## 2016-07-14 DIAGNOSIS — R1312 Dysphagia, oropharyngeal phase: Secondary | ICD-10-CM | POA: Diagnosis not present

## 2016-07-14 DIAGNOSIS — E785 Hyperlipidemia, unspecified: Secondary | ICD-10-CM | POA: Diagnosis present

## 2016-07-14 DIAGNOSIS — R109 Unspecified abdominal pain: Secondary | ICD-10-CM | POA: Diagnosis not present

## 2016-07-14 DIAGNOSIS — B999 Unspecified infectious disease: Secondary | ICD-10-CM | POA: Diagnosis not present

## 2016-07-14 HISTORY — DX: Unspecified glaucoma: H40.9

## 2016-07-14 HISTORY — DX: Unspecified dementia, unspecified severity, without behavioral disturbance, psychotic disturbance, mood disturbance, and anxiety: F03.90

## 2016-07-14 HISTORY — DX: Venous insufficiency (chronic) (peripheral): I87.2

## 2016-07-14 HISTORY — DX: Unspecified macular degeneration: H35.30

## 2016-07-14 LAB — CBC
HCT: 37.7 % (ref 36.0–46.0)
Hemoglobin: 12.4 g/dL (ref 12.0–15.0)
MCH: 32.3 pg (ref 26.0–34.0)
MCHC: 32.9 g/dL (ref 30.0–36.0)
MCV: 98.2 fL (ref 78.0–100.0)
PLATELETS: 251 10*3/uL (ref 150–400)
RBC: 3.84 MIL/uL — AB (ref 3.87–5.11)
RDW: 14.4 % (ref 11.5–15.5)
WBC: 15.4 10*3/uL — AB (ref 4.0–10.5)

## 2016-07-14 LAB — COMPREHENSIVE METABOLIC PANEL
ALT: 17 U/L (ref 14–54)
AST: 26 U/L (ref 15–41)
Albumin: 3.8 g/dL (ref 3.5–5.0)
Alkaline Phosphatase: 170 U/L — ABNORMAL HIGH (ref 38–126)
Anion gap: 10 (ref 5–15)
BILIRUBIN TOTAL: 0.8 mg/dL (ref 0.3–1.2)
BUN: 29 mg/dL — ABNORMAL HIGH (ref 6–20)
CALCIUM: 9.7 mg/dL (ref 8.9–10.3)
CHLORIDE: 101 mmol/L (ref 101–111)
CO2: 26 mmol/L (ref 22–32)
CREATININE: 1.33 mg/dL — AB (ref 0.44–1.00)
GFR, EST AFRICAN AMERICAN: 39 mL/min — AB (ref >60)
GFR, EST NON AFRICAN AMERICAN: 33 mL/min — AB (ref >60)
Glucose, Bld: 154 mg/dL — ABNORMAL HIGH (ref 65–99)
Potassium: 4.1 mmol/L (ref 3.5–5.1)
Sodium: 137 mmol/L (ref 135–145)
Total Protein: 7.6 g/dL (ref 6.5–8.1)

## 2016-07-14 LAB — LIPASE, BLOOD: LIPASE: 57 U/L — AB (ref 11–51)

## 2016-07-14 LAB — LACTIC ACID, PLASMA: Lactic Acid, Venous: 1 mmol/L (ref 0.5–1.9)

## 2016-07-14 MED ORDER — SODIUM CHLORIDE 0.9 % IV SOLN
INTRAVENOUS | Status: DC
  Administered 2016-07-14 – 2016-07-15 (×2): via INTRAVENOUS
  Administered 2016-07-16: 1000 mL via INTRAVENOUS

## 2016-07-14 MED ORDER — ONDANSETRON HCL 4 MG/2ML IJ SOLN: 4.0000 mg | Freq: Four times a day (QID) | INTRAMUSCULAR | Status: DC | PRN

## 2016-07-14 MED ORDER — METRONIDAZOLE IN NACL 5-0.79 MG/ML-% IV SOLN
500.0000 mg | Freq: Once | INTRAVENOUS | Status: AC
  Administered 2016-07-14: 500 mg via INTRAVENOUS
  Filled 2016-07-14: qty 100

## 2016-07-14 MED ORDER — DORZOLAMIDE HCL-TIMOLOL MAL 2-0.5 % OP SOLN
1.0000 [drp] | Freq: Two times a day (BID) | OPHTHALMIC | Status: DC
  Administered 2016-07-15 – 2016-07-17 (×5): 1 [drp] via OPHTHALMIC
  Filled 2016-07-14: qty 10

## 2016-07-14 MED ORDER — PRAVASTATIN SODIUM 20 MG PO TABS
10.0000 mg | ORAL_TABLET | Freq: Every day | ORAL | Status: DC
  Administered 2016-07-15 – 2016-07-16 (×2): 10 mg via ORAL
  Filled 2016-07-14 (×2): qty 1

## 2016-07-14 MED ORDER — MORPHINE SULFATE (PF) 4 MG/ML IV SOLN: 1.0000 mg | INTRAVENOUS | Status: DC | PRN

## 2016-07-14 MED ORDER — LATANOPROST 0.005 % OP SOLN
1.0000 [drp] | Freq: Every day | OPHTHALMIC | Status: DC
  Administered 2016-07-14 – 2016-07-16 (×3): 1 [drp] via OPHTHALMIC
  Filled 2016-07-14: qty 2.5

## 2016-07-14 MED ORDER — PREDNISONE 5 MG PO TABS
5.0000 mg | ORAL_TABLET | Freq: Every day | ORAL | Status: DC
  Administered 2016-07-15 – 2016-07-17 (×3): 5 mg via ORAL
  Filled 2016-07-14 (×3): qty 1

## 2016-07-14 MED ORDER — FLUVOXAMINE MALEATE 50 MG PO TABS
25.0000 mg | ORAL_TABLET | Freq: Every day | ORAL | Status: DC
  Administered 2016-07-15 – 2016-07-16 (×2): 25 mg via ORAL
  Filled 2016-07-14 (×3): qty 1

## 2016-07-14 MED ORDER — LORAZEPAM 2 MG/ML IJ SOLN: 0.5000 mg | INTRAMUSCULAR | Status: DC | PRN

## 2016-07-14 MED ORDER — CIPROFLOXACIN IN D5W 400 MG/200ML IV SOLN
400.0000 mg | Freq: Once | INTRAVENOUS | Status: AC
  Administered 2016-07-14: 400 mg via INTRAVENOUS
  Filled 2016-07-14: qty 200

## 2016-07-14 MED ORDER — FLUVOXAMINE MALEATE 50 MG PO TABS: 25.0000 mg | ORAL_TABLET | Freq: Every day | ORAL | Status: DC

## 2016-07-14 MED ORDER — PILOCARPINE HCL 4 % OP SOLN
1.0000 [drp] | Freq: Four times a day (QID) | OPHTHALMIC | Status: DC
  Administered 2016-07-14 – 2016-07-17 (×11): 1 [drp] via OPHTHALMIC
  Filled 2016-07-14: qty 15

## 2016-07-14 MED ORDER — ENOXAPARIN SODIUM 30 MG/0.3ML ~~LOC~~ SOLN
20.0000 mg | SUBCUTANEOUS | Status: DC
  Administered 2016-07-14 – 2016-07-16 (×3): 20 mg via SUBCUTANEOUS
  Filled 2016-07-14: qty 0.3
  Filled 2016-07-14 (×3): qty 0.2
  Filled 2016-07-14: qty 0.3
  Filled 2016-07-14: qty 0.2

## 2016-07-14 MED ORDER — SODIUM CHLORIDE 0.9 % IV BOLUS (SEPSIS)
1000.0000 mL | Freq: Once | INTRAVENOUS | Status: AC
  Administered 2016-07-14: 1000 mL via INTRAVENOUS

## 2016-07-14 MED ORDER — IOPAMIDOL (ISOVUE-300) INJECTION 61%
INTRAVENOUS | Status: AC
  Filled 2016-07-14: qty 75

## 2016-07-14 MED ORDER — IOPAMIDOL (ISOVUE-300) INJECTION 61%
75.0000 mL | Freq: Once | INTRAVENOUS | Status: AC | PRN
  Administered 2016-07-14: 75 mL via INTRAVENOUS

## 2016-07-14 MED ORDER — ONDANSETRON HCL 4 MG PO TABS: 4.0000 mg | ORAL_TABLET | Freq: Four times a day (QID) | ORAL | Status: DC | PRN

## 2016-07-14 MED ORDER — ACETAMINOPHEN 650 MG RE SUPP
650.0000 mg | Freq: Four times a day (QID) | RECTAL | Status: DC | PRN
  Filled 2016-07-14: qty 1

## 2016-07-14 MED ORDER — BRIMONIDINE TARTRATE 0.2 % OP SOLN
1.0000 [drp] | Freq: Three times a day (TID) | OPHTHALMIC | Status: DC
  Administered 2016-07-15 – 2016-07-17 (×7): 1 [drp] via OPHTHALMIC
  Filled 2016-07-14: qty 5

## 2016-07-14 MED ORDER — ACETAMINOPHEN 325 MG PO TABS
650.0000 mg | ORAL_TABLET | Freq: Four times a day (QID) | ORAL | Status: DC | PRN
  Administered 2016-07-15 – 2016-07-16 (×2): 650 mg via ORAL
  Filled 2016-07-14 (×2): qty 2

## 2016-07-14 NOTE — ED Notes (Signed)
Delay on pt vitals pt went to CT

## 2016-07-14 NOTE — ED Notes (Signed)
Bed: AI90 Expected date:  Expected time:  Means of arrival:  Comments: 83 yo f, left sided pain, emesis

## 2016-07-14 NOTE — ED Notes (Signed)
Pt unable to provide urine at this time.  Will wait for IV fluids to finish and will try again.

## 2016-07-14 NOTE — ED Notes (Signed)
Waiting on fluids to finish before we take pt to restroom for urine sample.  MD aware.

## 2016-07-14 NOTE — ED Provider Notes (Signed)
[place] DEPT Provider Note   CSN: [id number] Arrival date & time: 07/14/16  1139     History   Chief Complaint Chief Complaint  Patient presents with  . Abdominal Pain  . Weakness    Level 5 Caveat  HPI Joanne Anderson is a 83 y.o. female with PMHx of HLD, HTN, dementia brought to ED by EMS Presents today with complaints of left lower quadrant pain starting this morning. She reports associated nausea and vomiting.   Per grandson: Patient began to have this pain has been gradually worsening since this morning. Her nausea and vomiting seems to be improving. She has not been given anything for her pain. He denies her complaining of any chest pain, shortness of breath. He reports decreased appetite. He reports her having a bowel movement on the bed that was loose with no blood. He states he got in contact with her primary care provider regarding her symptoms and was told to come to ED. He was concerned about dehydration. He states that patient has told him that one has been excised in the past a few inches but a does not know if she has had a history of diverticulitis or any other colonic pathology history. He states he took her temperature at home and patient did not have any fevers. He reports that patient follows up at wound care clinic for bilateral lower leg wounds which were recently treated with a new type of wrap 5 days ago. He states she has not been complaining about her legs. She lives alone and has help from grandson.   The history is provided by the patient and a relative (Grandson). The history is limited by the condition of the patient and a developmental delay. No language interpreter was used.    Past Medical History:  Diagnosis Date  . Hyperlipidemia   . Hypertension   . Peripheral neuropathy     Patient Active Problem List   Diagnosis Date Noted  . 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 No pertinent surgical history.  OB History      No data available       Home Medications    Prior to Admission medications   Medication Sig Start Date End Date Taking? Authorizing Provider  acetaminophen (TYLENOL) 500 MG tablet Take 500 mg by mouth every 6 (six) hours as needed for mild pain, moderate pain, fever or headache.   Yes [provider]  amLODipine (NORVASC) 2.5 MG tablet Take 2.5 mg by mouth every Monday, Wednesday, and Friday.   Yes [provider]  brimonidine (ALPHAGAN) 0.2 % ophthalmic solution Place 1 drop into both eyes 3 (three) times daily.    Yes [provider]  dorzolamide-timolol (COSOPT) 22.3-6.8 MG/ML ophthalmic solution Place 1 drop into both eyes 2 (two) times daily.   Yes [provider]  fluvoxaMINE (LUVOX) 25 MG tablet Take 25 mg by mouth at bedtime.   Yes [provider]  furosemide (LASIX) 20 MG tablet Take 20-40 mg by mouth daily. Pt takes two tablets on Monday, Wednesday, and Friday.   Pt takes one tablet on Sunday, Tuesday, Thursday, and Saturday.   Yes [provider]  latanoprost (XALATAN) 0.005 % ophthalmic solution Place 1 drop into both eyes at bedtime.    Yes [provider]  levothyroxine (SYNTHROID, LEVOTHROID) 25 MCG tablet Take 12.5 mcg by mouth every Saturday.    Yes [provider]  lovastatin (MEVACOR) 10 MG tablet Take 5 mg  by mouth every evening.    Yes [provider]  Melatonin 10 MG TABS Take 5 mg by mouth at bedtime.   Yes [provider]  nebivolol (BYSTOLIC) 10 MG tablet Take 2.5 mg by mouth every evening.   Yes [provider]  pilocarpine (PILOCAR) 4 % ophthalmic solution Place 1 drop into the left eye 4 (four) times daily.    Yes [provider]  predniSONE (DELTASONE) 5 MG tablet Take 5 mg by mouth daily with breakfast.    Yes [provider]  ramipril (ALTACE) 5 MG capsule Take 5 mg by mouth every evening.   Yes [provider]    Family History No  family history on file.  Social History Social History  Substance Use Topics  . Smoking status: Never Smoker  . Smokeless tobacco: Never Used  . Alcohol use No     Allergies   Codeine; Nsaids; Gabapentin; and Other   Review of Systems Review of Systems  Unable to perform ROS: Dementia  Constitutional: Negative for chills and fever.  Respiratory: Negative for shortness of breath.   Cardiovascular: Negative for chest pain.  Gastrointestinal: Positive for abdominal pain (LLQ), diarrhea, nausea and vomiting. Negative for blood in stool.     Physical Exam Updated Vital Signs BP 102/62 (BP Location: Left Arm)   Pulse 70   Temp 97.8 F (36.6 C) (Oral)   Resp 16   Ht 5' (1.524 m)   Wt 36.3 kg   SpO2 100%   BMI 15.62 kg/m   Physical Exam  Constitutional: She appears well-developed and well-nourished.  Well appearing  HENT:  Head: Normocephalic and atraumatic.  Nose: Nose normal.  Eyes: Conjunctivae and EOM are normal.  Neck: Normal range of motion.  Cardiovascular: Normal rate, normal heart sounds and intact distal pulses.   No murmur heard. Pulmonary/Chest: Effort normal and breath sounds normal. No respiratory distress. She has no wheezes. She has no rales.  Normal work of breathing. No respiratory distress noted.   Abdominal: Soft. There is tenderness. There is guarding. There is no rebound.  Soft. Moderate tenderness to left lower quadrant with mild guarding. No rebound tenderness noted. Negative Murphy sign. No focal tenderness at McBurney's point. No CVA tenderness  Musculoskeletal: Normal range of motion.  Neurological: She is alert.  Skin: Skin is warm. Capillary refill takes less than 2 seconds.  Psychiatric: She has a normal mood and affect. Her behavior is normal.  Nursing note and vitals reviewed.    ED Treatments / Results  Labs (all labs ordered are listed, but only abnormal results are displayed) Labs Reviewed  LIPASE, BLOOD - Abnormal; Notable  for the following:       Result Value   Lipase 57 (*)    All other components within normal limits  COMPREHENSIVE METABOLIC PANEL - Abnormal; Notable for the following:    Glucose, Bld 154 (*)    BUN 29 (*)    Creatinine, Ser 1.33 (*)    Alkaline Phosphatase 170 (*)    GFR calc non Af Amer 33 (*)    GFR calc Af Amer 39 (*)    All other components within normal limits  CBC - Abnormal; Notable for the following:    WBC 15.4 (*)    RBC 3.84 (*)    All other components within normal limits  URINALYSIS, ROUTINE W REFLEX MICROSCOPIC    EKG  EKG Interpretation None       Radiology Ct Abdomen Pelvis  W Contrast  Result Date: 07/14/2016 CLINICAL DATA:  Left lower quadrant pain EXAM: CT ABDOMEN AND PELVIS WITH CONTRAST TECHNIQUE: Multidetector CT imaging of the abdomen and pelvis was performed using the standard protocol following bolus administration of intravenous contrast. CONTRAST:  22mL ISOVUE-300 IOPAMIDOL (ISOVUE-300) INJECTION 61% COMPARISON:  01/22/2012 FINDINGS: Lower chest: Sliding hiatal hernia, moderate size. Mild atelectasis at the bases. Hepatobiliary: No focal liver abnormality.No evidence of biliary obstruction or stone. Pancreas: Unremarkable. Spleen: Unremarkable. Adrenals/Urinary Tract: Negative adrenals. Solid left renal mass, interpolar and extending into the hilum, 26 x 34 mm, slightly larger than in 2013. No adenopathy or vascular invasive feature. Unremarkable bladder. Stomach/Bowel: There is a segment of colonic wall thickening from submucosal edema, mainly from the distal transverse to sigmoid colon. The colon is diffusely fluid-filled. Colonic diverticulosis without active inflammation. Abrupt transition from decompressed thickened colon to distended sigmoid colon, without discrete mass lesion noted. There is extensive atherosclerosis of the aorta and branch vessels. Quantification of ostial stenosis is not possible on this study. No major branch occlusion is noted. The  IMA is patent. Negative for small bowel obstruction. Vascular/Lymphatic: Diffuse atherosclerotic calcification as noted above. No acute vascular finding. No mass or adenopathy. Reproductive:Hysterectomy.  Negative adnexae. Other: Left inguinal hernia containing nonobstructed or inflamed small bowel. There is a right groin hernia containing small bowel, possibly a postoperative hernia given neighboring clips. Additional hernia in the right groin containing ascitic density material. Hernias are progressed from prior. Musculoskeletal: Compression fractures of T8, T9, T10, T12, L1, L5. The lumbar spine was seen previously from L5-T11, and only the L5 fracture was present at that time. Height loss is advanced at although fractures except at T9, where there is a visible fracture plane and sclerosis, and L5. No retropulsion. Exaggerated thoracolumbar curvature. IMPRESSION: 1. Colitis centered on the splenic flexure; infectious or ischemic colitis could give this appearance. There is extensive atherosclerosis without major branch occlusion. 2. Known solid left renal mass/neoplasm with minimal growth since 2013, 26 x 34 mm today. 3. Compression fractures of T8, T9, T10, T12, L1, and L5. Height loss is advanced at multiple levels. The T9 fracture appears subacute and unhealed. 4. Bilateral groin hernias containing nonobstructed small bowel. Electronically Signed   By: Monte Fantasia M.D.   On: 07/14/2016 15:23    Procedures Procedures (including critical care time)  Medications Ordered in ED Medications  sodium chloride 0.9 % bolus 1,000 mL (not administered)  iopamidol (ISOVUE-300) 61 % injection (not administered)  iopamidol (ISOVUE-300) 61 % injection 75 mL (75 mLs Intravenous Contrast Given 07/14/16 1455)     Initial Impression / Assessment and Plan / ED Course  I have reviewed the triage vital signs and the nursing notes.  Pertinent labs & imaging results that were available during my care of the patient  were reviewed by me and considered in my medical decision making (see chart for details).     Patient with apparent colitis centered on the splenic flexure, infectious or ischemic colitis per CT. Lactic acid was negative. Likely infectious in nature. Colonic diverticulosis without active inflammation. Patient is in no apparent distress, afebrile, hemodynamically stable. Heart and lung sounds are clear. Abdomen was tender to left lower quadrant with some guarding. Patient's white blood cells are increased to 15. Patient also lives at home alone and has history of dementia. I feel admission is best for her. She is started on a liter of fluids, cipro and fluids. Patient, her grandson, and patient's niece even up-to-date and  results and recommendations for her admission. They verbally understood and in agreement.   Pt also seen and evaluated by Dr. Eulis Foster who agreed with assessment and plan.   Dr. Lorin Mercy agreed to admit patient for further treatment and evaluation.    Final Clinical Impressions(s) / ED Diagnoses   Final diagnoses:  Colitis    New Prescriptions New Prescriptions   No medications on file     Bettey Costa, [place] 07/14/16 2058    Daleen Bo, MD 07/15/16 [phone]

## 2016-07-14 NOTE — ED Notes (Signed)
RN sts there is no need for repeat lactic, previous result WNL and pt currently transporting to floor.

## 2016-07-14 NOTE — ED Provider Notes (Signed)
  Face-to-face evaluation   History: She presents for evaluation of left-sided pain associated with nausea, decreased appetite, vomiting and diarrhea.  She was transferred by EMS for evaluation.  She follows up at the wound care clinic for bilateral lower leg wounds, which were recently treated with a new type of wrap, last done about 5 days ago.  She has not been complaining about her legs.  She lives alone but has a grandson who helps her every day for about 4 hours.  No known sick contacts.  Physical exam: Alert elderly female.  Heart regular rate and rhythm no murmur lungs clear anteriorly.  Abdomen somewhat distended soft, mild diffuse tenderness and guarding.  Medical screening examination/treatment/procedure(s) were conducted as a shared visit with non-physician practitioner(s) and myself.  I personally evaluated the patient during the encounter   Daleen Bo, MD 07/15/16 [phone]

## 2016-07-14 NOTE — ED Notes (Signed)
Pt attempted to get a urine sample but was unable to do so

## 2016-07-14 NOTE — ED Triage Notes (Signed)
Patient is from home.  Patient started complaining of abdominal pain on left side since am.  Patient is complaining of N/V.    Patient has wounds on bilateral legs but they are being taken care of by wound MD at [facility]   BP: 100/60 P:60 R: 16 Room air

## 2016-07-14 NOTE — ED Notes (Signed)
Patient transported to CT 

## 2016-07-14 NOTE — H&P (Addendum)
History and Physical    [name]:[id number] DOB: [birth year] DOA: 07/14/2016  PCP: Jani Gravel, MD Consultants:  [place]; [facility] - ophthalmology Patient coming from: home - lives alone (grandson goes over daily); NOK: grandson, [phone]  Chief Complaint: abdominal pain  HPI: Joanne Anderson is a 83 y.o. female with medical history significant of stasis dermatitis of the legs; glaucoma and macular degeneration; HTN; HLD; and mild dementia presenting with abdominal pain.  She reports, "Well honey, I don't think I am gonna make it.  I can't make it through the night, who can come to bring me a drink of water?"  Other history was obtained via the niece and grandson.  The patient had a scheduled doctor's appointment today.  When the grandson got there, she was dressed and ready but complained that she needed to go to the bathroom.  She was weak when she came out and started vomiting and vomited several times in a row.  He called her doctor's office and they suggested bringing her in to the ER.  Since here, very loose BMs x 2-3 in the ER.  No further emesis.  LLQ abdominal pain.  Also with LE edema, legs are wrapped, "heavy" and she reports that she can't lift them.   ED Course: 1L IVF bolus, CT with colitis, given Cipro/Flagyl  Review of Systems: As per HPI; otherwise review of systems reviewed and negative. This is limited by her dementia.  Ambulatory Status:  Ambulates with a cane  Past Medical History:  Diagnosis Date  . Dementia   . Glaucoma   . Hyperlipidemia   . Hypertension   . Macular degeneration   . Peripheral neuropathy   . Stasis dermatitis of both legs    goes to the [facility]    Past Surgical History:  Procedure Laterality Date  . CHOLECYSTECTOMY    . EYE SURGERY    . HEMICOLECTOMY     remote    Social History   Social History  . Marital status: Widowed    Spouse name: N/A  . Number of children: N/A  . Years of education: N/A    Occupational History  . retired    Social History Main Topics  . Smoking status: Never Smoker  . Smokeless tobacco: Never Used  . Alcohol use No  . Drug use: No  . Sexual activity: Not on file   Other Topics Concern  . Not on file   Social History Narrative  . No narrative on file    Allergies  Allergen Reactions  . Codeine Anaphylaxis  . Nsaids Nausea And Vomiting  . Gabapentin Nausea And Vomiting  . Other Nausea And Vomiting and Other (See Comments)    Pt states that she is allergic to all pain medications.     History reviewed. No pertinent family history.  Prior to Admission medications   Medication Sig Start Date End Date Taking? Authorizing Provider  acetaminophen (TYLENOL) 500 MG tablet Take 500 mg by mouth every 6 (six) hours as needed for mild pain, moderate pain, fever or headache.   Yes [provider]  amLODipine (NORVASC) 2.5 MG tablet Take 2.5 mg by mouth every Monday, Wednesday, and Friday.   Yes [provider]  brimonidine (ALPHAGAN) 0.2 % ophthalmic solution Place 1 drop into both eyes 3 (three) times daily.    Yes [provider]  dorzolamide-timolol (COSOPT) 22.3-6.8 MG/ML ophthalmic solution Place 1 drop into both eyes 2 (two) times daily.  Yes [provider]  fluvoxaMINE (LUVOX) 25 MG tablet Take 25 mg by mouth at bedtime.   Yes [provider]  furosemide (LASIX) 20 MG tablet Take 20-40 mg by mouth daily. Pt takes two tablets on Monday, Wednesday, and Friday.   Pt takes one tablet on Sunday, Tuesday, Thursday, and Saturday.   Yes [provider]  latanoprost (XALATAN) 0.005 % ophthalmic solution Place 1 drop into both eyes at bedtime.    Yes [provider]  levothyroxine (SYNTHROID, LEVOTHROID) 25 MCG tablet Take 12.5 mcg by mouth every Saturday.    Yes [provider]  lovastatin (MEVACOR) 10 MG tablet Take 5 mg by mouth every evening.    Yes [provider]   Melatonin 10 MG TABS Take 5 mg by mouth at bedtime.   Yes [provider]  nebivolol (BYSTOLIC) 10 MG tablet Take 2.5 mg by mouth every evening.   Yes [provider]  pilocarpine (PILOCAR) 4 % ophthalmic solution Place 1 drop into the left eye 4 (four) times daily.    Yes [provider]  predniSONE (DELTASONE) 5 MG tablet Take 5 mg by mouth daily with breakfast.    Yes [provider]  ramipril (ALTACE) 5 MG capsule Take 5 mg by mouth every evening.   Yes [provider]    Physical Exam: Vitals:   07/14/16 1800 07/14/16 1830 07/14/16 1900 07/14/16 2008  BP: (!) 125/59 (!) 119/55 115/67 119/74  Pulse: 82 84 84 74  Resp:   16 16  Temp:      TempSrc:      SpO2: 95% 95% 95% 97%  Weight:      Height:         General: Appears calm and comfortable and is NAD; she continued to ask for water throughout the entire time I was in the room Eyes:  PERRL, EOMI, normal lids, iris ENT:  grossly normal hearing, lips & tongue, mmm Neck:  no LAD, masses or thyromegaly Cardiovascular:  RRR, no m/r/g. No LE edema.  Respiratory:  CTA bilaterally, no w/r/r. Normal respiratory effort. Abdomen:  soft, diffusely tender but worst in the nd, NABS Skin:  no rash or induration seen on limited exam.  Her LE are wrapped in coban wrap (LLE) and gauze wrap (RLE) from chronic stasis dermatitis Musculoskeletal:  grossly normal tone BUE/BLE, good ROM, no bony abnormality Psychiatric:  grossly normal mood and affect, speech fluent and appropriate, AOx3 Neurologic:  CN 2-12 grossly intact, moves all extremities in coordinated fashion, sensation intact  Labs on Admission: I have personally reviewed following labs and imaging studies  CBC:  Recent Labs Lab 07/14/16 1208  WBC 15.4*  HGB 12.4  HCT 37.7  MCV 98.2  PLT 950   Basic Metabolic Panel:  Recent Labs Lab 07/14/16 1208  NA 137  K 4.1  CL 101  CO2 26  GLUCOSE 154*  BUN 29*  CREATININE 1.33*   CALCIUM 9.7   GFR: Estimated Creatinine Clearance: 15.1 mL/min (A) (by C-G formula based on SCr of 1.33 mg/dL (H)). Liver Function Tests:  Recent Labs Lab 07/14/16 1208  AST 26  ALT 17  ALKPHOS 170*  BILITOT 0.8  PROT 7.6  ALBUMIN 3.8    Recent Labs Lab 07/14/16 1208  LIPASE 57*   No results for input(s): AMMONIA in the last 168 hours. Coagulation Profile: No results for input(s): INR, PROTIME in the last 168 hours. Cardiac Enzymes: No results for input(s): CKTOTAL, CKMB, CKMBINDEX,  TROPONINI in the last 168 hours. BNP (last 3 results) No results for input(s): PROBNP in the last 8760 hours. HbA1C: No results for input(s): HGBA1C in the last 72 hours. CBG: No results for input(s): GLUCAP in the last 168 hours. Lipid Profile: No results for input(s): CHOL, HDL, LDLCALC, TRIG, CHOLHDL, LDLDIRECT in the last 72 hours. Thyroid Function Tests: No results for input(s): TSH, T4TOTAL, FREET4, T3FREE, THYROIDAB in the last 72 hours. Anemia Panel: No results for input(s): VITAMINB12, FOLATE, FERRITIN, TIBC, IRON, RETICCTPCT in the last 72 hours. Urine analysis:    Component Value Date/Time   COLORURINE YELLOW 09/02/2015 1600   APPEARANCEUR CLEAR 09/02/2015 1600   LABSPEC 1.009 09/02/2015 1600   PHURINE 5.5 09/02/2015 1600   GLUCOSEU NEGATIVE 09/02/2015 1600   HGBUR NEGATIVE 09/02/2015 1600   BILIRUBINUR NEGATIVE 09/02/2015 1600   KETONESUR NEGATIVE 09/02/2015 1600   PROTEINUR NEGATIVE 09/02/2015 1600   UROBILINOGEN 0.2 07/29/2007 2341   NITRITE NEGATIVE 09/02/2015 1600   LEUKOCYTESUR NEGATIVE 09/02/2015 1600    Creatinine Clearance: Estimated Creatinine Clearance: 15.1 mL/min (A) (by C-G formula based on SCr of 1.33 mg/dL (H)).  Sepsis Labs: @LABRCNTIP (procalcitonin:4,lacticidven:4) )No results found for this or any previous visit (from the past 240 hour(s)).   Radiological Exams on Admission: Ct Abdomen Pelvis W Contrast  Result Date: 07/14/2016 CLINICAL  DATA:  Left lower quadrant pain EXAM: CT ABDOMEN AND PELVIS WITH CONTRAST TECHNIQUE: Multidetector CT imaging of the abdomen and pelvis was performed using the standard protocol following bolus administration of intravenous contrast. CONTRAST:  25mL ISOVUE-300 IOPAMIDOL (ISOVUE-300) INJECTION 61% COMPARISON:  01/22/2012 FINDINGS: Lower chest: Sliding hiatal hernia, moderate size. Mild atelectasis at the bases. Hepatobiliary: No focal liver abnormality.No evidence of biliary obstruction or stone. Pancreas: Unremarkable. Spleen: Unremarkable. Adrenals/Urinary Tract: Negative adrenals. Solid left renal mass, interpolar and extending into the hilum, 26 x 34 mm, slightly larger than in 2013. No adenopathy or vascular invasive feature. Unremarkable bladder. Stomach/Bowel: There is a segment of colonic wall thickening from submucosal edema, mainly from the distal transverse to sigmoid colon. The colon is diffusely fluid-filled. Colonic diverticulosis without active inflammation. Abrupt transition from decompressed thickened colon to distended sigmoid colon, without discrete mass lesion noted. There is extensive atherosclerosis of the aorta and branch vessels. Quantification of ostial stenosis is not possible on this study. No major branch occlusion is noted. The IMA is patent. Negative for small bowel obstruction. Vascular/Lymphatic: Diffuse atherosclerotic calcification as noted above. No acute vascular finding. No mass or adenopathy. Reproductive:Hysterectomy.  Negative adnexae. Other: Left inguinal hernia containing nonobstructed or inflamed small bowel. There is a right groin hernia containing small bowel, possibly a postoperative hernia given neighboring clips. Additional hernia in the right groin containing ascitic density material. Hernias are progressed from prior. Musculoskeletal: Compression fractures of T8, T9, T10, T12, L1, L5. The lumbar spine was seen previously from L5-T11, and only the L5 fracture was  present at that time. Height loss is advanced at although fractures except at T9, where there is a visible fracture plane and sclerosis, and L5. No retropulsion. Exaggerated thoracolumbar curvature. IMPRESSION: 1. Colitis centered on the splenic flexure; infectious or ischemic colitis could give this appearance. There is extensive atherosclerosis without major branch occlusion. 2. Known solid left renal mass/neoplasm with minimal growth since 2013, 26 x 34 mm today. 3. Compression fractures of T8, T9, T10, T12, L1, and L5. Height loss is advanced at multiple levels. The T9 fracture appears subacute and unhealed. 4. Bilateral groin hernias containing nonobstructed small  bowel. Electronically Signed   By: Monte Fantasia M.D.   On: 07/14/2016 15:23    EKG: not done  Assessment/Plan Principal Problem:   Colitis presumed infectious Active Problems:   Dementia with aggressive behavior   Essential hypertension   Hyperglycemia   Stasis dermatitis of both legs   Pertinent labs:  Glucose 154 BUN 29/Creatinine 1.33/GFR 33; 30/1.14/40 in 7/17 AP 170 Lipase 57 WBC 15.4  Colitis -Patient presenting with acute onset of abdominal pain, n/v, and diarrhea -CT scan shows apparent colitis (as well as numerous other chronic and unremarkable findings) -While this could be ischemic in nature, she has a normal lactate and an elevated WBC count and so it seems more likely infectious -Will treat with NPO, IVF, Cipro/Flagyl -Family reports that patient is very resistant to taking pain medication.  At most at home, she takes 1 Tylenol.  Will try pain control with Tylenol as per family request. -Nausea control with Zofran.  Dementia -Family will try to stay during waking hours to help to orient patient -They are in agreement with trial of low-dose Ativan (0.5 mg q4h prn) to see if this helps agitation -If she gets very agitated, may need to consider Haldol  HTN -Hold Norvasc, Altace, and Bystolic for now due  to borderline hypotension and NPO  Stasis dermatitis -Wound care consult -Leave wrapped overnight until seen by wound care  Hyperglycemia -May be stress response -Will follow with fasting AM labs -It is unlikely that he will need acute or chronic treatment for this issue  DVT prophylaxis:  Lovenox Code Status: DNR - confirmed with family Family Communication: Niece present throughout evaluation; grandson also present and spoke by telephone with the patient's sister Inez Catalina, lives in [place] and is co-HCPOA) Disposition Plan: Likely needs placement; SW consult requested Consults called: SW, CM Admission status: Admit - It is my clinical opinion that admission to INPATIENT is reasonable and necessary because this patient will require at least 2 midnights in the hospital to treat this condition based on the medical complexity of the problems presented.  Given the aforementioned information, the predictability of an adverse outcome is felt to be significant.    [name] MD [facility]  If 7PM-7AM, please contact night-coverage [email or web address] Password [facility]  07/14/2016, 8:45 PM

## 2016-07-15 DIAGNOSIS — I10 Essential (primary) hypertension: Secondary | ICD-10-CM

## 2016-07-15 DIAGNOSIS — K529 Noninfective gastroenteritis and colitis, unspecified: Secondary | ICD-10-CM

## 2016-07-15 DIAGNOSIS — R739 Hyperglycemia, unspecified: Secondary | ICD-10-CM

## 2016-07-15 DIAGNOSIS — F0391 Unspecified dementia with behavioral disturbance: Secondary | ICD-10-CM

## 2016-07-15 DIAGNOSIS — I872 Venous insufficiency (chronic) (peripheral): Secondary | ICD-10-CM

## 2016-07-15 LAB — CBC
HEMATOCRIT: 31.8 % — AB (ref 36.0–46.0)
Hemoglobin: 10.6 g/dL — ABNORMAL LOW (ref 12.0–15.0)
MCH: 32.1 pg (ref 26.0–34.0)
MCHC: 33.3 g/dL (ref 30.0–36.0)
MCV: 96.4 fL (ref 78.0–100.0)
Platelets: 202 10*3/uL (ref 150–400)
RBC: 3.3 MIL/uL — ABNORMAL LOW (ref 3.87–5.11)
RDW: 14.5 % (ref 11.5–15.5)
WBC: 14 10*3/uL — ABNORMAL HIGH (ref 4.0–10.5)

## 2016-07-15 LAB — BASIC METABOLIC PANEL
Anion gap: 8 (ref 5–15)
BUN: 24 mg/dL — AB (ref 6–20)
CHLORIDE: 108 mmol/L (ref 101–111)
CO2: 22 mmol/L (ref 22–32)
Calcium: 8.1 mg/dL — ABNORMAL LOW (ref 8.9–10.3)
Creatinine, Ser: 0.92 mg/dL (ref 0.44–1.00)
GFR calc Af Amer: >60 mL/min (ref >60)
GFR, EST NON AFRICAN AMERICAN: 52 mL/min — AB (ref >60)
GLUCOSE: 91 mg/dL (ref 65–99)
POTASSIUM: 3.5 mmol/L (ref 3.5–5.1)
Sodium: 138 mmol/L (ref 135–145)

## 2016-07-15 MED ORDER — METRONIDAZOLE IN NACL 5-0.79 MG/ML-% IV SOLN
500.0000 mg | Freq: Three times a day (TID) | INTRAVENOUS | Status: DC
  Administered 2016-07-15 – 2016-07-17 (×8): 500 mg via INTRAVENOUS
  Filled 2016-07-15 (×8): qty 100

## 2016-07-15 MED ORDER — MORPHINE SULFATE (PF) 4 MG/ML IV SOLN
1.0000 mg | INTRAVENOUS | Status: AC | PRN
  Administered 2016-07-15 – 2016-07-17 (×2): 1 mg via INTRAVENOUS
  Filled 2016-07-15 (×2): qty 1

## 2016-07-15 MED ORDER — CIPROFLOXACIN IN D5W 400 MG/200ML IV SOLN
400.0000 mg | INTRAVENOUS | Status: DC
  Administered 2016-07-15 – 2016-07-16 (×2): 400 mg via INTRAVENOUS
  Filled 2016-07-15 (×4): qty 200

## 2016-07-15 NOTE — Consult Note (Signed)
[facility] Nurse wound consult note Reason for Consult: Consult requested for bilat legs.  Yolanda Bonine is at the bedside and states patient is followed by the outpatient wound center and uses collagen dressings and bilat compression wraps which are changed Q Thursday.   Wound type: Full thickness stasis ulcers to bilat legs Measurement:Left leg .3X.3cm.1cm and 4X2.5X.1cm and 3X4X.1cm; posterior leg .2X.2X.1cmall are pink and moist, mod amt tan drainage, no odor Right calf with loose eschar; Conservative sharp wound debridement (CSWD performed at the bedside): Easily removed loose eschar using a scalpel and forceps.  Pt tolerated without pain or bleeding, revealing 90% red, 10% yellow wound; 2X2.3X.2cm, small amt tan drainage, no odor.   Dressing procedure/placement/frequency: Collagen dressings are not available in the [facility]; applied Aqaucel and 3 layers of Profore as previously ordered by the outpatient wound care center. (No third layer) Pt can resume follow-up after discharge at that site and dressings can be changed next Thurs.  Discussed plan of care with Grandson and he verbalized understanding. Please re-consult if further assistance is needed.  Thank-you,  Julien Girt MSN, [place], [place], [place], [place]

## 2016-07-15 NOTE — Progress Notes (Signed)
PROGRESS NOTE    [name]  LFY:[id number] DOB: 06-03-1920 DOA: 07/14/2016 PCP: Jani Gravel, MD   Chief Complaint  Patient presents with  . Abdominal Pain  . Weakness    Brief Narrative:  HPI on 07/14/2016 by Dr. [name] Joanne Anderson is a 83 y.o. female with medical history significant of stasis dermatitis of the legs; glaucoma and macular degeneration; HTN; HLD; and mild dementia presenting with abdominal pain.  She reports, "Well honey, I don't think I am gonna make it.  I can't make it through the night, who can come to bring me a drink of water?"  Other history was obtained via the niece and grandson.  The patient had a scheduled doctor's appointment today.  When the grandson got there, she was dressed and ready but complained that she needed to go to the bathroom.  She was weak when she came out and started vomiting and vomited several times in a row.  He called her doctor's office and they suggested bringing her in to the ER.  Since here, very loose BMs x 2-3 in the ER.  No further emesis.  LLQ abdominal pain.  Also with LE edema, legs are wrapped, "heavy" and she reports that she can't lift them. Assessment & Plan   Colitis with vomiting -Patient presented abdominal pain and vomiting -Patient feels abdominal pain has improved, denies any further vomiting or bowel movement since admission -CT abdomen pelvis showed colitis syndrome the splenic flexure -Placed on ciprofloxacin and Flagyl -Will place patient on clear liquid diet and see if patient is able to tolerate -Continue pain control, antiemetics PRN  Dementia  -Patient does have some sundowning  -Continue low dose Ativan and monitor  Essential Hypertension -Norvasc, Altace, bystolic held due to soft BP -Will restart medications when patient able to tolerate PO  Stasis Dermatitis/Lower extremity wounds -Wound care consulted -Grandson states patient goes to wound care  Hyperglycemia  -Possibly stress response vs  chronic home steroids (prednisone) -Currently resolved  Solid left renal mass/neoplasm -Noted on CT scan of abdomen and pelvis, with minimal growth since 2013  Multiple compression fractures -CT showed compression fractures of T8, T9, T10, T12, L1, L5 -Patient does have a known L5 fracture since 2013. -Discussed with grandson, at that time patient was not found to be a surgical candidate. -Will check vitamin D levels -Will consult PT  Hyperlipidemia -Continue statin  DVT Prophylaxis  Lovenox   Code Status: DNR   Family Communication: Grandson at bedside.   Disposition Plan: Admitted. Continue IVF, will advance diet today and see if patient can tolerate.  Consultants None  Procedures  None  Antibiotics   Anti-infectives    Start     Dose/Rate Route Frequency Ordered Stop   07/15/16 1800  ciprofloxacin (CIPRO) IVPB 400 mg     400 mg 200 mL/hr over 60 Minutes Intravenous Every 24 hours 07/15/16 0116     07/15/16 0200  metroNIDAZOLE (FLAGYL) IVPB 500 mg     500 mg 100 mL/hr over 60 Minutes Intravenous Every 8 hours 07/15/16 0116     07/14/16 1700  ciprofloxacin (CIPRO) IVPB 400 mg     400 mg 200 mL/hr over 60 Minutes Intravenous  Once 07/14/16 1652 07/14/16 1923   07/14/16 1700  metroNIDAZOLE (FLAGYL) IVPB 500 mg     500 mg 100 mL/hr over 60 Minutes Intravenous  Once 07/14/16 1652 07/14/16 1822      Subjective:   Olivia Mackie seen and examined today.  Does have some dementia. Feels abdominal pain is mildly better. Denies further episodes of vomiting or diarrhea. Denies chest pain, shortness of breath, dizziness, headache.   Objective:   Vitals:   07/14/16 1830 07/14/16 1900 07/14/16 2008 07/14/16 2055  BP: (!) 119/55 115/67 119/74 (!) 124/53  Pulse: 84 84 74 70  Resp:  16 16 20   Temp:    98.9 F (37.2 C)  TempSrc:    Oral  SpO2: 95% 95% 97% 100%  Weight:    38.4 kg (84 lb 10.5 oz)  Height:       No intake or output data in the 24 hours ending 07/15/16  1141 Filed Weights   07/14/16 1156 07/14/16 2055  Weight: 36.3 kg (80 lb) 38.4 kg (84 lb 10.5 oz)    Exam  General: Well developed, elderly, NAD  HEENT: NCAT, mucous membranes moist.   Cardiovascular: S1 S2 auscultated, RRR, no murmurs appreciated  Respiratory: Clear to auscultation bilaterally with equal chest rise  Abdomen: Soft, mildly TTP worse in LLQ, nondistended, + bowel sounds  Extremities: warm dry without cyanosis clubbing or edema, LE wrapped (wounds noted)  Neuro: AAOx3 (self, place, situation), nonfocal   Skin: LE wounds  Psych: Appropriate mood and affect   Data Reviewed: I have personally reviewed following labs and imaging studies  CBC:  Recent Labs Lab 07/14/16 1208 07/15/16 0330  WBC 15.4* 14.0*  HGB 12.4 10.6*  HCT 37.7 31.8*  MCV 98.2 96.4  PLT 251 528   Basic Metabolic Panel:  Recent Labs Lab 07/14/16 1208 07/15/16 0330  NA 137 138  K 4.1 3.5  CL 101 108  CO2 26 22  GLUCOSE 154* 91  BUN 29* 24*  CREATININE 1.33* 0.92  CALCIUM 9.7 8.1*   GFR: Estimated Creatinine Clearance: 23.2 mL/min (by C-G formula based on SCr of 0.92 mg/dL). Liver Function Tests:  Recent Labs Lab 07/14/16 1208  AST 26  ALT 17  ALKPHOS 170*  BILITOT 0.8  PROT 7.6  ALBUMIN 3.8    Recent Labs Lab 07/14/16 1208  LIPASE 57*   No results for input(s): AMMONIA in the last 168 hours. Coagulation Profile: No results for input(s): INR, PROTIME in the last 168 hours. Cardiac Enzymes: No results for input(s): CKTOTAL, CKMB, CKMBINDEX, TROPONINI in the last 168 hours. BNP (last 3 results) No results for input(s): PROBNP in the last 8760 hours. HbA1C: No results for input(s): HGBA1C in the last 72 hours. CBG: No results for input(s): GLUCAP in the last 168 hours. Lipid Profile: No results for input(s): CHOL, HDL, LDLCALC, TRIG, CHOLHDL, LDLDIRECT in the last 72 hours. Thyroid Function Tests: No results for input(s): TSH, T4TOTAL, FREET4, T3FREE,  THYROIDAB in the last 72 hours. Anemia Panel: No results for input(s): VITAMINB12, FOLATE, FERRITIN, TIBC, IRON, RETICCTPCT in the last 72 hours. Urine analysis:    Component Value Date/Time   COLORURINE YELLOW 09/02/2015 1600   APPEARANCEUR CLEAR 09/02/2015 1600   LABSPEC 1.009 09/02/2015 1600   PHURINE 5.5 09/02/2015 1600   GLUCOSEU NEGATIVE 09/02/2015 1600   HGBUR NEGATIVE 09/02/2015 1600   BILIRUBINUR NEGATIVE 09/02/2015 1600   KETONESUR NEGATIVE 09/02/2015 1600   PROTEINUR NEGATIVE 09/02/2015 1600   UROBILINOGEN 0.2 07/29/2007 2341   NITRITE NEGATIVE 09/02/2015 1600   LEUKOCYTESUR NEGATIVE 09/02/2015 1600   Sepsis Labs: @LABRCNTIP (procalcitonin:4,lacticidven:4)  )No results found for this or any previous visit (from the past 240 hour(s)).    Radiology Studies: Ct Abdomen Pelvis W Contrast  Result Date: 07/14/2016 CLINICAL DATA:  Left lower quadrant pain EXAM: CT ABDOMEN AND PELVIS WITH CONTRAST TECHNIQUE: Multidetector CT imaging of the abdomen and pelvis was performed using the standard protocol following bolus administration of intravenous contrast. CONTRAST:  24mL ISOVUE-300 IOPAMIDOL (ISOVUE-300) INJECTION 61% COMPARISON:  01/22/2012 FINDINGS: Lower chest: Sliding hiatal hernia, moderate size. Mild atelectasis at the bases. Hepatobiliary: No focal liver abnormality.No evidence of biliary obstruction or stone. Pancreas: Unremarkable. Spleen: Unremarkable. Adrenals/Urinary Tract: Negative adrenals. Solid left renal mass, interpolar and extending into the hilum, 26 x 34 mm, slightly larger than in 2013. No adenopathy or vascular invasive feature. Unremarkable bladder. Stomach/Bowel: There is a segment of colonic wall thickening from submucosal edema, mainly from the distal transverse to sigmoid colon. The colon is diffusely fluid-filled. Colonic diverticulosis without active inflammation. Abrupt transition from decompressed thickened colon to distended sigmoid colon, without  discrete mass lesion noted. There is extensive atherosclerosis of the aorta and branch vessels. Quantification of ostial stenosis is not possible on this study. No major branch occlusion is noted. The IMA is patent. Negative for small bowel obstruction. Vascular/Lymphatic: Diffuse atherosclerotic calcification as noted above. No acute vascular finding. No mass or adenopathy. Reproductive:Hysterectomy.  Negative adnexae. Other: Left inguinal hernia containing nonobstructed or inflamed small bowel. There is a right groin hernia containing small bowel, possibly a postoperative hernia given neighboring clips. Additional hernia in the right groin containing ascitic density material. Hernias are progressed from prior. Musculoskeletal: Compression fractures of T8, T9, T10, T12, L1, L5. The lumbar spine was seen previously from L5-T11, and only the L5 fracture was present at that time. Height loss is advanced at although fractures except at T9, where there is a visible fracture plane and sclerosis, and L5. No retropulsion. Exaggerated thoracolumbar curvature. IMPRESSION: 1. Colitis centered on the splenic flexure; infectious or ischemic colitis could give this appearance. There is extensive atherosclerosis without major branch occlusion. 2. Known solid left renal mass/neoplasm with minimal growth since 2013, 26 x 34 mm today. 3. Compression fractures of T8, T9, T10, T12, L1, and L5. Height loss is advanced at multiple levels. The T9 fracture appears subacute and unhealed. 4. Bilateral groin hernias containing nonobstructed small bowel. Electronically Signed   By: Monte Fantasia M.D.   On: 07/14/2016 15:23     Scheduled Meds: . brimonidine  1 drop Both Eyes TID  . dorzolamide-timolol  1 drop Both Eyes BID  . enoxaparin (LOVENOX) injection  20 mg Subcutaneous Q24H  . fluvoxaMINE  25 mg Oral QHS  . latanoprost  1 drop Both Eyes QHS  . pilocarpine  1 drop Left Eye QID  . pravastatin  10 mg Oral q1800  . predniSONE   5 mg Oral Q breakfast   Continuous Infusions: . sodium chloride 75 mL/hr at 07/14/16 2320  . ciprofloxacin    . metronidazole 500 mg (07/15/16 0955)     LOS: 1 day   Time Spent in minutes   30 minutes  Karagan Lehr D.O. on 07/15/2016 at 11:41 AM  Between 7am to 7pm - Pager - [phone]  After 7pm go to [email or web address] - password [email or web address]  And look for the night coverage person covering for me after hours  [facility] Office  [phone]

## 2016-07-15 NOTE — Evaluation (Signed)
Clinical/Bedside Swallow Evaluation Patient Details  Name: Joanne Anderson MRN: [id number] Date of Birth: 02-16-1921  Today's Date: 07/15/2016 Time: SLP Start Time (ACUTE ONLY): 1030 SLP Stop Time (ACUTE ONLY): 1051 SLP Time Calculation (min) (ACUTE ONLY): 21 min  Past Medical History:  Past Medical History:  Diagnosis Date  . Dementia   . Glaucoma   . Hyperlipidemia   . Hypertension   . Macular degeneration   . Peripheral neuropathy   . Stasis dermatitis of both legs    goes to the [facility]   Past Surgical History:  Past Surgical History:  Procedure Laterality Date  . CHOLECYSTECTOMY    . EYE SURGERY    . HEMICOLECTOMY     remote   HPI:  83 yo female adm to [facility] with N/V and weakness- presumed to be infectious colitis.  Pt denies dysphagia and grandson denies pt coughing with intake prior to admission.  PMH + for peripheral neuropathy, multiple compression fx of thoracic and lumbar spine, HTN, HLD, macular degeneration, dementia, resides alone and grandson reports he checks on her daily (before or after work).   Pt imaging studies showed mild atelectasis bilateral lungs, moderate sized sliding scale hiatal hernia,  Swallow evaluation was ordered.    Assessment / Plan / Recommendation Clinical Impression  Pt presents with functional swallow with limited po able to provide *only small amount of water per RN due to pt's colitis.   No indications of airway compromise with approximately one ounce of water given.  Swallow was timely and laryngeal elevation appeared adequate when palpated at bedside.  Recommend advance diet as MD indicates.  No SLP follow up indicated.  Thanks for this consult.  SLP Visit Diagnosis: Dysphagia, oral phase (R13.11)    Aspiration Risk  Mild aspiration risk (due to known h/o hiatal hernia)    Diet Recommendation Regular;Thin liquid   Liquid Administration via: Cup;Straw Medication Administration: Whole meds with liquid Supervision: Patient able  to self feed Compensations: Slow rate;Small sips/bites Postural Changes: Seated upright at 90 degrees    Other  Recommendations Oral Care Recommendations: Oral care BID   Follow up Recommendations None      Frequency and Duration            Prognosis        Swallow Study   General Date of Onset: 07/15/16 HPI: 83 yo female adm to [facility] with N/V and weakness- presumed to be infectious colitis.  Pt denies dysphagia and grandson denies pt coughing with intake prior to admission.  PMH + for peripheral neuropathy, multiple compression fx of thoracic and lumbar spine, HTN, HLD, macular degeneration, dementia, resides alone and grandson reports he checks on her daily (before or after work).   Pt imaging studies showed mild atelectasis bilateral lungs, moderate sized sliding scale hiatal hernia,  Swallow evaluation was ordered.  Type of Study: Bedside Swallow Evaluation Diet Prior to this Study: NPO Temperature Spikes Noted: No Respiratory Status: Room air History of Recent Intubation: No Behavior/Cognition: Alert;Cooperative Oral Cavity Assessment: Dry Oral Care Completed by SLP: No Oral Cavity - Dentition: Other (Comment);Adequate natural dentition (has a partial and a few teeth removed recently) Vision: Functional for self-feeding Patient Positioning: Upright in bed Baseline Vocal Quality: Normal Volitional Cough: Other (Comment) (dnt due to concerns for thoracic fxs) Volitional Swallow: Able to elicit    Oral/Motor/Sensory Function Overall Oral Motor/Sensory Function: Within functional limits   Ice Chips Ice chips: Not tested   Thin Liquid Thin Liquid: Within  functional limits Presentation: Cup;Straw;Self Fed    Nectar Thick Nectar Thick Liquid: Not tested   Honey Thick Honey Thick Liquid: Not tested   Puree Puree: Not tested   Solid   GO   Solid: Not tested        Claudie Fisherman, [facility] SLP [phone]

## 2016-07-16 DIAGNOSIS — E876 Hypokalemia: Secondary | ICD-10-CM

## 2016-07-16 LAB — CBC
HEMATOCRIT: 32.5 % — AB (ref 36.0–46.0)
Hemoglobin: 10.8 g/dL — ABNORMAL LOW (ref 12.0–15.0)
MCH: 32.3 pg (ref 26.0–34.0)
MCHC: 33.2 g/dL (ref 30.0–36.0)
MCV: 97.3 fL (ref 78.0–100.0)
Platelets: 181 10*3/uL (ref 150–400)
RBC: 3.34 MIL/uL — ABNORMAL LOW (ref 3.87–5.11)
RDW: 14.7 % (ref 11.5–15.5)
WBC: 14.3 10*3/uL — ABNORMAL HIGH (ref 4.0–10.5)

## 2016-07-16 LAB — BASIC METABOLIC PANEL
Anion gap: 7 (ref 5–15)
BUN: 20 mg/dL (ref 6–20)
CALCIUM: 8.3 mg/dL — AB (ref 8.9–10.3)
CO2: 21 mmol/L — AB (ref 22–32)
CREATININE: 0.78 mg/dL (ref 0.44–1.00)
Chloride: 108 mmol/L (ref 101–111)
GFR calc Af Amer: >60 mL/min (ref >60)
GFR calc non Af Amer: >60 mL/min (ref >60)
GLUCOSE: 85 mg/dL (ref 65–99)
Potassium: 3.2 mmol/L — ABNORMAL LOW (ref 3.5–5.1)
Sodium: 136 mmol/L (ref 135–145)

## 2016-07-16 LAB — VITAMIN D 25 HYDROXY (VIT D DEFICIENCY, FRACTURES): Vit D, 25-Hydroxy: 24.8 ng/mL — ABNORMAL LOW (ref 30.0–100.0)

## 2016-07-16 MED ORDER — POTASSIUM CHLORIDE 20 MEQ/15ML (10%) PO SOLN
40.0000 meq | Freq: Every day | ORAL | Status: DC
  Administered 2016-07-16 – 2016-07-17 (×2): 40 meq via ORAL
  Filled 2016-07-16 (×2): qty 30

## 2016-07-16 MED ORDER — CHOLECALCIFEROL 10 MCG (400 UNIT) PO TABS
400.0000 [IU] | ORAL_TABLET | Freq: Every day | ORAL | Status: DC
  Administered 2016-07-16 – 2016-07-17 (×2): 400 [IU] via ORAL
  Filled 2016-07-16 (×4): qty 1

## 2016-07-16 MED ORDER — LEVOTHYROXINE SODIUM 25 MCG PO TABS: 12.5000 ug | ORAL_TABLET | ORAL | Status: DC

## 2016-07-16 NOTE — Clinical Social Work Placement (Addendum)
   CLINICAL SOCIAL WORK PLACEMENT  NOTE  Date:  07/16/2016  Patient Details  Name: Joanne Anderson MRN: [id number] Date of Birth: Feb 09, 1921  Clinical Social Work is seeking post-discharge placement for this patient at the [place] level of care (*CSW will initial, date and re-position this form in  chart as items are completed):  Yes   Patient/family provided with [facility] Work Department's list of facilities offering this level of care within the geographic area requested by the patient (or if unable, by the patient's family).  Yes   Patient/family informed of their freedom to choose among providers that offer the needed level of care, that participate in Medicare, Medicaid or managed care program needed by the patient, have an available bed and are willing to accept the patient.  Yes   Patient/family informed of [facility]'s ownership interest in [facility] and [facility], as well as of the fact that they are under no obligation to receive care at these facilities.  PASRR submitted to EDS on 07/16/16     PASRR number received on 07/17/16       Existing PASRR number confirmed on       FL2 transmitted to all facilities in geographic area requested by pt/family on 07/16/16     FL2 transmitted to all facilities within larger geographic area on       Patient informed that his/her managed care company has contracts with or will negotiate with certain facilities, including the following:            Patient/family informed of bed offers received.  Patient chooses bed at     Anderson recommends and patient chooses bed at     [facility] Patient to be transferred to   [facility] on  . 07/17/16  Patient to be transferred to facility by     [place]  Patient family notified on   5/18/18of transfer.  Name of family member notified:      Sylvie Farrier  PHYSICIAN       Additional Comment:     _______________________________________________ Nila Nephew, LCSW 07/16/2016, 4:22 PM  [phone]

## 2016-07-16 NOTE — NC FL2 (Signed)
[place] LEVEL OF CARE SCREENING TOOL     IDENTIFICATION  Patient Name: Joanne Anderson Birthdate: 09/28/1920 Sex: female Admission Date (Current Location): 07/14/2016  [facility] and [place] Number:  Herbalist and Address:  [facility],  [place] [address] [place]      Provider Number: [id number]  Attending Physician Name and Address:  Cristal Ford, DO  Relative Name and Phone Number:       Current Level of Care: Hospital Recommended Level of Care: [place] Prior Approval Number:    Date Approved/Denied:   PASRR Number:    Discharge Plan: SNF    Current Diagnoses: Patient Active Problem List   Diagnosis Date Noted  . Colitis presumed infectious 07/14/2016  . Essential hypertension 07/14/2016  . Hyperlipidemia 07/14/2016  . Hyperglycemia 07/14/2016  . Stasis dermatitis of both legs 07/14/2016  . Dementia with aggressive behavior 09/03/2015    Orientation RESPIRATION BLADDER Height & Weight     Self, Situation, Place  Normal Incontinent Weight: 84 lb 10.5 oz (38.4 kg) Height:  5' (152.4 cm)  BEHAVIORAL SYMPTOMS/MOOD NEUROLOGICAL BOWEL NUTRITION STATUS      Incontinent Diet (full liquid)  AMBULATORY STATUS COMMUNICATION OF NEEDS Skin   Limited Assist Verbally Normal                       Personal Care Assistance Level of Assistance  Bathing, Feeding, Dressing Bathing Assistance: Limited assistance Feeding assistance: Limited assistance Dressing Assistance: Limited assistance     Functional Limitations Info  Sight, Hearing, Speech Sight Info: Impaired Hearing Info: Impaired Speech Info: Adequate    SPECIAL CARE FACTORS FREQUENCY  PT (By licensed PT), OT (By licensed OT)     PT Frequency: 5x OT Frequency: 5x            Contractures Contractures Info: Not present    Additional Factors Info  Allergies, Code Status Code Status Info: DNR Allergies Info: Codeine, Nsaids,  Gabapentin, Other           Current Medications (07/16/2016):  This is the current hospital active medication list Current Facility-Administered Medications  Medication Dose Route Frequency Provider Last Rate Last Dose  . 0.9 %  sodium chloride infusion   Intravenous Continuous [name], MD 75 mL/hr at 07/16/16 0134    . acetaminophen (TYLENOL) tablet 650 mg  650 mg Oral Q6H PRN [name], MD   650 mg at 07/15/16 1341   Or  . acetaminophen (TYLENOL) suppository 650 mg  650 mg Rectal Q6H PRN [name], MD      . brimonidine (ALPHAGAN) 0.2 % ophthalmic solution 1 drop  1 drop Both Eyes TID [name], MD   1 drop at 07/16/16 1126  . cholecalciferol (VITAMIN D) tablet 400 Units  400 Units Oral Daily [place], [place], DO      . ciprofloxacin (CIPRO) IVPB 400 mg  400 mg Intravenous Q24H [name], MD   Stopped at 07/15/16 1854  . dorzolamide-timolol (COSOPT) 22.3-6.8 MG/ML ophthalmic solution 1 drop  1 drop Both Eyes BID [name], MD   1 drop at 07/16/16 1127  . enoxaparin (LOVENOX) injection 20 mg  20 mg Subcutaneous Q24H [name], MD   20 mg at 07/15/16 2220  . fluvoxaMINE (LUVOX) tablet 25 mg  25 mg Oral Ivery Quale, MD   25 mg at 07/15/16 2219  . latanoprost (XALATAN) 0.005 % ophthalmic solution 1 drop  1 drop  Both Eyes QHS [name], MD   1 drop at 07/15/16 2225  . [START ON 07/18/2016] levothyroxine (SYNTHROID, LEVOTHROID) tablet 12.5 mcg  12.5 mcg Oral Q [address] [place], DO      . LORazepam (ATIVAN) injection 0.5 mg  0.5 mg Intravenous Q4H PRN [name], MD      . metroNIDAZOLE (FLAGYL) IVPB 500 mg  500 mg Intravenous Lynne Logan, MD   Stopped at 07/16/16 1228  . morphine 4 MG/ML injection 1 mg  1 mg Intravenous Q4H PRN Gardiner Barefoot, NP   1 mg at 07/15/16 0120  . ondansetron (ZOFRAN) tablet 4 mg  4 mg Oral Q6H PRN [name], MD       Or  . ondansetron [facility]) injection 4 mg  4 mg Intravenous Q6H PRN  [name], MD      . pilocarpine (PILOCAR) 4 % ophthalmic solution 1 drop  1 drop Left Eye QID [name], MD   1 drop at 07/16/16 1435  . potassium chloride 20 MEQ/15ML (10%) solution 40 mEq  40 mEq Oral Daily [place], [place], DO   40 mEq at 07/16/16 1000  . pravastatin (PRAVACHOL) tablet 10 mg  10 mg Oral q1800 [name], MD   10 mg at 07/15/16 1715  . predniSONE (DELTASONE) tablet 5 mg  5 mg Oral Q breakfast [name], MD   5 mg at 07/16/16 1000     Discharge Medications: Please see discharge summary for a list of discharge medications.  Relevant Imaging Results:  Relevant Lab Results:   Additional Information SS# [id number]  Nila Nephew, LCSW

## 2016-07-16 NOTE — Progress Notes (Addendum)
PROGRESS NOTE    Joanne Anderson  ZHG:[id number] DOB: 08-12-1920 DOA: 07/14/2016 PCP: Jani Gravel, MD   Chief Complaint  Patient presents with  . Abdominal Pain  . Weakness    Brief Narrative:  HPI on 07/14/2016 by Dr. [name] Joanne Anderson is a 83 y.o. female with medical history significant of stasis dermatitis of the legs; glaucoma and macular degeneration; HTN; HLD; and mild dementia presenting with abdominal pain.  She reports, "Well honey, I don't think I am gonna make it.  I can't make it through the night, who can come to bring me a drink of water?"  Other history was obtained via the niece and grandson.  The patient had a scheduled doctor's appointment today.  When the grandson got there, she was dressed and ready but complained that she needed to go to the bathroom.  She was weak when she came out and started vomiting and vomited several times in a row.  He called her doctor's office and they suggested bringing her in to the ER.  Since here, very loose BMs x 2-3 in the ER.  No further emesis.  LLQ abdominal pain.  Also with LE edema, legs are wrapped, "heavy" and she reports that she can't lift them. Assessment & Plan   Colitis with vomiting -Patient presented abdominal pain and vomiting -Denies further vomiting or abdominal pain since admission -CT abdomen pelvis showed colitis syndrome the splenic flexure -Continue ciprofloxacin and Flagyl -placed on clear liquid diet, and was able to tolerate, will continue to advance  -Continue pain control, antiemetics PRN  Dementia  -Patient does have some sundowning  -Continue low dose Ativan and monitor  Essential Hypertension -Norvasc, Altace, bystolic held due to soft BP -BP has been stable, currently 133/74 on no medications -Will resume medications as needed  Stasis Dermatitis/Lower extremity wounds -Wound care consulted, Aquacel and 3 layers of profore applied  -Wound care spoke with Grandson, and will continue outpatient  follow up with wound care  Hyperglycemia  -Possibly stress response vs chronic home steroids (prednisone) -Currently resolved on BMP  Solid left renal mass/neoplasm -Noted on CT scan of abdomen and pelvis, with minimal growth since 2013  Multiple compression fractures -CT showed compression fractures of T8, T9, T10, T12, L1, L5 -Patient does have a known L5 fracture since 2013. -Discussed with grandson, at that time patient was not found to be a surgical candidate. -Vitamin D (-25) level 24.8, will start on Vit D supplementation  Hypokalemia -Will replace and continue to monitor  Hyperlipidemia -Continue statin  DVT Prophylaxis  Lovenox   Code Status: DNR   Family Communication: None at bedside.   Disposition Plan: Admitted. Continue IVF, continue to advance diet. Pending PT eval.   Consultants None  Procedures  None  Antibiotics   Anti-infectives    Start     Dose/Rate Route Frequency Ordered Stop   07/15/16 1800  ciprofloxacin (CIPRO) IVPB 400 mg     400 mg 200 mL/hr over 60 Minutes Intravenous Every 24 hours 07/15/16 0116     07/15/16 0200  metroNIDAZOLE (FLAGYL) IVPB 500 mg     500 mg 100 mL/hr over 60 Minutes Intravenous Every 8 hours 07/15/16 0116     07/14/16 1700  ciprofloxacin (CIPRO) IVPB 400 mg     400 mg 200 mL/hr over 60 Minutes Intravenous  Once 07/14/16 1652 07/14/16 1923   07/14/16 1700  metroNIDAZOLE (FLAGYL) IVPB 500 mg     500 mg 100 mL/hr over  60 Minutes Intravenous  Once 07/14/16 1652 07/14/16 1822      Subjective:   Joanne Anderson seen and examined today. Does have dementia. Feels abdominal pain is mildly better and was able to have a bowel movement today. Complains of feeling bad today, but cannot elaborate. Denies chest pain, shortness of breath.  Objective:   Vitals:   07/14/16 2055 07/15/16 1451 07/15/16 2045 07/16/16 0505  BP: (!) 124/53 (!) 111/55 95/61 133/74  Pulse: 70 73 74 89  Resp: 20 17 20 20   Temp: 98.9 F (37.2 C) 98.3  F (36.8 C) 98.4 F (36.9 C) 98.1 F (36.7 C)  TempSrc: Oral Oral Oral Oral  SpO2: 100% 96% 97% 97%  Weight: 38.4 kg (84 lb 10.5 oz)     Height:        Intake/Output Summary (Last 24 hours) at 07/16/16 1002 Last data filed at 07/16/16 0811  Gross per 24 hour  Intake          5385.25 ml  Output                0 ml  Net          5385.25 ml   Filed Weights   07/14/16 1156 07/14/16 2055  Weight: 36.3 kg (80 lb) 38.4 kg (84 lb 10.5 oz)    Exam  General: Well developed, thin, elderly lady  HEENT: NCAT, mucous membranes moist.   Cardiovascular: S1 S2 auscultated, RRR, soft murmur  Respiratory: CTAB  Abdomen: Soft, nontender, nondistended, + bowel sounds  Extremities: warm dry without cyanosis clubbing or edema, LE wrapped   Neuro: AAOx2 (self and place), nonfocal   Psych: Pleasant   Data Reviewed: I have personally reviewed following labs and imaging studies  CBC:  Recent Labs Lab 07/14/16 1208 07/15/16 0330 07/16/16 0326  WBC 15.4* 14.0* 14.3*  HGB 12.4 10.6* 10.8*  HCT 37.7 31.8* 32.5*  MCV 98.2 96.4 97.3  PLT 251 202 850   Basic Metabolic Panel:  Recent Labs Lab 07/14/16 1208 07/15/16 0330 07/16/16 0326  NA 137 138 136  K 4.1 3.5 3.2*  CL 101 108 108  CO2 26 22 21*  GLUCOSE 154* 91 85  BUN 29* 24* 20  CREATININE 1.33* 0.92 0.78  CALCIUM 9.7 8.1* 8.3*   GFR: Estimated Creatinine Clearance: 26.6 mL/min (by C-G formula based on SCr of 0.78 mg/dL). Liver Function Tests:  Recent Labs Lab 07/14/16 1208  AST 26  ALT 17  ALKPHOS 170*  BILITOT 0.8  PROT 7.6  ALBUMIN 3.8    Recent Labs Lab 07/14/16 1208  LIPASE 57*   No results for input(s): AMMONIA in the last 168 hours. Coagulation Profile: No results for input(s): INR, PROTIME in the last 168 hours. Cardiac Enzymes: No results for input(s): CKTOTAL, CKMB, CKMBINDEX, TROPONINI in the last 168 hours. BNP (last 3 results) No results for input(s): PROBNP in the last 8760  hours. HbA1C: No results for input(s): HGBA1C in the last 72 hours. CBG: No results for input(s): GLUCAP in the last 168 hours. Lipid Profile: No results for input(s): CHOL, HDL, LDLCALC, TRIG, CHOLHDL, LDLDIRECT in the last 72 hours. Thyroid Function Tests: No results for input(s): TSH, T4TOTAL, FREET4, T3FREE, THYROIDAB in the last 72 hours. Anemia Panel: No results for input(s): VITAMINB12, FOLATE, FERRITIN, TIBC, IRON, RETICCTPCT in the last 72 hours. Urine analysis:    Component Value Date/Time   COLORURINE YELLOW 09/02/2015 1600   APPEARANCEUR CLEAR 09/02/2015 1600   LABSPEC 1.009 09/02/2015  1600   PHURINE 5.5 09/02/2015 1600   GLUCOSEU NEGATIVE 09/02/2015 1600   HGBUR NEGATIVE 09/02/2015 1600   BILIRUBINUR NEGATIVE 09/02/2015 1600   KETONESUR NEGATIVE 09/02/2015 1600   PROTEINUR NEGATIVE 09/02/2015 1600   UROBILINOGEN 0.2 07/29/2007 2341   NITRITE NEGATIVE 09/02/2015 1600   LEUKOCYTESUR NEGATIVE 09/02/2015 1600   Sepsis Labs: @LABRCNTIP (procalcitonin:4,lacticidven:4)  )No results found for this or any previous visit (from the past 240 hour(s)).    Radiology Studies: Ct Abdomen Pelvis W Contrast  Result Date: 07/14/2016 CLINICAL DATA:  Left lower quadrant pain EXAM: CT ABDOMEN AND PELVIS WITH CONTRAST TECHNIQUE: Multidetector CT imaging of the abdomen and pelvis was performed using the standard protocol following bolus administration of intravenous contrast. CONTRAST:  48mL ISOVUE-300 IOPAMIDOL (ISOVUE-300) INJECTION 61% COMPARISON:  01/22/2012 FINDINGS: Lower chest: Sliding hiatal hernia, moderate size. Mild atelectasis at the bases. Hepatobiliary: No focal liver abnormality.No evidence of biliary obstruction or stone. Pancreas: Unremarkable. Spleen: Unremarkable. Adrenals/Urinary Tract: Negative adrenals. Solid left renal mass, interpolar and extending into the hilum, 26 x 34 mm, slightly larger than in 2013. No adenopathy or vascular invasive feature. Unremarkable  bladder. Stomach/Bowel: There is a segment of colonic wall thickening from submucosal edema, mainly from the distal transverse to sigmoid colon. The colon is diffusely fluid-filled. Colonic diverticulosis without active inflammation. Abrupt transition from decompressed thickened colon to distended sigmoid colon, without discrete mass lesion noted. There is extensive atherosclerosis of the aorta and branch vessels. Quantification of ostial stenosis is not possible on this study. No major branch occlusion is noted. The IMA is patent. Negative for small bowel obstruction. Vascular/Lymphatic: Diffuse atherosclerotic calcification as noted above. No acute vascular finding. No mass or adenopathy. Reproductive:Hysterectomy.  Negative adnexae. Other: Left inguinal hernia containing nonobstructed or inflamed small bowel. There is a right groin hernia containing small bowel, possibly a postoperative hernia given neighboring clips. Additional hernia in the right groin containing ascitic density material. Hernias are progressed from prior. Musculoskeletal: Compression fractures of T8, T9, T10, T12, L1, L5. The lumbar spine was seen previously from L5-T11, and only the L5 fracture was present at that time. Height loss is advanced at although fractures except at T9, where there is a visible fracture plane and sclerosis, and L5. No retropulsion. Exaggerated thoracolumbar curvature. IMPRESSION: 1. Colitis centered on the splenic flexure; infectious or ischemic colitis could give this appearance. There is extensive atherosclerosis without major branch occlusion. 2. Known solid left renal mass/neoplasm with minimal growth since 2013, 26 x 34 mm today. 3. Compression fractures of T8, T9, T10, T12, L1, and L5. Height loss is advanced at multiple levels. The T9 fracture appears subacute and unhealed. 4. Bilateral groin hernias containing nonobstructed small bowel. Electronically Signed   By: Monte Fantasia M.D.   On: 07/14/2016 15:23      Scheduled Meds: . brimonidine  1 drop Both Eyes TID  . dorzolamide-timolol  1 drop Both Eyes BID  . enoxaparin (LOVENOX) injection  20 mg Subcutaneous Q24H  . fluvoxaMINE  25 mg Oral QHS  . latanoprost  1 drop Both Eyes QHS  . pilocarpine  1 drop Left Eye QID  . potassium chloride  40 mEq Oral Daily  . pravastatin  10 mg Oral q1800  . predniSONE  5 mg Oral Q breakfast   Continuous Infusions: . sodium chloride 75 mL/hr at 07/16/16 0134  . ciprofloxacin Stopped (07/15/16 1854)  . metronidazole Stopped (07/16/16 0232)     LOS: 2 days   Time Spent in minutes  30 minutes  Derral Colucci D.O. on 07/16/2016 at 10:02 AM  Between 7am to 7pm - Pager - [phone]  After 7pm go to [email or web address] - password [email or web address]  And look for the night coverage person covering for me after hours  [facility] Office  [phone]

## 2016-07-16 NOTE — Evaluation (Signed)
Physical Therapy Evaluation Patient Details Name: Joanne Anderson MRN: [id number] DOB: [birth year] Today's Date: 07/16/2016   History of Present Illness  83 y.o. female with medical history significant of stasis dermatitis of the legs, glaucoma and macular degeneration, HTN, HLD, and mild dementia and admitted for colitis.  CT showed compression fractures of T8, T9, T10, T12, L1, L5  Clinical Impression  Pt admitted with above diagnosis. Pt currently with functional limitations due to the deficits listed below (see PT Problem List).  Pt will benefit from skilled PT to increase their independence and safety with mobility to allow discharge to the venue listed below.  Pt assisted OOB and having loose BM so brought BSC to pt.  Pt requiring at least min-mod assist for mobility at this time.  Grandson present and reports he assists pt at home.  Pt is typically ambulatory with SPC and home alone half the day.  Grandson agreeable to SNF upon d/c.     Follow Up Recommendations SNF;Supervision/Assistance - 24 hour    Equipment Recommendations  Rolling walker with 5" wheels    Recommendations for Other Services       Precautions / Restrictions Precautions Precautions: Fall      Mobility  Bed Mobility Overal bed mobility: Needs Assistance Bed Mobility: Supine to Sit;Sit to Supine     Supine to sit: Mod assist Sit to supine: Mod assist   General bed mobility comments: grandson provided assist, required assist for trunk upright and LEs onto bed  Transfers Overall transfer level: Needs assistance Equipment used: Rolling walker (2 wheeled) Transfers: Sit to/from [facility] Sit to Stand: Min assist Stand pivot transfers: Min assist       General transfer comment: verbal cues for safe technique, assist to rise, steady and control descent, pt with BM upon first couple steps so provided [facility]  Ambulation/Gait Ambulation/Gait assistance: Min assist Ambulation Distance (Feet):  15 Feet Assistive device: Rolling walker (2 wheeled) Gait Pattern/deviations: Step-through pattern;Decreased stride length;Trunk flexed     General Gait Details: pt assisted with ambulating in room after using BSC, fatigues quickly, assist to steady  Stairs            Wheelchair Mobility    Modified Rankin (Stroke Patients Only)       Balance Overall balance assessment: Needs assistance         Standing balance support: Bilateral upper extremity supported Standing balance-Leahy Scale: Poor                               Pertinent Vitals/Pain Pain Assessment: No/denies pain    Home Living Family/patient expects to be discharged to:: Private residence Living Arrangements: Alone Available Help at Discharge: Family;Available PRN/intermittently (grandson assists part of the day) Type of Home: House       Home Layout: One level Home Equipment: Cane - single point      Prior Function Level of Independence: Needs assistance   Gait / Transfers Assistance Needed: requires at least supervision, uses SPC, poor vision  ADL's / Homemaking Assistance Needed: requires assist        Hand Dominance        Extremity/Trunk Assessment   Upper Extremity Assessment Upper Extremity Assessment: Generalized weakness    Lower Extremity Assessment Lower Extremity Assessment: Generalized weakness    Cervical / Trunk Assessment Cervical / Trunk Assessment: Kyphotic  Communication   Communication: HOH  Cognition Arousal/Alertness: Awake/alert Behavior During Therapy:  WFL for tasks assessed/performed Overall Cognitive Status: History of cognitive impairments - at baseline                                        General Comments      Exercises     Assessment/Plan    PT Assessment Patient needs continued PT services  PT Problem List Decreased strength;Decreased mobility;Decreased activity tolerance;Decreased balance;Decreased knowledge  of use of DME       PT Treatment Interventions Gait training;DME instruction;Therapeutic activities;Therapeutic exercise;Functional mobility training;Balance training;Wheelchair mobility training;Patient/family education    PT Goals (Current goals can be found in the Care Plan section)  Acute Rehab PT Goals PT Goal Formulation: With patient/family Time For Goal Achievement: 07/23/16 Potential to Achieve Goals: Good    Frequency Min 2X/week   Barriers to discharge        Co-evaluation               AM-PAC PT "6 Clicks" Daily Activity  Outcome Measure Difficulty turning over in bed (including adjusting bedclothes, sheets and blankets)?: A Little Difficulty moving from lying on back to sitting on the side of the bed? : A Lot Difficulty sitting down on and standing up from a chair with arms (e.g., wheelchair, bedside commode, etc,.)?: A Lot Help needed moving to and from a bed to chair (including a wheelchair)?: A Little Help needed walking in hospital room?: A Little Help needed climbing 3-5 steps with a railing? : A Lot 6 Click Score: 15    End of Session Equipment Utilized During Treatment: Gait belt Activity Tolerance: Patient limited by fatigue Patient left: in bed;with call bell/phone within reach;with family/visitor present;with bed alarm set   PT Visit Diagnosis: Other abnormalities of gait and mobility (R26.89)    Time: 1696-7893 PT Time Calculation (min) (ACUTE ONLY): 22 min   Charges:   PT Evaluation $PT Eval Low Complexity: 1 Procedure     PT G CodesCarmelia Bake, PT, DPT 07/16/2016 Pager: [phone]   York Ram E 07/16/2016, 12:34 PM

## 2016-07-17 DIAGNOSIS — L97812 Non-pressure chronic ulcer of other part of right lower leg with fat layer exposed: Secondary | ICD-10-CM | POA: Diagnosis not present

## 2016-07-17 DIAGNOSIS — H409 Unspecified glaucoma: Secondary | ICD-10-CM | POA: Diagnosis not present

## 2016-07-17 DIAGNOSIS — B372 Candidiasis of skin and nail: Secondary | ICD-10-CM | POA: Diagnosis not present

## 2016-07-17 DIAGNOSIS — F0390 Unspecified dementia without behavioral disturbance: Secondary | ICD-10-CM | POA: Diagnosis not present

## 2016-07-17 DIAGNOSIS — N179 Acute kidney failure, unspecified: Secondary | ICD-10-CM | POA: Diagnosis not present

## 2016-07-17 DIAGNOSIS — B999 Unspecified infectious disease: Secondary | ICD-10-CM | POA: Diagnosis not present

## 2016-07-17 DIAGNOSIS — R2689 Other abnormalities of gait and mobility: Secondary | ICD-10-CM | POA: Diagnosis not present

## 2016-07-17 DIAGNOSIS — H353231 Exudative age-related macular degeneration, bilateral, with active choroidal neovascularization: Secondary | ICD-10-CM | POA: Diagnosis not present

## 2016-07-17 DIAGNOSIS — R4182 Altered mental status, unspecified: Secondary | ICD-10-CM | POA: Diagnosis not present

## 2016-07-17 DIAGNOSIS — I87333 Chronic venous hypertension (idiopathic) with ulcer and inflammation of bilateral lower extremity: Secondary | ICD-10-CM | POA: Diagnosis not present

## 2016-07-17 DIAGNOSIS — R634 Abnormal weight loss: Secondary | ICD-10-CM | POA: Diagnosis not present

## 2016-07-17 DIAGNOSIS — E039 Hypothyroidism, unspecified: Secondary | ICD-10-CM | POA: Diagnosis not present

## 2016-07-17 DIAGNOSIS — L97822 Non-pressure chronic ulcer of other part of left lower leg with fat layer exposed: Secondary | ICD-10-CM | POA: Diagnosis not present

## 2016-07-17 DIAGNOSIS — L97211 Non-pressure chronic ulcer of right calf limited to breakdown of skin: Secondary | ICD-10-CM | POA: Diagnosis not present

## 2016-07-17 DIAGNOSIS — F0391 Unspecified dementia with behavioral disturbance: Secondary | ICD-10-CM | POA: Diagnosis not present

## 2016-07-17 DIAGNOSIS — M4856XG Collapsed vertebra, not elsewhere classified, lumbar region, subsequent encounter for fracture with delayed healing: Secondary | ICD-10-CM | POA: Diagnosis not present

## 2016-07-17 DIAGNOSIS — C649 Malignant neoplasm of unspecified kidney, except renal pelvis: Secondary | ICD-10-CM | POA: Diagnosis not present

## 2016-07-17 DIAGNOSIS — S91302D Unspecified open wound, left foot, subsequent encounter: Secondary | ICD-10-CM | POA: Diagnosis not present

## 2016-07-17 DIAGNOSIS — L97229 Non-pressure chronic ulcer of left calf with unspecified severity: Secondary | ICD-10-CM | POA: Diagnosis not present

## 2016-07-17 DIAGNOSIS — I890 Lymphedema, not elsewhere classified: Secondary | ICD-10-CM | POA: Diagnosis not present

## 2016-07-17 DIAGNOSIS — H401123 Primary open-angle glaucoma, left eye, severe stage: Secondary | ICD-10-CM | POA: Diagnosis not present

## 2016-07-17 DIAGNOSIS — W19XXXA Unspecified fall, initial encounter: Secondary | ICD-10-CM | POA: Diagnosis not present

## 2016-07-17 DIAGNOSIS — R410 Disorientation, unspecified: Secondary | ICD-10-CM | POA: Diagnosis not present

## 2016-07-17 DIAGNOSIS — L97222 Non-pressure chronic ulcer of left calf with fat layer exposed: Secondary | ICD-10-CM | POA: Diagnosis not present

## 2016-07-17 DIAGNOSIS — K529 Noninfective gastroenteritis and colitis, unspecified: Secondary | ICD-10-CM | POA: Diagnosis not present

## 2016-07-17 DIAGNOSIS — R739 Hyperglycemia, unspecified: Secondary | ICD-10-CM | POA: Diagnosis not present

## 2016-07-17 DIAGNOSIS — L97521 Non-pressure chronic ulcer of other part of left foot limited to breakdown of skin: Secondary | ICD-10-CM | POA: Diagnosis not present

## 2016-07-17 DIAGNOSIS — N2889 Other specified disorders of kidney and ureter: Secondary | ICD-10-CM | POA: Diagnosis not present

## 2016-07-17 DIAGNOSIS — S81801A Unspecified open wound, right lower leg, initial encounter: Secondary | ICD-10-CM | POA: Diagnosis not present

## 2016-07-17 DIAGNOSIS — E785 Hyperlipidemia, unspecified: Secondary | ICD-10-CM | POA: Diagnosis not present

## 2016-07-17 DIAGNOSIS — S81811A Laceration without foreign body, right lower leg, initial encounter: Secondary | ICD-10-CM | POA: Diagnosis not present

## 2016-07-17 DIAGNOSIS — I872 Venous insufficiency (chronic) (peripheral): Secondary | ICD-10-CM | POA: Diagnosis not present

## 2016-07-17 DIAGNOSIS — R1312 Dysphagia, oropharyngeal phase: Secondary | ICD-10-CM | POA: Diagnosis not present

## 2016-07-17 DIAGNOSIS — M6281 Muscle weakness (generalized): Secondary | ICD-10-CM | POA: Diagnosis not present

## 2016-07-17 DIAGNOSIS — I10 Essential (primary) hypertension: Secondary | ICD-10-CM | POA: Diagnosis not present

## 2016-07-17 DIAGNOSIS — G629 Polyneuropathy, unspecified: Secondary | ICD-10-CM | POA: Diagnosis not present

## 2016-07-17 DIAGNOSIS — R278 Other lack of coordination: Secondary | ICD-10-CM | POA: Diagnosis not present

## 2016-07-17 DIAGNOSIS — H401114 Primary open-angle glaucoma, right eye, indeterminate stage: Secondary | ICD-10-CM | POA: Diagnosis not present

## 2016-07-17 DIAGNOSIS — S81802A Unspecified open wound, left lower leg, initial encounter: Secondary | ICD-10-CM | POA: Diagnosis not present

## 2016-07-17 DIAGNOSIS — I87312 Chronic venous hypertension (idiopathic) with ulcer of left lower extremity: Secondary | ICD-10-CM | POA: Diagnosis not present

## 2016-07-17 DIAGNOSIS — L97221 Non-pressure chronic ulcer of left calf limited to breakdown of skin: Secondary | ICD-10-CM | POA: Diagnosis not present

## 2016-07-17 DIAGNOSIS — S91102A Unspecified open wound of left great toe without damage to nail, initial encounter: Secondary | ICD-10-CM | POA: Diagnosis not present

## 2016-07-17 DIAGNOSIS — I70242 Atherosclerosis of native arteries of left leg with ulceration of calf: Secondary | ICD-10-CM | POA: Diagnosis not present

## 2016-07-17 DIAGNOSIS — S22009D Unspecified fracture of unspecified thoracic vertebra, subsequent encounter for fracture with routine healing: Secondary | ICD-10-CM | POA: Diagnosis not present

## 2016-07-17 LAB — BASIC METABOLIC PANEL
ANION GAP: 5 (ref 5–15)
BUN: 13 mg/dL (ref 6–20)
CALCIUM: 8.1 mg/dL — AB (ref 8.9–10.3)
CO2: 22 mmol/L (ref 22–32)
CREATININE: 0.72 mg/dL (ref 0.44–1.00)
Chloride: 110 mmol/L (ref 101–111)
Glucose, Bld: 89 mg/dL (ref 65–99)
Potassium: 4.2 mmol/L (ref 3.5–5.1)
SODIUM: 137 mmol/L (ref 135–145)

## 2016-07-17 LAB — CBC
HEMATOCRIT: 29.6 % — AB (ref 36.0–46.0)
Hemoglobin: 10.1 g/dL — ABNORMAL LOW (ref 12.0–15.0)
MCH: 33.3 pg (ref 26.0–34.0)
MCHC: 34.1 g/dL (ref 30.0–36.0)
MCV: 97.7 fL (ref 78.0–100.0)
PLATELETS: 171 10*3/uL (ref 150–400)
RBC: 3.03 MIL/uL — ABNORMAL LOW (ref 3.87–5.11)
RDW: 14.6 % (ref 11.5–15.5)
WBC: 9.5 10*3/uL (ref 4.0–10.5)

## 2016-07-17 MED ORDER — FUROSEMIDE 20 MG PO TABS: 20.0000 mg | ORAL_TABLET | Freq: Every day | ORAL | 0 refills | Status: DC

## 2016-07-17 MED ORDER — NEBIVOLOL HCL 2.5 MG PO TABS
2.5000 mg | ORAL_TABLET | Freq: Every evening | ORAL | Status: DC
  Filled 2016-07-17: qty 1

## 2016-07-17 MED ORDER — METRONIDAZOLE 500 MG PO TABS: 500.0000 mg | ORAL_TABLET | Freq: Three times a day (TID) | ORAL | 0 refills | Status: DC

## 2016-07-17 MED ORDER — AMLODIPINE BESYLATE 5 MG PO TABS
2.5000 mg | ORAL_TABLET | ORAL | Status: DC
  Administered 2016-07-17: 2.5 mg via ORAL
  Filled 2016-07-17: qty 1

## 2016-07-17 MED ORDER — CEFPODOXIME PROXETIL 200 MG PO TABS
400.0000 mg | ORAL_TABLET | Freq: Every day | ORAL | Status: DC
  Administered 2016-07-17: 400 mg via ORAL
  Filled 2016-07-17: qty 2

## 2016-07-17 MED ORDER — VITAMIN D3 10 MCG (400 UNIT) PO TABS: 400.0000 [IU] | ORAL_TABLET | Freq: Every day | ORAL | 0 refills | Status: DC

## 2016-07-17 MED ORDER — CEFPODOXIME PROXETIL 200 MG PO TABS: 400.0000 mg | ORAL_TABLET | Freq: Every day | ORAL | 0 refills | Status: DC

## 2016-07-17 NOTE — Clinical Social Work Note (Signed)
Clinical Social Work Assessment  Patient Details  Name: Joanne Anderson MRN: [id number] Date of Birth: 04-Jan-1921  Date of referral:  07/17/16               Reason for consult:  Facility Placement                Permission sought to share information with:  Family Supports Permission granted to share information::  Yes, Verbal Permission Granted  Name::     grandson Joanne Anderson [phone], sister Joanne Anderson [phone], niece Joanne Anderson [phone]  Agency::     Relationship::     Contact Information:     Housing/Transportation Living arrangements for the past 2 months:  Single Family Home Source of Information:  Patient (grandson, niece) Patient Interpreter Needed:  None Criminal Activity/Legal Involvement Pertinent to Current Situation/Hospitalization:  No - Comment as needed Significant Relationships:  Adult Children, Other Family Members Lives with:  Self Do you feel safe going back to the place where you live?  No Need for family participation in patient care:  Yes (Comment) (pt with dementia, grandson primary decision maker)  Care giving concerns:  Pt from home where she has been living alone. Yolanda Bonine and family members are support system. Grandson states pt ambulates with walker independently at baseline but family assists her with cooking/meals, some ADLs.  PT recommending pt could benefit from rehab and family agrees. Yolanda Bonine states family is also pursuing MCD application bc family wants longterm care options for pt as well. Yolanda Bonine states they had actually been pursuing LTC placement through pt's PCP prior to hospitalization.   Social Worker assessment / plan:  CSW consulted for SNF placement. Met with pt and grandson and niece at bedside. Explained role. Family/Pt agree to SNF referrals. Prefer [facility] as they report pt has been there in the past.  CSW completed FL2. PASSR went to manual review. Made referrals to area SNFs and will follow up with family for bed  offers.   Plan: SNF at DC.  Employment status:  Retired Forensic scientist:  [employer] PT Recommendations:  Research officer, trade union, [place] / Referral to community resources:  [place]  Patient/Family's Response to care:  Appreciative of care  Patient/Family's Understanding of and Emotional Response to Diagnosis, Current Treatment, and Prognosis:  Pt demonstrates some understanding of plan, interaction limited seemingly due to dementia. Grandson and family demonstrate understanding and hope that pt will do well with rehab.   Emotional Assessment Appearance:  Appears stated age Attitude/Demeanor/Rapport:   (appropriate) Affect (typically observed):  Calm, Accepting Orientation:  Oriented to Self, Oriented to Place, Oriented to Situation Alcohol / Substance use:    Psych involvement (Current and /or in the community):  No (Comment)  Discharge Needs  Concerns to be addressed:  Discharge Planning Concerns Readmission within the last 30 days:  No Current discharge risk:  None Barriers to Discharge:  Continued Medical Work up   [employer], LCSW 07/17/2016, 8:29 AM  [phone]

## 2016-07-17 NOTE — Progress Notes (Signed)
LCSW following for discharge planning/disposition:  Pt going to [facility].  Obtained PASSR# [id number].   Patient will transport by [place] completed medical necessity form and will call for transport. Family aware there may be cost associated with [facility] transport.  Family notified regarding discharge- grandson Antony Haste. Going to sign admission paperwork at [facility] 1:15pm. All information sent to facility by [place]. Report number for RN:  [phone] room [id number]  No other needs at this time   Plan: DC to [facility].   Sharren Bridge, MSW, LCSW Clinical Social Work 07/17/2016 [phone]

## 2016-07-17 NOTE — Discharge Instructions (Signed)
Colitis  Colitis is inflammation of the colon. Colitis may last a short time (acute) or it may last a long time (chronic).  What are the causes?  This condition may be caused by:  · Viruses.  · Bacteria.  · Reactions to medicine.  · Certain autoimmune diseases, such as Crohn disease or ulcerative colitis.    What are the signs or symptoms?  Symptoms of this condition include:  · Diarrhea.  · Passing bloody or tarry stool.  · Pain.  · Fever.  · Vomiting.  · Tiredness (fatigue).  · Weight loss.  · Bloating.  · Sudden increase in abdominal pain.  · Having fewer bowel movements than usual.    How is this diagnosed?  This condition is diagnosed with a stool test or a blood test. You may also have other tests, including X-rays, a CT scan, or a colonoscopy.  How is this treated?  Treatment may include:  · Resting the bowel. This involves not eating or drinking for a period of time.  · Fluids that are given through an IV tube.  · Medicine for pain and diarrhea.  · Antibiotic medicines.  · Cortisone medicines.  · Surgery.    Follow these instructions at home:  Eating and drinking   · Follow instructions from your health care provider about eating or drinking restrictions.  · Drink enough fluid to keep your urine clear or pale yellow.  · Work with a dietitian to determine which foods cause your condition to flare up.  · Avoid foods that cause flare-ups.  · Eat a well-balanced diet.  Medicines   · Take over-the-counter and prescription medicines only as told by your health care provider.  · If you were prescribed an antibiotic medicine, take it as told by your health care provider. Do not stop taking the antibiotic even if you start to feel better.  General instructions   · Keep all follow-up visits as told by your health care provider. This is important.  Contact a health care provider if:  · Your symptoms do not go away.  · You develop new symptoms.  Get help right away if:  · You have a fever that does not go away with  treatment.  · You develop chills.  · You have extreme weakness, fainting, or dehydration.  · You have repeated vomiting.  · You develop severe pain in your abdomen.  · You pass bloody or tarry stool.  This information is not intended to replace advice given to you by your health care provider. Make sure you discuss any questions you have with your health care provider.  Document Released: 03/26/2004 Document Revised: 07/25/2015 Document Reviewed: 06/11/2014  Elsevier Interactive Patient Education © 2017 Elsevier Inc.

## 2016-07-17 NOTE — Discharge Summary (Signed)
Physician Discharge Summary  Joanne Anderson NID:[id number] DOB: [birth year] DOA: 07/14/2016  PCP: Joanne Gravel, MD  Admit date: 07/14/2016 Discharge date: 07/17/2016  Time spent: 45 minutes  Recommendations for Outpatient Follow-up:  Patient will be discharged to skilled nursing facility. Continue physical and occupational therapy.  Patient will need to follow up with primary care provider within one week of discharge, discuss blood pressure management. Continue outpatient wound care. Patient should continue medications as prescribed.  Patient should follow a soft diet.   Discharge Diagnoses:  Colitis with vomiting Dementia  Essential Hypertension Stasis Dermatitis/Lower extremity wounds Hyperglycemia  Solid left renal mass/neoplasm Multiple compression fractures Hyperlipidemia Acute kidney injury  Discharge Condition: stable  Diet recommendation: Soft  Filed Weights   07/14/16 1156 07/14/16 2055  Weight: 36.3 kg (80 lb) 38.4 kg (84 lb 10.5 oz)    History of present illness:  on 07/14/2016 by Dr. Clyde Lundborg Rileyis a 83 y.o.femalewith medical history significant of stasis dermatitis of the legs; glaucoma and macular degeneration; HTN; HLD; and mild dementia presenting with abdominal pain. She reports, "Well honey, I don't think I am gonna make it. I can't make it through the night, who can come to bring me a drink of water?" Other history was obtained via the niece and grandson. The patient had a scheduled doctor's appointment today. When the grandson got there, she was dressed and ready but complained that she needed to go to the bathroom. She was weak when she came out and started vomiting and vomited several times in a row. He called her doctor's office and they suggested bringing her in to the ER. Since here, very loose BMs x 2-3 in the ER. No further emesis. LLQ abdominal pain. Also with LE edema, legs are wrapped, "heavy"and she reports that shecan't lift  them.  Hospital Course:  Colitis with vomiting -Patient presented abdominal pain and vomiting -Denies further vomiting or abdominal pain since admission -CT abdomen pelvis showed colitis syndrome the splenic flexure -was initially placed on ciprofloxacin and Flagyl.  -Has tolerated soft diet -Will discharge patient on vantin and flagyl  Dementia  -Patient does have some sundowning   Essential Hypertension -Norvasc, Altace, bystolic, lasix held due to soft BP -BP has improved, patient may restart bystolic and amlodipine upon discharge  -Would discuss the other medications with PCP  Stasis Dermatitis/Lower extremity wounds -Wound care consulted, Aquacel and 3 layers of profore applied  -Wound care spoke with Grandson, and will continue outpatient follow up with wound care  Hyperglycemia  -Possibly stress response vs chronic home steroids (prednisone) -resolved  Solid left renal mass/neoplasm -Noted on CT scan of abdomen and pelvis, with minimal growth since 2013  Multiple compression fractures -CT showed compression fractures of T8, T9, T10, T12, L1, L5 -Patient does have a known L5 fracture since 2013. -Discussed with grandson, at that time patient was not found to be a surgical candidate. -Vitamin D (-25) level 24.8, continue Vit D supplementation  Hypokalemia -resolved  Hyperlipidemia -Continue statin  Acute kidney injury -Resolved -presented with creatinine 1.33, likely secondary to GI losses/dehydration -Creatinine currently 0.72  Code status: DNR  Procedures: None  Consultations: None  Discharge Exam: Vitals:   07/16/16 2040 07/17/16 0730  BP: 109/60 (!) 145/96  Pulse: 75 87  Resp: 20 20  Temp: 98.1 F (36.7 C) 98.1 F (36.7 C)   Patient has dementia. Patient states she is feeling better today. Has no complaints of abdominal pain, chest pain, shortness of breath,  dizziness, headache.    General: Well developed, elderly, no apparent  distress  HEENT: NCAT, mucous membranes moist.  Cardiovascular: S1 S2 auscultated, 1/6SEM, RRR  Respiratory: Clear to auscultation bilaterally  Abdomen: Soft, nontender, nondistended, + bowel sounds  Extremities: warm dry without cyanosis clubbing. Lower extremities wrapped  Neuro: AAOx2 (self, situation), nonfocal  Psych: Appropriate, pleasant  Discharge Instructions Discharge Instructions    Discharge instructions    Complete by:  As directed    Patient will be discharged to skilled nursing facility. Continue physical and occupational therapy.  Patient will need to follow up with primary care provider within one week of discharge, discuss blood pressure management. Continue outpatient wound care. Patient should continue medications as prescribed.  Patient should follow a soft diet.     Current Discharge Medication List    START taking these medications   Details  cefpodoxime (VANTIN) 200 MG tablet Take 2 tablets (400 mg total) by mouth daily. Qty: 10 tablet, Refills: 0    cholecalciferol 400 units tablet Take 1 tablet (400 Units total) by mouth daily. Qty: 30 each, Refills: 0    metroNIDAZOLE (FLAGYL) 500 MG tablet Take 1 tablet (500 mg total) by mouth 3 (three) times daily. Qty: 15 tablet, Refills: 0      CONTINUE these medications which have NOT CHANGED   Details  acetaminophen (TYLENOL) 500 MG tablet Take 500 mg by mouth every 6 (six) hours as needed for mild pain, moderate pain, fever or headache.    amLODipine (NORVASC) 2.5 MG tablet Take 2.5 mg by mouth every Monday, Wednesday, and Friday.    brimonidine (ALPHAGAN) 0.2 % ophthalmic solution Place 1 drop into both eyes 3 (three) times daily.     dorzolamide-timolol (COSOPT) 22.3-6.8 MG/ML ophthalmic solution Place 1 drop into both eyes 2 (two) times daily.    fluvoxaMINE (LUVOX) 25 MG tablet Take 25 mg by mouth at bedtime.    latanoprost (XALATAN) 0.005 % ophthalmic solution Place 1 drop into both eyes at  bedtime.     levothyroxine (SYNTHROID, LEVOTHROID) 25 MCG tablet Take 12.5 mcg by mouth every Saturday.     lovastatin (MEVACOR) 10 MG tablet Take 5 mg by mouth every evening.  Refills: 1    Melatonin 10 MG TABS Take 5 mg by mouth at bedtime.    nebivolol (BYSTOLIC) 10 MG tablet Take 2.5 mg by mouth every evening.    pilocarpine (PILOCAR) 4 % ophthalmic solution Place 1 drop into the left eye 4 (four) times daily.     predniSONE (DELTASONE) 5 MG tablet Take 5 mg by mouth daily with breakfast.  Refills: 0      STOP taking these medications     ramipril (ALTACE) 5 MG capsule      furosemide (LASIX) 20 MG tablet        Allergies  Allergen Reactions  . Codeine Anaphylaxis  . Nsaids Nausea And Vomiting  . Gabapentin Nausea And Vomiting  . Other Nausea And Vomiting and Other (See Comments)    Pt states that she is allergic to all pain medications.     Contact information for follow-up providers    Joanne Gravel, MD. Schedule an appointment as soon as possible for a visit in 1 week(s).   Specialty:  Internal Medicine Why:  Hospital follow up Contact information: [address] [place] [address] [phone]            Contact information for after-discharge care    Destination  [facility] SNF Follow up.   Specialty:  [place] information: [address] [place] [phone]                   The results of significant diagnostics from this hospitalization (including imaging, microbiology, ancillary and laboratory) are listed below for reference.    Significant Diagnostic Studies: Ct Abdomen Pelvis W Contrast  Result Date: 07/14/2016 CLINICAL DATA:  Left lower quadrant pain EXAM: CT ABDOMEN AND PELVIS WITH CONTRAST TECHNIQUE: Multidetector CT imaging of the abdomen and pelvis was performed using the standard protocol following bolus administration of intravenous  contrast. CONTRAST:  26mL ISOVUE-300 IOPAMIDOL (ISOVUE-300) INJECTION 61% COMPARISON:  01/22/2012 FINDINGS: Lower chest: Sliding hiatal hernia, moderate size. Mild atelectasis at the bases. Hepatobiliary: No focal liver abnormality.No evidence of biliary obstruction or stone. Pancreas: Unremarkable. Spleen: Unremarkable. Adrenals/Urinary Tract: Negative adrenals. Solid left renal mass, interpolar and extending into the hilum, 26 x 34 mm, slightly larger than in 2013. No adenopathy or vascular invasive feature. Unremarkable bladder. Stomach/Bowel: There is a segment of colonic wall thickening from submucosal edema, mainly from the distal transverse to sigmoid colon. The colon is diffusely fluid-filled. Colonic diverticulosis without active inflammation. Abrupt transition from decompressed thickened colon to distended sigmoid colon, without discrete mass lesion noted. There is extensive atherosclerosis of the aorta and branch vessels. Quantification of ostial stenosis is not possible on this study. No major branch occlusion is noted. The IMA is patent. Negative for small bowel obstruction. Vascular/Lymphatic: Diffuse atherosclerotic calcification as noted above. No acute vascular finding. No mass or adenopathy. Reproductive:Hysterectomy.  Negative adnexae. Other: Left inguinal hernia containing nonobstructed or inflamed small bowel. There is a right groin hernia containing small bowel, possibly a postoperative hernia given neighboring clips. Additional hernia in the right groin containing ascitic density material. Hernias are progressed from prior. Musculoskeletal: Compression fractures of T8, T9, T10, T12, L1, L5. The lumbar spine was seen previously from L5-T11, and only the L5 fracture was present at that time. Height loss is advanced at although fractures except at T9, where there is a visible fracture plane and sclerosis, and L5. No retropulsion. Exaggerated thoracolumbar curvature. IMPRESSION: 1. Colitis  centered on the splenic flexure; infectious or ischemic colitis could give this appearance. There is extensive atherosclerosis without major branch occlusion. 2. Known solid left renal mass/neoplasm with minimal growth since 2013, 26 x 34 mm today. 3. Compression fractures of T8, T9, T10, T12, L1, and L5. Height loss is advanced at multiple levels. The T9 fracture appears subacute and unhealed. 4. Bilateral groin hernias containing nonobstructed small bowel. Electronically Signed   By: Monte Fantasia M.D.   On: 07/14/2016 15:23    Microbiology: No results found for this or any previous visit (from the past 240 hour(s)).   Labs: Basic Metabolic Panel:  Recent Labs Lab 07/14/16 1208 07/15/16 0330 07/16/16 0326 07/17/16 0334  NA 137 138 136 137  K 4.1 3.5 3.2* 4.2  CL 101 108 108 110  CO2 26 22 21* 22  GLUCOSE 154* 91 85 89  BUN 29* 24* 20 13  CREATININE 1.33* 0.92 0.78 0.72  CALCIUM 9.7 8.1* 8.3* 8.1*   Liver Function Tests:  Recent Labs Lab 07/14/16 1208  AST 26  ALT 17  ALKPHOS 170*  BILITOT 0.8  PROT 7.6  ALBUMIN 3.8    Recent Labs Lab 07/14/16 1208  LIPASE 57*   No results for input(s): AMMONIA in the last 168 hours. CBC:  Recent Labs Lab 07/14/16 1208 07/15/16 0330 07/16/16 0326 07/17/16 0334  WBC 15.4* 14.0* 14.3* 9.5  HGB 12.4 10.6* 10.8* 10.1*  HCT 37.7 31.8* 32.5* 29.6*  MCV 98.2 96.4 97.3 97.7  PLT 251 202 181 171   Cardiac Enzymes: No results for input(s): CKTOTAL, CKMB, CKMBINDEX, TROPONINI in the last 168 hours. BNP: BNP (last 3 results) No results for input(s): BNP in the last 8760 hours.  ProBNP (last 3 results) No results for input(s): PROBNP in the last 8760 hours.  CBG: No results for input(s): GLUCAP in the last 168 hours.     SignedMARRIE, CHANDRA  [facility] 07/17/2016, 11:54 AM

## 2016-07-19 DIAGNOSIS — I872 Venous insufficiency (chronic) (peripheral): Secondary | ICD-10-CM | POA: Diagnosis not present

## 2016-07-19 DIAGNOSIS — N179 Acute kidney failure, unspecified: Secondary | ICD-10-CM | POA: Diagnosis not present

## 2016-07-19 DIAGNOSIS — R739 Hyperglycemia, unspecified: Secondary | ICD-10-CM | POA: Diagnosis not present

## 2016-07-19 DIAGNOSIS — K529 Noninfective gastroenteritis and colitis, unspecified: Secondary | ICD-10-CM | POA: Diagnosis not present

## 2016-07-19 DIAGNOSIS — F0390 Unspecified dementia without behavioral disturbance: Secondary | ICD-10-CM | POA: Diagnosis not present

## 2016-07-19 DIAGNOSIS — N2889 Other specified disorders of kidney and ureter: Secondary | ICD-10-CM | POA: Diagnosis not present

## 2016-07-19 DIAGNOSIS — I10 Essential (primary) hypertension: Secondary | ICD-10-CM | POA: Diagnosis not present

## 2016-07-20 DIAGNOSIS — K529 Noninfective gastroenteritis and colitis, unspecified: Secondary | ICD-10-CM | POA: Diagnosis not present

## 2016-07-20 DIAGNOSIS — F0391 Unspecified dementia with behavioral disturbance: Secondary | ICD-10-CM | POA: Diagnosis not present

## 2016-07-20 DIAGNOSIS — I872 Venous insufficiency (chronic) (peripheral): Secondary | ICD-10-CM | POA: Diagnosis not present

## 2016-07-20 DIAGNOSIS — I10 Essential (primary) hypertension: Secondary | ICD-10-CM | POA: Diagnosis not present

## 2016-07-21 ENCOUNTER — Other Ambulatory Visit: Payer: Self-pay | Admitting: *Deleted

## 2016-07-21 NOTE — Patient Outreach (Signed)
  [facility]) Care Management [place] Coordination  07/21/2016  TAMETRIA AHO December 15, 1920 [id number]   Met with Judi Saa for Ritta Slot. Reviewed patient case. He Reports that patient will remain a [place] resident of facility and there are no plans to discharge at this time.  RNCM will sign off case.  Royetta Crochet. Laymond Purser, RN, BSN, [place] [facility] Care Coordinator [phone]

## 2016-07-24 DIAGNOSIS — L97521 Non-pressure chronic ulcer of other part of left foot limited to breakdown of skin: Secondary | ICD-10-CM | POA: Diagnosis not present

## 2016-07-24 DIAGNOSIS — L97222 Non-pressure chronic ulcer of left calf with fat layer exposed: Secondary | ICD-10-CM | POA: Diagnosis not present

## 2016-07-24 DIAGNOSIS — F0390 Unspecified dementia without behavioral disturbance: Secondary | ICD-10-CM | POA: Diagnosis not present

## 2016-07-24 DIAGNOSIS — I87333 Chronic venous hypertension (idiopathic) with ulcer and inflammation of bilateral lower extremity: Secondary | ICD-10-CM | POA: Diagnosis not present

## 2016-07-24 DIAGNOSIS — S81802A Unspecified open wound, left lower leg, initial encounter: Secondary | ICD-10-CM | POA: Diagnosis not present

## 2016-07-24 DIAGNOSIS — I70242 Atherosclerosis of native arteries of left leg with ulceration of calf: Secondary | ICD-10-CM | POA: Diagnosis not present

## 2016-07-24 DIAGNOSIS — I87312 Chronic venous hypertension (idiopathic) with ulcer of left lower extremity: Secondary | ICD-10-CM | POA: Diagnosis not present

## 2016-07-24 DIAGNOSIS — L97211 Non-pressure chronic ulcer of right calf limited to breakdown of skin: Secondary | ICD-10-CM | POA: Diagnosis not present

## 2016-07-24 DIAGNOSIS — L97221 Non-pressure chronic ulcer of left calf limited to breakdown of skin: Secondary | ICD-10-CM | POA: Diagnosis not present

## 2016-07-24 DIAGNOSIS — S91102A Unspecified open wound of left great toe without damage to nail, initial encounter: Secondary | ICD-10-CM | POA: Diagnosis not present

## 2016-07-24 DIAGNOSIS — I10 Essential (primary) hypertension: Secondary | ICD-10-CM | POA: Diagnosis not present

## 2016-07-24 DIAGNOSIS — S81811A Laceration without foreign body, right lower leg, initial encounter: Secondary | ICD-10-CM | POA: Diagnosis not present

## 2016-07-28 DIAGNOSIS — I10 Essential (primary) hypertension: Secondary | ICD-10-CM | POA: Diagnosis not present

## 2016-07-28 DIAGNOSIS — I872 Venous insufficiency (chronic) (peripheral): Secondary | ICD-10-CM | POA: Diagnosis not present

## 2016-07-28 DIAGNOSIS — M4856XG Collapsed vertebra, not elsewhere classified, lumbar region, subsequent encounter for fracture with delayed healing: Secondary | ICD-10-CM | POA: Diagnosis not present

## 2016-07-28 DIAGNOSIS — K529 Noninfective gastroenteritis and colitis, unspecified: Secondary | ICD-10-CM | POA: Diagnosis not present

## 2016-08-04 DIAGNOSIS — K529 Noninfective gastroenteritis and colitis, unspecified: Secondary | ICD-10-CM | POA: Diagnosis not present

## 2016-08-04 DIAGNOSIS — M4856XG Collapsed vertebra, not elsewhere classified, lumbar region, subsequent encounter for fracture with delayed healing: Secondary | ICD-10-CM | POA: Diagnosis not present

## 2016-08-04 DIAGNOSIS — I10 Essential (primary) hypertension: Secondary | ICD-10-CM | POA: Diagnosis not present

## 2016-08-04 DIAGNOSIS — I872 Venous insufficiency (chronic) (peripheral): Secondary | ICD-10-CM | POA: Diagnosis not present

## 2016-08-06 ENCOUNTER — Encounter (HOSPITAL_BASED_OUTPATIENT_CLINIC_OR_DEPARTMENT_OTHER): Payer: No Typology Code available for payment source | Attending: Internal Medicine

## 2016-08-06 DIAGNOSIS — I890 Lymphedema, not elsewhere classified: Secondary | ICD-10-CM | POA: Diagnosis not present

## 2016-08-06 DIAGNOSIS — I87333 Chronic venous hypertension (idiopathic) with ulcer and inflammation of bilateral lower extremity: Secondary | ICD-10-CM | POA: Diagnosis not present

## 2016-08-06 DIAGNOSIS — I10 Essential (primary) hypertension: Secondary | ICD-10-CM | POA: Insufficient documentation

## 2016-08-06 DIAGNOSIS — L97812 Non-pressure chronic ulcer of other part of right lower leg with fat layer exposed: Secondary | ICD-10-CM | POA: Diagnosis not present

## 2016-08-06 DIAGNOSIS — L97822 Non-pressure chronic ulcer of other part of left lower leg with fat layer exposed: Secondary | ICD-10-CM | POA: Diagnosis not present

## 2016-08-06 DIAGNOSIS — S81801A Unspecified open wound, right lower leg, initial encounter: Secondary | ICD-10-CM | POA: Diagnosis not present

## 2016-08-06 DIAGNOSIS — F0390 Unspecified dementia without behavioral disturbance: Secondary | ICD-10-CM | POA: Insufficient documentation

## 2016-08-06 DIAGNOSIS — I872 Venous insufficiency (chronic) (peripheral): Secondary | ICD-10-CM | POA: Diagnosis not present

## 2016-08-06 DIAGNOSIS — L97229 Non-pressure chronic ulcer of left calf with unspecified severity: Secondary | ICD-10-CM | POA: Diagnosis not present

## 2016-08-06 DIAGNOSIS — S81802A Unspecified open wound, left lower leg, initial encounter: Secondary | ICD-10-CM | POA: Diagnosis not present

## 2016-08-06 DIAGNOSIS — S91302D Unspecified open wound, left foot, subsequent encounter: Secondary | ICD-10-CM | POA: Diagnosis not present

## 2016-08-10 DIAGNOSIS — H401123 Primary open-angle glaucoma, left eye, severe stage: Secondary | ICD-10-CM | POA: Diagnosis not present

## 2016-08-10 DIAGNOSIS — H401114 Primary open-angle glaucoma, right eye, indeterminate stage: Secondary | ICD-10-CM | POA: Diagnosis not present

## 2016-08-10 DIAGNOSIS — H353231 Exudative age-related macular degeneration, bilateral, with active choroidal neovascularization: Secondary | ICD-10-CM | POA: Diagnosis not present

## 2016-08-13 DIAGNOSIS — I872 Venous insufficiency (chronic) (peripheral): Secondary | ICD-10-CM | POA: Diagnosis not present

## 2016-08-13 DIAGNOSIS — M4856XG Collapsed vertebra, not elsewhere classified, lumbar region, subsequent encounter for fracture with delayed healing: Secondary | ICD-10-CM | POA: Diagnosis not present

## 2016-08-13 DIAGNOSIS — K529 Noninfective gastroenteritis and colitis, unspecified: Secondary | ICD-10-CM | POA: Diagnosis not present

## 2016-08-13 DIAGNOSIS — R634 Abnormal weight loss: Secondary | ICD-10-CM | POA: Diagnosis not present

## 2016-08-17 DIAGNOSIS — W19XXXA Unspecified fall, initial encounter: Secondary | ICD-10-CM | POA: Diagnosis not present

## 2016-08-17 DIAGNOSIS — B372 Candidiasis of skin and nail: Secondary | ICD-10-CM | POA: Diagnosis not present

## 2016-08-17 DIAGNOSIS — K529 Noninfective gastroenteritis and colitis, unspecified: Secondary | ICD-10-CM | POA: Diagnosis not present

## 2016-08-17 DIAGNOSIS — R410 Disorientation, unspecified: Secondary | ICD-10-CM | POA: Diagnosis not present

## 2016-08-20 DIAGNOSIS — C649 Malignant neoplasm of unspecified kidney, except renal pelvis: Secondary | ICD-10-CM | POA: Diagnosis not present

## 2016-08-20 DIAGNOSIS — R410 Disorientation, unspecified: Secondary | ICD-10-CM | POA: Diagnosis not present

## 2016-08-20 DIAGNOSIS — R1312 Dysphagia, oropharyngeal phase: Secondary | ICD-10-CM | POA: Diagnosis not present

## 2016-08-20 DIAGNOSIS — N390 Urinary tract infection, site not specified: Secondary | ICD-10-CM | POA: Diagnosis not present

## 2016-08-20 DIAGNOSIS — R634 Abnormal weight loss: Secondary | ICD-10-CM | POA: Diagnosis not present

## 2016-08-20 DIAGNOSIS — E785 Hyperlipidemia, unspecified: Secondary | ICD-10-CM | POA: Diagnosis not present

## 2016-08-20 DIAGNOSIS — F0391 Unspecified dementia with behavioral disturbance: Secondary | ICD-10-CM | POA: Diagnosis not present

## 2016-08-20 DIAGNOSIS — R2689 Other abnormalities of gait and mobility: Secondary | ICD-10-CM | POA: Diagnosis not present

## 2016-08-20 DIAGNOSIS — M6281 Muscle weakness (generalized): Secondary | ICD-10-CM | POA: Diagnosis not present

## 2016-08-20 DIAGNOSIS — S22009D Unspecified fracture of unspecified thoracic vertebra, subsequent encounter for fracture with routine healing: Secondary | ICD-10-CM | POA: Diagnosis not present

## 2016-08-20 DIAGNOSIS — E039 Hypothyroidism, unspecified: Secondary | ICD-10-CM | POA: Diagnosis not present

## 2016-08-20 DIAGNOSIS — R278 Other lack of coordination: Secondary | ICD-10-CM | POA: Diagnosis not present

## 2016-08-20 DIAGNOSIS — I10 Essential (primary) hypertension: Secondary | ICD-10-CM | POA: Diagnosis not present

## 2016-08-20 DIAGNOSIS — K529 Noninfective gastroenteritis and colitis, unspecified: Secondary | ICD-10-CM | POA: Diagnosis not present

## 2016-08-21 DIAGNOSIS — R410 Disorientation, unspecified: Secondary | ICD-10-CM | POA: Diagnosis not present

## 2016-08-21 DIAGNOSIS — R278 Other lack of coordination: Secondary | ICD-10-CM | POA: Diagnosis not present

## 2016-08-21 DIAGNOSIS — F0390 Unspecified dementia without behavioral disturbance: Secondary | ICD-10-CM | POA: Diagnosis not present

## 2016-08-21 DIAGNOSIS — R1312 Dysphagia, oropharyngeal phase: Secondary | ICD-10-CM | POA: Diagnosis not present

## 2016-08-21 DIAGNOSIS — M6281 Muscle weakness (generalized): Secondary | ICD-10-CM | POA: Diagnosis not present

## 2016-08-21 DIAGNOSIS — N390 Urinary tract infection, site not specified: Secondary | ICD-10-CM | POA: Diagnosis not present

## 2016-08-21 DIAGNOSIS — R2689 Other abnormalities of gait and mobility: Secondary | ICD-10-CM | POA: Diagnosis not present

## 2016-08-21 DIAGNOSIS — D649 Anemia, unspecified: Secondary | ICD-10-CM | POA: Diagnosis not present

## 2016-08-21 DIAGNOSIS — K529 Noninfective gastroenteritis and colitis, unspecified: Secondary | ICD-10-CM | POA: Diagnosis not present

## 2016-08-21 DIAGNOSIS — I10 Essential (primary) hypertension: Secondary | ICD-10-CM | POA: Diagnosis not present

## 2016-08-22 DIAGNOSIS — R278 Other lack of coordination: Secondary | ICD-10-CM | POA: Diagnosis not present

## 2016-08-22 DIAGNOSIS — K529 Noninfective gastroenteritis and colitis, unspecified: Secondary | ICD-10-CM | POA: Diagnosis not present

## 2016-08-22 DIAGNOSIS — R2689 Other abnormalities of gait and mobility: Secondary | ICD-10-CM | POA: Diagnosis not present

## 2016-08-22 DIAGNOSIS — R1312 Dysphagia, oropharyngeal phase: Secondary | ICD-10-CM | POA: Diagnosis not present

## 2016-08-22 DIAGNOSIS — M6281 Muscle weakness (generalized): Secondary | ICD-10-CM | POA: Diagnosis not present

## 2016-08-22 DIAGNOSIS — I10 Essential (primary) hypertension: Secondary | ICD-10-CM | POA: Diagnosis not present

## 2016-08-24 DIAGNOSIS — L97229 Non-pressure chronic ulcer of left calf with unspecified severity: Secondary | ICD-10-CM | POA: Diagnosis not present

## 2016-08-24 DIAGNOSIS — I87333 Chronic venous hypertension (idiopathic) with ulcer and inflammation of bilateral lower extremity: Secondary | ICD-10-CM | POA: Diagnosis not present

## 2016-08-24 DIAGNOSIS — L97822 Non-pressure chronic ulcer of other part of left lower leg with fat layer exposed: Secondary | ICD-10-CM | POA: Diagnosis not present

## 2016-08-24 DIAGNOSIS — S81802A Unspecified open wound, left lower leg, initial encounter: Secondary | ICD-10-CM | POA: Diagnosis not present

## 2016-08-24 DIAGNOSIS — I10 Essential (primary) hypertension: Secondary | ICD-10-CM | POA: Diagnosis not present

## 2016-08-24 DIAGNOSIS — L97812 Non-pressure chronic ulcer of other part of right lower leg with fat layer exposed: Secondary | ICD-10-CM | POA: Diagnosis not present

## 2016-08-24 DIAGNOSIS — F0390 Unspecified dementia without behavioral disturbance: Secondary | ICD-10-CM | POA: Diagnosis not present

## 2016-08-24 DIAGNOSIS — S81801A Unspecified open wound, right lower leg, initial encounter: Secondary | ICD-10-CM | POA: Diagnosis not present

## 2016-08-26 DIAGNOSIS — R634 Abnormal weight loss: Secondary | ICD-10-CM | POA: Diagnosis not present

## 2016-08-26 DIAGNOSIS — N390 Urinary tract infection, site not specified: Secondary | ICD-10-CM | POA: Diagnosis not present

## 2016-08-26 DIAGNOSIS — K529 Noninfective gastroenteritis and colitis, unspecified: Secondary | ICD-10-CM | POA: Diagnosis not present

## 2016-08-26 DIAGNOSIS — R410 Disorientation, unspecified: Secondary | ICD-10-CM | POA: Diagnosis not present

## 2016-08-27 DIAGNOSIS — Z961 Presence of intraocular lens: Secondary | ICD-10-CM | POA: Diagnosis not present

## 2016-08-27 DIAGNOSIS — H401114 Primary open-angle glaucoma, right eye, indeterminate stage: Secondary | ICD-10-CM | POA: Diagnosis not present

## 2016-08-27 DIAGNOSIS — H401123 Primary open-angle glaucoma, left eye, severe stage: Secondary | ICD-10-CM | POA: Diagnosis not present

## 2016-08-27 DIAGNOSIS — H353231 Exudative age-related macular degeneration, bilateral, with active choroidal neovascularization: Secondary | ICD-10-CM | POA: Diagnosis not present

## 2016-09-04 DIAGNOSIS — F29 Unspecified psychosis not due to a substance or known physiological condition: Secondary | ICD-10-CM | POA: Diagnosis not present

## 2016-09-04 DIAGNOSIS — I872 Venous insufficiency (chronic) (peripheral): Secondary | ICD-10-CM | POA: Diagnosis not present

## 2016-09-04 DIAGNOSIS — E46 Unspecified protein-calorie malnutrition: Secondary | ICD-10-CM | POA: Diagnosis not present

## 2016-09-04 DIAGNOSIS — F0391 Unspecified dementia with behavioral disturbance: Secondary | ICD-10-CM | POA: Diagnosis not present

## 2016-09-04 DIAGNOSIS — R600 Localized edema: Secondary | ICD-10-CM | POA: Diagnosis not present

## 2016-09-04 DIAGNOSIS — I10 Essential (primary) hypertension: Secondary | ICD-10-CM | POA: Diagnosis not present

## 2016-09-09 DIAGNOSIS — I872 Venous insufficiency (chronic) (peripheral): Secondary | ICD-10-CM | POA: Diagnosis not present

## 2016-09-09 DIAGNOSIS — F0391 Unspecified dementia with behavioral disturbance: Secondary | ICD-10-CM | POA: Diagnosis not present

## 2016-09-09 DIAGNOSIS — R634 Abnormal weight loss: Secondary | ICD-10-CM | POA: Diagnosis not present

## 2016-09-09 DIAGNOSIS — H3530 Unspecified macular degeneration: Secondary | ICD-10-CM | POA: Diagnosis not present

## 2016-09-10 ENCOUNTER — Encounter (HOSPITAL_BASED_OUTPATIENT_CLINIC_OR_DEPARTMENT_OTHER): Payer: Medicare Other | Attending: Internal Medicine

## 2016-09-10 DIAGNOSIS — S81802A Unspecified open wound, left lower leg, initial encounter: Secondary | ICD-10-CM | POA: Diagnosis not present

## 2016-09-10 DIAGNOSIS — L97819 Non-pressure chronic ulcer of other part of right lower leg with unspecified severity: Secondary | ICD-10-CM | POA: Insufficient documentation

## 2016-09-10 DIAGNOSIS — F0390 Unspecified dementia without behavioral disturbance: Secondary | ICD-10-CM | POA: Insufficient documentation

## 2016-09-10 DIAGNOSIS — I87333 Chronic venous hypertension (idiopathic) with ulcer and inflammation of bilateral lower extremity: Secondary | ICD-10-CM | POA: Insufficient documentation

## 2016-09-10 DIAGNOSIS — I10 Essential (primary) hypertension: Secondary | ICD-10-CM | POA: Insufficient documentation

## 2016-09-10 DIAGNOSIS — L97229 Non-pressure chronic ulcer of left calf with unspecified severity: Secondary | ICD-10-CM | POA: Diagnosis not present

## 2016-09-10 DIAGNOSIS — S81811A Laceration without foreign body, right lower leg, initial encounter: Secondary | ICD-10-CM | POA: Diagnosis not present

## 2016-09-11 DIAGNOSIS — I10 Essential (primary) hypertension: Secondary | ICD-10-CM | POA: Diagnosis not present

## 2016-09-11 DIAGNOSIS — D649 Anemia, unspecified: Secondary | ICD-10-CM | POA: Diagnosis not present

## 2016-09-14 DIAGNOSIS — H401123 Primary open-angle glaucoma, left eye, severe stage: Secondary | ICD-10-CM | POA: Diagnosis not present

## 2016-09-14 DIAGNOSIS — H401114 Primary open-angle glaucoma, right eye, indeterminate stage: Secondary | ICD-10-CM | POA: Diagnosis not present

## 2016-09-17 DIAGNOSIS — E039 Hypothyroidism, unspecified: Secondary | ICD-10-CM | POA: Diagnosis not present

## 2016-09-17 DIAGNOSIS — C649 Malignant neoplasm of unspecified kidney, except renal pelvis: Secondary | ICD-10-CM | POA: Diagnosis not present

## 2016-09-17 DIAGNOSIS — S22009D Unspecified fracture of unspecified thoracic vertebra, subsequent encounter for fracture with routine healing: Secondary | ICD-10-CM | POA: Diagnosis not present

## 2016-09-17 DIAGNOSIS — F0391 Unspecified dementia with behavioral disturbance: Secondary | ICD-10-CM | POA: Diagnosis not present

## 2016-09-17 DIAGNOSIS — R2689 Other abnormalities of gait and mobility: Secondary | ICD-10-CM | POA: Diagnosis not present

## 2016-09-17 DIAGNOSIS — E785 Hyperlipidemia, unspecified: Secondary | ICD-10-CM | POA: Diagnosis not present

## 2016-09-17 DIAGNOSIS — K529 Noninfective gastroenteritis and colitis, unspecified: Secondary | ICD-10-CM | POA: Diagnosis not present

## 2016-09-17 DIAGNOSIS — R41841 Cognitive communication deficit: Secondary | ICD-10-CM | POA: Diagnosis not present

## 2016-09-17 DIAGNOSIS — R1312 Dysphagia, oropharyngeal phase: Secondary | ICD-10-CM | POA: Diagnosis not present

## 2016-09-17 DIAGNOSIS — I10 Essential (primary) hypertension: Secondary | ICD-10-CM | POA: Diagnosis not present

## 2016-09-17 DIAGNOSIS — R278 Other lack of coordination: Secondary | ICD-10-CM | POA: Diagnosis not present

## 2016-09-17 DIAGNOSIS — M6281 Muscle weakness (generalized): Secondary | ICD-10-CM | POA: Diagnosis not present

## 2016-09-18 DIAGNOSIS — R2689 Other abnormalities of gait and mobility: Secondary | ICD-10-CM | POA: Diagnosis not present

## 2016-09-18 DIAGNOSIS — R41841 Cognitive communication deficit: Secondary | ICD-10-CM | POA: Diagnosis not present

## 2016-09-18 DIAGNOSIS — R278 Other lack of coordination: Secondary | ICD-10-CM | POA: Diagnosis not present

## 2016-09-18 DIAGNOSIS — K529 Noninfective gastroenteritis and colitis, unspecified: Secondary | ICD-10-CM | POA: Diagnosis not present

## 2016-09-18 DIAGNOSIS — M6281 Muscle weakness (generalized): Secondary | ICD-10-CM | POA: Diagnosis not present

## 2016-09-18 DIAGNOSIS — R1312 Dysphagia, oropharyngeal phase: Secondary | ICD-10-CM | POA: Diagnosis not present

## 2016-09-21 DIAGNOSIS — R1312 Dysphagia, oropharyngeal phase: Secondary | ICD-10-CM | POA: Diagnosis not present

## 2016-09-21 DIAGNOSIS — I872 Venous insufficiency (chronic) (peripheral): Secondary | ICD-10-CM | POA: Diagnosis not present

## 2016-09-21 DIAGNOSIS — R2689 Other abnormalities of gait and mobility: Secondary | ICD-10-CM | POA: Diagnosis not present

## 2016-09-21 DIAGNOSIS — R410 Disorientation, unspecified: Secondary | ICD-10-CM | POA: Diagnosis not present

## 2016-09-21 DIAGNOSIS — K529 Noninfective gastroenteritis and colitis, unspecified: Secondary | ICD-10-CM | POA: Diagnosis not present

## 2016-09-21 DIAGNOSIS — R634 Abnormal weight loss: Secondary | ICD-10-CM | POA: Diagnosis not present

## 2016-09-21 DIAGNOSIS — R278 Other lack of coordination: Secondary | ICD-10-CM | POA: Diagnosis not present

## 2016-09-21 DIAGNOSIS — R41841 Cognitive communication deficit: Secondary | ICD-10-CM | POA: Diagnosis not present

## 2016-09-21 DIAGNOSIS — M6281 Muscle weakness (generalized): Secondary | ICD-10-CM | POA: Diagnosis not present

## 2016-09-21 DIAGNOSIS — S8012XD Contusion of left lower leg, subsequent encounter: Secondary | ICD-10-CM | POA: Diagnosis not present

## 2016-09-22 DIAGNOSIS — R2689 Other abnormalities of gait and mobility: Secondary | ICD-10-CM | POA: Diagnosis not present

## 2016-09-22 DIAGNOSIS — M6281 Muscle weakness (generalized): Secondary | ICD-10-CM | POA: Diagnosis not present

## 2016-09-22 DIAGNOSIS — R278 Other lack of coordination: Secondary | ICD-10-CM | POA: Diagnosis not present

## 2016-09-22 DIAGNOSIS — R41841 Cognitive communication deficit: Secondary | ICD-10-CM | POA: Diagnosis not present

## 2016-09-22 DIAGNOSIS — R1312 Dysphagia, oropharyngeal phase: Secondary | ICD-10-CM | POA: Diagnosis not present

## 2016-09-22 DIAGNOSIS — K529 Noninfective gastroenteritis and colitis, unspecified: Secondary | ICD-10-CM | POA: Diagnosis not present

## 2016-09-23 DIAGNOSIS — R278 Other lack of coordination: Secondary | ICD-10-CM | POA: Diagnosis not present

## 2016-09-23 DIAGNOSIS — M6281 Muscle weakness (generalized): Secondary | ICD-10-CM | POA: Diagnosis not present

## 2016-09-23 DIAGNOSIS — R41841 Cognitive communication deficit: Secondary | ICD-10-CM | POA: Diagnosis not present

## 2016-09-23 DIAGNOSIS — R2689 Other abnormalities of gait and mobility: Secondary | ICD-10-CM | POA: Diagnosis not present

## 2016-09-23 DIAGNOSIS — K529 Noninfective gastroenteritis and colitis, unspecified: Secondary | ICD-10-CM | POA: Diagnosis not present

## 2016-09-23 DIAGNOSIS — R1312 Dysphagia, oropharyngeal phase: Secondary | ICD-10-CM | POA: Diagnosis not present

## 2016-09-24 DIAGNOSIS — R1312 Dysphagia, oropharyngeal phase: Secondary | ICD-10-CM | POA: Diagnosis not present

## 2016-09-24 DIAGNOSIS — R278 Other lack of coordination: Secondary | ICD-10-CM | POA: Diagnosis not present

## 2016-09-24 DIAGNOSIS — R41841 Cognitive communication deficit: Secondary | ICD-10-CM | POA: Diagnosis not present

## 2016-09-24 DIAGNOSIS — K529 Noninfective gastroenteritis and colitis, unspecified: Secondary | ICD-10-CM | POA: Diagnosis not present

## 2016-09-24 DIAGNOSIS — R2689 Other abnormalities of gait and mobility: Secondary | ICD-10-CM | POA: Diagnosis not present

## 2016-09-24 DIAGNOSIS — M6281 Muscle weakness (generalized): Secondary | ICD-10-CM | POA: Diagnosis not present

## 2016-09-25 DIAGNOSIS — I872 Venous insufficiency (chronic) (peripheral): Secondary | ICD-10-CM | POA: Diagnosis not present

## 2016-09-25 DIAGNOSIS — M549 Dorsalgia, unspecified: Secondary | ICD-10-CM | POA: Diagnosis not present

## 2016-09-25 DIAGNOSIS — N2889 Other specified disorders of kidney and ureter: Secondary | ICD-10-CM | POA: Diagnosis not present

## 2016-09-25 DIAGNOSIS — E46 Unspecified protein-calorie malnutrition: Secondary | ICD-10-CM | POA: Diagnosis not present

## 2016-09-25 DIAGNOSIS — K529 Noninfective gastroenteritis and colitis, unspecified: Secondary | ICD-10-CM | POA: Diagnosis not present

## 2016-09-25 DIAGNOSIS — I10 Essential (primary) hypertension: Secondary | ICD-10-CM | POA: Diagnosis not present

## 2016-09-25 DIAGNOSIS — F29 Unspecified psychosis not due to a substance or known physiological condition: Secondary | ICD-10-CM | POA: Diagnosis not present

## 2016-09-25 DIAGNOSIS — M6281 Muscle weakness (generalized): Secondary | ICD-10-CM | POA: Diagnosis not present

## 2016-09-25 DIAGNOSIS — R41841 Cognitive communication deficit: Secondary | ICD-10-CM | POA: Diagnosis not present

## 2016-09-25 DIAGNOSIS — R2689 Other abnormalities of gait and mobility: Secondary | ICD-10-CM | POA: Diagnosis not present

## 2016-09-25 DIAGNOSIS — R410 Disorientation, unspecified: Secondary | ICD-10-CM | POA: Diagnosis not present

## 2016-09-25 DIAGNOSIS — F0391 Unspecified dementia with behavioral disturbance: Secondary | ICD-10-CM | POA: Diagnosis not present

## 2016-09-25 DIAGNOSIS — R278 Other lack of coordination: Secondary | ICD-10-CM | POA: Diagnosis not present

## 2016-09-25 DIAGNOSIS — R1312 Dysphagia, oropharyngeal phase: Secondary | ICD-10-CM | POA: Diagnosis not present

## 2016-09-28 DIAGNOSIS — R278 Other lack of coordination: Secondary | ICD-10-CM | POA: Diagnosis not present

## 2016-09-28 DIAGNOSIS — R2689 Other abnormalities of gait and mobility: Secondary | ICD-10-CM | POA: Diagnosis not present

## 2016-09-28 DIAGNOSIS — K529 Noninfective gastroenteritis and colitis, unspecified: Secondary | ICD-10-CM | POA: Diagnosis not present

## 2016-09-28 DIAGNOSIS — R1312 Dysphagia, oropharyngeal phase: Secondary | ICD-10-CM | POA: Diagnosis not present

## 2016-09-28 DIAGNOSIS — R41841 Cognitive communication deficit: Secondary | ICD-10-CM | POA: Diagnosis not present

## 2016-09-28 DIAGNOSIS — M6281 Muscle weakness (generalized): Secondary | ICD-10-CM | POA: Diagnosis not present

## 2016-09-29 DIAGNOSIS — M6281 Muscle weakness (generalized): Secondary | ICD-10-CM | POA: Diagnosis not present

## 2016-09-29 DIAGNOSIS — R2689 Other abnormalities of gait and mobility: Secondary | ICD-10-CM | POA: Diagnosis not present

## 2016-09-29 DIAGNOSIS — R41841 Cognitive communication deficit: Secondary | ICD-10-CM | POA: Diagnosis not present

## 2016-09-29 DIAGNOSIS — R278 Other lack of coordination: Secondary | ICD-10-CM | POA: Diagnosis not present

## 2016-09-29 DIAGNOSIS — R1312 Dysphagia, oropharyngeal phase: Secondary | ICD-10-CM | POA: Diagnosis not present

## 2016-09-29 DIAGNOSIS — K529 Noninfective gastroenteritis and colitis, unspecified: Secondary | ICD-10-CM | POA: Diagnosis not present

## 2016-09-30 DIAGNOSIS — S22009D Unspecified fracture of unspecified thoracic vertebra, subsequent encounter for fracture with routine healing: Secondary | ICD-10-CM | POA: Diagnosis not present

## 2016-09-30 DIAGNOSIS — R1312 Dysphagia, oropharyngeal phase: Secondary | ICD-10-CM | POA: Diagnosis not present

## 2016-09-30 DIAGNOSIS — E039 Hypothyroidism, unspecified: Secondary | ICD-10-CM | POA: Diagnosis not present

## 2016-09-30 DIAGNOSIS — E785 Hyperlipidemia, unspecified: Secondary | ICD-10-CM | POA: Diagnosis not present

## 2016-09-30 DIAGNOSIS — R41841 Cognitive communication deficit: Secondary | ICD-10-CM | POA: Diagnosis not present

## 2016-09-30 DIAGNOSIS — R2689 Other abnormalities of gait and mobility: Secondary | ICD-10-CM | POA: Diagnosis not present

## 2016-09-30 DIAGNOSIS — M6281 Muscle weakness (generalized): Secondary | ICD-10-CM | POA: Diagnosis not present

## 2016-09-30 DIAGNOSIS — F0391 Unspecified dementia with behavioral disturbance: Secondary | ICD-10-CM | POA: Diagnosis not present

## 2016-09-30 DIAGNOSIS — C649 Malignant neoplasm of unspecified kidney, except renal pelvis: Secondary | ICD-10-CM | POA: Diagnosis not present

## 2016-09-30 DIAGNOSIS — R278 Other lack of coordination: Secondary | ICD-10-CM | POA: Diagnosis not present

## 2016-09-30 DIAGNOSIS — K529 Noninfective gastroenteritis and colitis, unspecified: Secondary | ICD-10-CM | POA: Diagnosis not present

## 2016-09-30 DIAGNOSIS — I10 Essential (primary) hypertension: Secondary | ICD-10-CM | POA: Diagnosis not present

## 2016-10-01 ENCOUNTER — Encounter (HOSPITAL_BASED_OUTPATIENT_CLINIC_OR_DEPARTMENT_OTHER): Payer: Medicare Other | Attending: Internal Medicine

## 2016-10-01 DIAGNOSIS — R41841 Cognitive communication deficit: Secondary | ICD-10-CM | POA: Diagnosis not present

## 2016-10-01 DIAGNOSIS — L97221 Non-pressure chronic ulcer of left calf limited to breakdown of skin: Secondary | ICD-10-CM | POA: Insufficient documentation

## 2016-10-01 DIAGNOSIS — K529 Noninfective gastroenteritis and colitis, unspecified: Secondary | ICD-10-CM | POA: Diagnosis not present

## 2016-10-01 DIAGNOSIS — S81811A Laceration without foreign body, right lower leg, initial encounter: Secondary | ICD-10-CM | POA: Diagnosis not present

## 2016-10-01 DIAGNOSIS — S81801A Unspecified open wound, right lower leg, initial encounter: Secondary | ICD-10-CM | POA: Diagnosis not present

## 2016-10-01 DIAGNOSIS — L97211 Non-pressure chronic ulcer of right calf limited to breakdown of skin: Secondary | ICD-10-CM | POA: Insufficient documentation

## 2016-10-01 DIAGNOSIS — F0390 Unspecified dementia without behavioral disturbance: Secondary | ICD-10-CM | POA: Insufficient documentation

## 2016-10-01 DIAGNOSIS — L97812 Non-pressure chronic ulcer of other part of right lower leg with fat layer exposed: Secondary | ICD-10-CM | POA: Insufficient documentation

## 2016-10-01 DIAGNOSIS — R278 Other lack of coordination: Secondary | ICD-10-CM | POA: Diagnosis not present

## 2016-10-01 DIAGNOSIS — L97521 Non-pressure chronic ulcer of other part of left foot limited to breakdown of skin: Secondary | ICD-10-CM | POA: Diagnosis not present

## 2016-10-01 DIAGNOSIS — I10 Essential (primary) hypertension: Secondary | ICD-10-CM | POA: Insufficient documentation

## 2016-10-01 DIAGNOSIS — I87333 Chronic venous hypertension (idiopathic) with ulcer and inflammation of bilateral lower extremity: Secondary | ICD-10-CM | POA: Insufficient documentation

## 2016-10-01 DIAGNOSIS — G629 Polyneuropathy, unspecified: Secondary | ICD-10-CM | POA: Insufficient documentation

## 2016-10-01 DIAGNOSIS — M6281 Muscle weakness (generalized): Secondary | ICD-10-CM | POA: Diagnosis not present

## 2016-10-01 DIAGNOSIS — S81802A Unspecified open wound, left lower leg, initial encounter: Secondary | ICD-10-CM | POA: Diagnosis not present

## 2016-10-01 DIAGNOSIS — R2689 Other abnormalities of gait and mobility: Secondary | ICD-10-CM | POA: Diagnosis not present

## 2016-10-01 DIAGNOSIS — R1312 Dysphagia, oropharyngeal phase: Secondary | ICD-10-CM | POA: Diagnosis not present

## 2016-10-02 ENCOUNTER — Encounter (HOSPITAL_BASED_OUTPATIENT_CLINIC_OR_DEPARTMENT_OTHER): Payer: Medicare Other

## 2016-10-02 DIAGNOSIS — K529 Noninfective gastroenteritis and colitis, unspecified: Secondary | ICD-10-CM | POA: Diagnosis not present

## 2016-10-02 DIAGNOSIS — R2689 Other abnormalities of gait and mobility: Secondary | ICD-10-CM | POA: Diagnosis not present

## 2016-10-02 DIAGNOSIS — R41841 Cognitive communication deficit: Secondary | ICD-10-CM | POA: Diagnosis not present

## 2016-10-02 DIAGNOSIS — M6281 Muscle weakness (generalized): Secondary | ICD-10-CM | POA: Diagnosis not present

## 2016-10-02 DIAGNOSIS — R1312 Dysphagia, oropharyngeal phase: Secondary | ICD-10-CM | POA: Diagnosis not present

## 2016-10-02 DIAGNOSIS — R278 Other lack of coordination: Secondary | ICD-10-CM | POA: Diagnosis not present

## 2016-10-04 DIAGNOSIS — R278 Other lack of coordination: Secondary | ICD-10-CM | POA: Diagnosis not present

## 2016-10-04 DIAGNOSIS — K529 Noninfective gastroenteritis and colitis, unspecified: Secondary | ICD-10-CM | POA: Diagnosis not present

## 2016-10-04 DIAGNOSIS — R1312 Dysphagia, oropharyngeal phase: Secondary | ICD-10-CM | POA: Diagnosis not present

## 2016-10-04 DIAGNOSIS — M6281 Muscle weakness (generalized): Secondary | ICD-10-CM | POA: Diagnosis not present

## 2016-10-04 DIAGNOSIS — R2689 Other abnormalities of gait and mobility: Secondary | ICD-10-CM | POA: Diagnosis not present

## 2016-10-04 DIAGNOSIS — R41841 Cognitive communication deficit: Secondary | ICD-10-CM | POA: Diagnosis not present

## 2016-10-05 DIAGNOSIS — K529 Noninfective gastroenteritis and colitis, unspecified: Secondary | ICD-10-CM | POA: Diagnosis not present

## 2016-10-05 DIAGNOSIS — R41841 Cognitive communication deficit: Secondary | ICD-10-CM | POA: Diagnosis not present

## 2016-10-05 DIAGNOSIS — R1312 Dysphagia, oropharyngeal phase: Secondary | ICD-10-CM | POA: Diagnosis not present

## 2016-10-05 DIAGNOSIS — R2689 Other abnormalities of gait and mobility: Secondary | ICD-10-CM | POA: Diagnosis not present

## 2016-10-05 DIAGNOSIS — M6281 Muscle weakness (generalized): Secondary | ICD-10-CM | POA: Diagnosis not present

## 2016-10-05 DIAGNOSIS — R278 Other lack of coordination: Secondary | ICD-10-CM | POA: Diagnosis not present

## 2016-10-07 DIAGNOSIS — S81801D Unspecified open wound, right lower leg, subsequent encounter: Secondary | ICD-10-CM | POA: Diagnosis not present

## 2016-10-07 DIAGNOSIS — R2689 Other abnormalities of gait and mobility: Secondary | ICD-10-CM | POA: Diagnosis not present

## 2016-10-07 DIAGNOSIS — R278 Other lack of coordination: Secondary | ICD-10-CM | POA: Diagnosis not present

## 2016-10-07 DIAGNOSIS — S81801A Unspecified open wound, right lower leg, initial encounter: Secondary | ICD-10-CM | POA: Diagnosis not present

## 2016-10-07 DIAGNOSIS — S81802D Unspecified open wound, left lower leg, subsequent encounter: Secondary | ICD-10-CM | POA: Diagnosis not present

## 2016-10-07 DIAGNOSIS — K529 Noninfective gastroenteritis and colitis, unspecified: Secondary | ICD-10-CM | POA: Diagnosis not present

## 2016-10-07 DIAGNOSIS — R41841 Cognitive communication deficit: Secondary | ICD-10-CM | POA: Diagnosis not present

## 2016-10-07 DIAGNOSIS — R1312 Dysphagia, oropharyngeal phase: Secondary | ICD-10-CM | POA: Diagnosis not present

## 2016-10-07 DIAGNOSIS — S81802A Unspecified open wound, left lower leg, initial encounter: Secondary | ICD-10-CM | POA: Diagnosis not present

## 2016-10-07 DIAGNOSIS — M6281 Muscle weakness (generalized): Secondary | ICD-10-CM | POA: Diagnosis not present

## 2016-10-08 DIAGNOSIS — R2689 Other abnormalities of gait and mobility: Secondary | ICD-10-CM | POA: Diagnosis not present

## 2016-10-08 DIAGNOSIS — R1312 Dysphagia, oropharyngeal phase: Secondary | ICD-10-CM | POA: Diagnosis not present

## 2016-10-08 DIAGNOSIS — M6281 Muscle weakness (generalized): Secondary | ICD-10-CM | POA: Diagnosis not present

## 2016-10-08 DIAGNOSIS — F0391 Unspecified dementia with behavioral disturbance: Secondary | ICD-10-CM | POA: Diagnosis not present

## 2016-10-08 DIAGNOSIS — E785 Hyperlipidemia, unspecified: Secondary | ICD-10-CM | POA: Diagnosis not present

## 2016-10-08 DIAGNOSIS — K529 Noninfective gastroenteritis and colitis, unspecified: Secondary | ICD-10-CM | POA: Diagnosis not present

## 2016-10-08 DIAGNOSIS — R278 Other lack of coordination: Secondary | ICD-10-CM | POA: Diagnosis not present

## 2016-10-08 DIAGNOSIS — R41841 Cognitive communication deficit: Secondary | ICD-10-CM | POA: Diagnosis not present

## 2016-10-08 DIAGNOSIS — I872 Venous insufficiency (chronic) (peripheral): Secondary | ICD-10-CM | POA: Diagnosis not present

## 2016-10-08 DIAGNOSIS — E559 Vitamin D deficiency, unspecified: Secondary | ICD-10-CM | POA: Diagnosis not present

## 2016-10-08 DIAGNOSIS — R634 Abnormal weight loss: Secondary | ICD-10-CM | POA: Diagnosis not present

## 2016-10-08 DIAGNOSIS — E039 Hypothyroidism, unspecified: Secondary | ICD-10-CM | POA: Diagnosis not present

## 2016-10-08 DIAGNOSIS — D649 Anemia, unspecified: Secondary | ICD-10-CM | POA: Diagnosis not present

## 2016-10-08 DIAGNOSIS — I10 Essential (primary) hypertension: Secondary | ICD-10-CM | POA: Diagnosis not present

## 2016-10-09 DIAGNOSIS — K529 Noninfective gastroenteritis and colitis, unspecified: Secondary | ICD-10-CM | POA: Diagnosis not present

## 2016-10-09 DIAGNOSIS — R1312 Dysphagia, oropharyngeal phase: Secondary | ICD-10-CM | POA: Diagnosis not present

## 2016-10-09 DIAGNOSIS — M6281 Muscle weakness (generalized): Secondary | ICD-10-CM | POA: Diagnosis not present

## 2016-10-09 DIAGNOSIS — R278 Other lack of coordination: Secondary | ICD-10-CM | POA: Diagnosis not present

## 2016-10-09 DIAGNOSIS — R2689 Other abnormalities of gait and mobility: Secondary | ICD-10-CM | POA: Diagnosis not present

## 2016-10-09 DIAGNOSIS — R41841 Cognitive communication deficit: Secondary | ICD-10-CM | POA: Diagnosis not present

## 2016-10-12 DIAGNOSIS — R2689 Other abnormalities of gait and mobility: Secondary | ICD-10-CM | POA: Diagnosis not present

## 2016-10-12 DIAGNOSIS — R41841 Cognitive communication deficit: Secondary | ICD-10-CM | POA: Diagnosis not present

## 2016-10-12 DIAGNOSIS — M6281 Muscle weakness (generalized): Secondary | ICD-10-CM | POA: Diagnosis not present

## 2016-10-12 DIAGNOSIS — R1312 Dysphagia, oropharyngeal phase: Secondary | ICD-10-CM | POA: Diagnosis not present

## 2016-10-12 DIAGNOSIS — R278 Other lack of coordination: Secondary | ICD-10-CM | POA: Diagnosis not present

## 2016-10-12 DIAGNOSIS — K529 Noninfective gastroenteritis and colitis, unspecified: Secondary | ICD-10-CM | POA: Diagnosis not present

## 2016-10-13 DIAGNOSIS — R1312 Dysphagia, oropharyngeal phase: Secondary | ICD-10-CM | POA: Diagnosis not present

## 2016-10-13 DIAGNOSIS — R2689 Other abnormalities of gait and mobility: Secondary | ICD-10-CM | POA: Diagnosis not present

## 2016-10-13 DIAGNOSIS — M6281 Muscle weakness (generalized): Secondary | ICD-10-CM | POA: Diagnosis not present

## 2016-10-13 DIAGNOSIS — R41841 Cognitive communication deficit: Secondary | ICD-10-CM | POA: Diagnosis not present

## 2016-10-13 DIAGNOSIS — R278 Other lack of coordination: Secondary | ICD-10-CM | POA: Diagnosis not present

## 2016-10-13 DIAGNOSIS — K529 Noninfective gastroenteritis and colitis, unspecified: Secondary | ICD-10-CM | POA: Diagnosis not present

## 2016-10-14 DIAGNOSIS — R1312 Dysphagia, oropharyngeal phase: Secondary | ICD-10-CM | POA: Diagnosis not present

## 2016-10-14 DIAGNOSIS — R278 Other lack of coordination: Secondary | ICD-10-CM | POA: Diagnosis not present

## 2016-10-14 DIAGNOSIS — M6281 Muscle weakness (generalized): Secondary | ICD-10-CM | POA: Diagnosis not present

## 2016-10-14 DIAGNOSIS — S81801D Unspecified open wound, right lower leg, subsequent encounter: Secondary | ICD-10-CM | POA: Diagnosis not present

## 2016-10-14 DIAGNOSIS — K529 Noninfective gastroenteritis and colitis, unspecified: Secondary | ICD-10-CM | POA: Diagnosis not present

## 2016-10-14 DIAGNOSIS — R2689 Other abnormalities of gait and mobility: Secondary | ICD-10-CM | POA: Diagnosis not present

## 2016-10-14 DIAGNOSIS — S81802D Unspecified open wound, left lower leg, subsequent encounter: Secondary | ICD-10-CM | POA: Diagnosis not present

## 2016-10-14 DIAGNOSIS — R41841 Cognitive communication deficit: Secondary | ICD-10-CM | POA: Diagnosis not present

## 2016-10-15 DIAGNOSIS — R41841 Cognitive communication deficit: Secondary | ICD-10-CM | POA: Diagnosis not present

## 2016-10-15 DIAGNOSIS — R2689 Other abnormalities of gait and mobility: Secondary | ICD-10-CM | POA: Diagnosis not present

## 2016-10-15 DIAGNOSIS — K529 Noninfective gastroenteritis and colitis, unspecified: Secondary | ICD-10-CM | POA: Diagnosis not present

## 2016-10-15 DIAGNOSIS — R1312 Dysphagia, oropharyngeal phase: Secondary | ICD-10-CM | POA: Diagnosis not present

## 2016-10-15 DIAGNOSIS — M6281 Muscle weakness (generalized): Secondary | ICD-10-CM | POA: Diagnosis not present

## 2016-10-15 DIAGNOSIS — R278 Other lack of coordination: Secondary | ICD-10-CM | POA: Diagnosis not present

## 2016-10-16 DIAGNOSIS — R41841 Cognitive communication deficit: Secondary | ICD-10-CM | POA: Diagnosis not present

## 2016-10-16 DIAGNOSIS — R2689 Other abnormalities of gait and mobility: Secondary | ICD-10-CM | POA: Diagnosis not present

## 2016-10-16 DIAGNOSIS — K529 Noninfective gastroenteritis and colitis, unspecified: Secondary | ICD-10-CM | POA: Diagnosis not present

## 2016-10-16 DIAGNOSIS — M6281 Muscle weakness (generalized): Secondary | ICD-10-CM | POA: Diagnosis not present

## 2016-10-16 DIAGNOSIS — R278 Other lack of coordination: Secondary | ICD-10-CM | POA: Diagnosis not present

## 2016-10-16 DIAGNOSIS — R1312 Dysphagia, oropharyngeal phase: Secondary | ICD-10-CM | POA: Diagnosis not present

## 2016-10-19 DIAGNOSIS — R278 Other lack of coordination: Secondary | ICD-10-CM | POA: Diagnosis not present

## 2016-10-19 DIAGNOSIS — M6281 Muscle weakness (generalized): Secondary | ICD-10-CM | POA: Diagnosis not present

## 2016-10-19 DIAGNOSIS — R1312 Dysphagia, oropharyngeal phase: Secondary | ICD-10-CM | POA: Diagnosis not present

## 2016-10-19 DIAGNOSIS — K529 Noninfective gastroenteritis and colitis, unspecified: Secondary | ICD-10-CM | POA: Diagnosis not present

## 2016-10-19 DIAGNOSIS — R2689 Other abnormalities of gait and mobility: Secondary | ICD-10-CM | POA: Diagnosis not present

## 2016-10-19 DIAGNOSIS — R41841 Cognitive communication deficit: Secondary | ICD-10-CM | POA: Diagnosis not present

## 2016-10-21 DIAGNOSIS — F338 Other recurrent depressive disorders: Secondary | ICD-10-CM | POA: Diagnosis not present

## 2016-10-21 DIAGNOSIS — S81802D Unspecified open wound, left lower leg, subsequent encounter: Secondary | ICD-10-CM | POA: Diagnosis not present

## 2016-10-21 DIAGNOSIS — F419 Anxiety disorder, unspecified: Secondary | ICD-10-CM | POA: Diagnosis not present

## 2016-10-21 DIAGNOSIS — S81801D Unspecified open wound, right lower leg, subsequent encounter: Secondary | ICD-10-CM | POA: Diagnosis not present

## 2016-10-28 DIAGNOSIS — S81802D Unspecified open wound, left lower leg, subsequent encounter: Secondary | ICD-10-CM | POA: Diagnosis not present

## 2016-10-28 DIAGNOSIS — S81801D Unspecified open wound, right lower leg, subsequent encounter: Secondary | ICD-10-CM | POA: Diagnosis not present

## 2016-11-04 DIAGNOSIS — S81801D Unspecified open wound, right lower leg, subsequent encounter: Secondary | ICD-10-CM | POA: Diagnosis not present

## 2016-11-04 DIAGNOSIS — I83028 Varicose veins of left lower extremity with ulcer other part of lower leg: Secondary | ICD-10-CM | POA: Diagnosis not present

## 2016-11-04 DIAGNOSIS — S81802D Unspecified open wound, left lower leg, subsequent encounter: Secondary | ICD-10-CM | POA: Diagnosis not present

## 2016-11-11 DIAGNOSIS — S81802D Unspecified open wound, left lower leg, subsequent encounter: Secondary | ICD-10-CM | POA: Diagnosis not present

## 2016-11-11 DIAGNOSIS — I83028 Varicose veins of left lower extremity with ulcer other part of lower leg: Secondary | ICD-10-CM | POA: Diagnosis not present

## 2016-11-11 DIAGNOSIS — S81801D Unspecified open wound, right lower leg, subsequent encounter: Secondary | ICD-10-CM | POA: Diagnosis not present

## 2016-11-13 DIAGNOSIS — E46 Unspecified protein-calorie malnutrition: Secondary | ICD-10-CM | POA: Diagnosis not present

## 2016-11-13 DIAGNOSIS — F0390 Unspecified dementia without behavioral disturbance: Secondary | ICD-10-CM | POA: Diagnosis not present

## 2016-11-13 DIAGNOSIS — I872 Venous insufficiency (chronic) (peripheral): Secondary | ICD-10-CM | POA: Diagnosis not present

## 2016-11-13 DIAGNOSIS — I10 Essential (primary) hypertension: Secondary | ICD-10-CM | POA: Diagnosis not present

## 2016-11-18 DIAGNOSIS — I83028 Varicose veins of left lower extremity with ulcer other part of lower leg: Secondary | ICD-10-CM | POA: Diagnosis not present

## 2016-11-18 DIAGNOSIS — S81802D Unspecified open wound, left lower leg, subsequent encounter: Secondary | ICD-10-CM | POA: Diagnosis not present

## 2016-11-18 DIAGNOSIS — S81801D Unspecified open wound, right lower leg, subsequent encounter: Secondary | ICD-10-CM | POA: Diagnosis not present

## 2016-11-26 DIAGNOSIS — I83028 Varicose veins of left lower extremity with ulcer other part of lower leg: Secondary | ICD-10-CM | POA: Diagnosis not present

## 2016-12-02 DIAGNOSIS — I83028 Varicose veins of left lower extremity with ulcer other part of lower leg: Secondary | ICD-10-CM | POA: Diagnosis not present

## 2016-12-09 DIAGNOSIS — I83028 Varicose veins of left lower extremity with ulcer other part of lower leg: Secondary | ICD-10-CM | POA: Diagnosis not present

## 2016-12-23 DIAGNOSIS — G4700 Insomnia, unspecified: Secondary | ICD-10-CM | POA: Diagnosis not present

## 2016-12-23 DIAGNOSIS — F330 Major depressive disorder, recurrent, mild: Secondary | ICD-10-CM | POA: Diagnosis not present

## 2016-12-23 DIAGNOSIS — G301 Alzheimer's disease with late onset: Secondary | ICD-10-CM | POA: Diagnosis not present

## 2016-12-23 DIAGNOSIS — F0390 Unspecified dementia without behavioral disturbance: Secondary | ICD-10-CM | POA: Diagnosis not present

## 2016-12-28 DIAGNOSIS — G4700 Insomnia, unspecified: Secondary | ICD-10-CM | POA: Diagnosis not present

## 2016-12-28 DIAGNOSIS — F330 Major depressive disorder, recurrent, mild: Secondary | ICD-10-CM | POA: Diagnosis not present

## 2016-12-31 DIAGNOSIS — Z79899 Other long term (current) drug therapy: Secondary | ICD-10-CM | POA: Diagnosis not present

## 2016-12-31 DIAGNOSIS — D649 Anemia, unspecified: Secondary | ICD-10-CM | POA: Diagnosis not present

## 2016-12-31 DIAGNOSIS — I10 Essential (primary) hypertension: Secondary | ICD-10-CM | POA: Diagnosis not present

## 2017-01-07 DIAGNOSIS — D49512 Neoplasm of unspecified behavior of left kidney: Secondary | ICD-10-CM | POA: Diagnosis not present

## 2017-01-07 DIAGNOSIS — F0391 Unspecified dementia with behavioral disturbance: Secondary | ICD-10-CM | POA: Diagnosis not present

## 2017-01-07 DIAGNOSIS — Z79899 Other long term (current) drug therapy: Secondary | ICD-10-CM | POA: Diagnosis not present

## 2017-01-07 DIAGNOSIS — K5909 Other constipation: Secondary | ICD-10-CM | POA: Diagnosis not present

## 2017-01-07 DIAGNOSIS — R319 Hematuria, unspecified: Secondary | ICD-10-CM | POA: Diagnosis not present

## 2017-01-07 DIAGNOSIS — N390 Urinary tract infection, site not specified: Secondary | ICD-10-CM | POA: Diagnosis not present

## 2017-01-27 DIAGNOSIS — F0390 Unspecified dementia without behavioral disturbance: Secondary | ICD-10-CM | POA: Diagnosis not present

## 2017-01-27 DIAGNOSIS — F330 Major depressive disorder, recurrent, mild: Secondary | ICD-10-CM | POA: Diagnosis not present

## 2017-01-27 DIAGNOSIS — G301 Alzheimer's disease with late onset: Secondary | ICD-10-CM | POA: Diagnosis not present

## 2017-01-27 DIAGNOSIS — G4700 Insomnia, unspecified: Secondary | ICD-10-CM | POA: Diagnosis not present

## 2017-01-28 DIAGNOSIS — F330 Major depressive disorder, recurrent, mild: Secondary | ICD-10-CM | POA: Diagnosis not present

## 2017-01-28 DIAGNOSIS — M4856XG Collapsed vertebra, not elsewhere classified, lumbar region, subsequent encounter for fracture with delayed healing: Secondary | ICD-10-CM | POA: Diagnosis not present

## 2017-01-28 DIAGNOSIS — G301 Alzheimer's disease with late onset: Secondary | ICD-10-CM | POA: Diagnosis not present

## 2017-01-28 DIAGNOSIS — G4700 Insomnia, unspecified: Secondary | ICD-10-CM | POA: Diagnosis not present

## 2017-01-28 DIAGNOSIS — I10 Essential (primary) hypertension: Secondary | ICD-10-CM | POA: Diagnosis not present

## 2017-01-28 DIAGNOSIS — S8012XD Contusion of left lower leg, subsequent encounter: Secondary | ICD-10-CM | POA: Diagnosis not present

## 2017-01-28 DIAGNOSIS — F0390 Unspecified dementia without behavioral disturbance: Secondary | ICD-10-CM | POA: Diagnosis not present

## 2017-01-28 DIAGNOSIS — F0391 Unspecified dementia with behavioral disturbance: Secondary | ICD-10-CM | POA: Diagnosis not present

## 2017-02-05 DIAGNOSIS — R2689 Other abnormalities of gait and mobility: Secondary | ICD-10-CM | POA: Diagnosis not present

## 2017-02-05 DIAGNOSIS — E46 Unspecified protein-calorie malnutrition: Secondary | ICD-10-CM | POA: Diagnosis not present

## 2017-02-05 DIAGNOSIS — D49512 Neoplasm of unspecified behavior of left kidney: Secondary | ICD-10-CM | POA: Diagnosis not present

## 2017-02-05 DIAGNOSIS — F0391 Unspecified dementia with behavioral disturbance: Secondary | ICD-10-CM | POA: Diagnosis not present

## 2017-02-05 DIAGNOSIS — R41841 Cognitive communication deficit: Secondary | ICD-10-CM | POA: Diagnosis not present

## 2017-02-05 DIAGNOSIS — K529 Noninfective gastroenteritis and colitis, unspecified: Secondary | ICD-10-CM | POA: Diagnosis not present

## 2017-02-05 DIAGNOSIS — M6281 Muscle weakness (generalized): Secondary | ICD-10-CM | POA: Diagnosis not present

## 2017-02-05 DIAGNOSIS — R634 Abnormal weight loss: Secondary | ICD-10-CM | POA: Diagnosis not present

## 2017-02-05 DIAGNOSIS — R1312 Dysphagia, oropharyngeal phase: Secondary | ICD-10-CM | POA: Diagnosis not present

## 2017-02-05 DIAGNOSIS — S22009D Unspecified fracture of unspecified thoracic vertebra, subsequent encounter for fracture with routine healing: Secondary | ICD-10-CM | POA: Diagnosis not present

## 2017-02-05 DIAGNOSIS — C649 Malignant neoplasm of unspecified kidney, except renal pelvis: Secondary | ICD-10-CM | POA: Diagnosis not present

## 2017-02-05 DIAGNOSIS — M1990 Unspecified osteoarthritis, unspecified site: Secondary | ICD-10-CM | POA: Diagnosis not present

## 2017-02-05 DIAGNOSIS — E039 Hypothyroidism, unspecified: Secondary | ICD-10-CM | POA: Diagnosis not present

## 2017-02-05 DIAGNOSIS — E785 Hyperlipidemia, unspecified: Secondary | ICD-10-CM | POA: Diagnosis not present

## 2017-02-05 DIAGNOSIS — R278 Other lack of coordination: Secondary | ICD-10-CM | POA: Diagnosis not present

## 2017-02-05 DIAGNOSIS — M25521 Pain in right elbow: Secondary | ICD-10-CM | POA: Diagnosis not present

## 2017-02-05 DIAGNOSIS — I10 Essential (primary) hypertension: Secondary | ICD-10-CM | POA: Diagnosis not present

## 2017-02-05 DIAGNOSIS — R1311 Dysphagia, oral phase: Secondary | ICD-10-CM | POA: Diagnosis not present

## 2017-02-06 DIAGNOSIS — R278 Other lack of coordination: Secondary | ICD-10-CM | POA: Diagnosis not present

## 2017-02-06 DIAGNOSIS — R1311 Dysphagia, oral phase: Secondary | ICD-10-CM | POA: Diagnosis not present

## 2017-02-06 DIAGNOSIS — R1312 Dysphagia, oropharyngeal phase: Secondary | ICD-10-CM | POA: Diagnosis not present

## 2017-02-06 DIAGNOSIS — R41841 Cognitive communication deficit: Secondary | ICD-10-CM | POA: Diagnosis not present

## 2017-02-06 DIAGNOSIS — R2689 Other abnormalities of gait and mobility: Secondary | ICD-10-CM | POA: Diagnosis not present

## 2017-02-06 DIAGNOSIS — M6281 Muscle weakness (generalized): Secondary | ICD-10-CM | POA: Diagnosis not present

## 2017-02-09 ENCOUNTER — Emergency Department (HOSPITAL_COMMUNITY): Payer: Medicare Other

## 2017-02-09 ENCOUNTER — Other Ambulatory Visit: Payer: Self-pay

## 2017-02-09 ENCOUNTER — Encounter (HOSPITAL_COMMUNITY): Payer: Self-pay

## 2017-02-09 ENCOUNTER — Inpatient Hospital Stay (HOSPITAL_COMMUNITY)
Admission: EM | Admit: 2017-02-09 | Discharge: 2017-02-11 | DRG: 054 | Disposition: A | Discharge status: Hospice | Payer: Medicare Other | Attending: Internal Medicine | Admitting: Internal Medicine

## 2017-02-09 ENCOUNTER — Observation Stay (HOSPITAL_COMMUNITY): Payer: Medicare Other

## 2017-02-09 DIAGNOSIS — N179 Acute kidney failure, unspecified: Secondary | ICD-10-CM | POA: Diagnosis present

## 2017-02-09 DIAGNOSIS — C649 Malignant neoplasm of unspecified kidney, except renal pelvis: Secondary | ICD-10-CM | POA: Diagnosis not present

## 2017-02-09 DIAGNOSIS — F03918 Unspecified dementia, unspecified severity, with other behavioral disturbance: Secondary | ICD-10-CM

## 2017-02-09 DIAGNOSIS — I872 Venous insufficiency (chronic) (peripheral): Secondary | ICD-10-CM | POA: Diagnosis not present

## 2017-02-09 DIAGNOSIS — R278 Other lack of coordination: Secondary | ICD-10-CM | POA: Diagnosis not present

## 2017-02-09 DIAGNOSIS — J690 Pneumonitis due to inhalation of food and vomit: Secondary | ICD-10-CM

## 2017-02-09 DIAGNOSIS — Z79899 Other long term (current) drug therapy: Secondary | ICD-10-CM

## 2017-02-09 DIAGNOSIS — N390 Urinary tract infection, site not specified: Secondary | ICD-10-CM | POA: Diagnosis not present

## 2017-02-09 DIAGNOSIS — G92 Toxic encephalopathy: Secondary | ICD-10-CM | POA: Diagnosis not present

## 2017-02-09 DIAGNOSIS — R627 Adult failure to thrive: Secondary | ICD-10-CM | POA: Diagnosis not present

## 2017-02-09 DIAGNOSIS — R1312 Dysphagia, oropharyngeal phase: Secondary | ICD-10-CM | POA: Diagnosis not present

## 2017-02-09 DIAGNOSIS — R402441 Other coma, without documented Glasgow coma scale score, or with partial score reported, in the field [EMT or ambulance]: Secondary | ICD-10-CM | POA: Diagnosis not present

## 2017-02-09 DIAGNOSIS — R64 Cachexia: Secondary | ICD-10-CM | POA: Diagnosis present

## 2017-02-09 DIAGNOSIS — E785 Hyperlipidemia, unspecified: Secondary | ICD-10-CM | POA: Diagnosis not present

## 2017-02-09 DIAGNOSIS — E039 Hypothyroidism, unspecified: Secondary | ICD-10-CM | POA: Diagnosis present

## 2017-02-09 DIAGNOSIS — E43 Unspecified severe protein-calorie malnutrition: Secondary | ICD-10-CM | POA: Diagnosis present

## 2017-02-09 DIAGNOSIS — I959 Hypotension, unspecified: Secondary | ICD-10-CM | POA: Diagnosis present

## 2017-02-09 DIAGNOSIS — R41841 Cognitive communication deficit: Secondary | ICD-10-CM | POA: Diagnosis not present

## 2017-02-09 DIAGNOSIS — F0391 Unspecified dementia with behavioral disturbance: Secondary | ICD-10-CM | POA: Diagnosis not present

## 2017-02-09 DIAGNOSIS — Z885 Allergy status to narcotic agent status: Secondary | ICD-10-CM

## 2017-02-09 DIAGNOSIS — K449 Diaphragmatic hernia without obstruction or gangrene: Secondary | ICD-10-CM | POA: Diagnosis not present

## 2017-02-09 DIAGNOSIS — D649 Anemia, unspecified: Secondary | ICD-10-CM | POA: Diagnosis not present

## 2017-02-09 DIAGNOSIS — Z515 Encounter for palliative care: Secondary | ICD-10-CM

## 2017-02-09 DIAGNOSIS — E559 Vitamin D deficiency, unspecified: Secondary | ICD-10-CM | POA: Diagnosis not present

## 2017-02-09 DIAGNOSIS — C7931 Secondary malignant neoplasm of brain: Principal | ICD-10-CM | POA: Diagnosis present

## 2017-02-09 DIAGNOSIS — D49512 Neoplasm of unspecified behavior of left kidney: Secondary | ICD-10-CM | POA: Diagnosis not present

## 2017-02-09 DIAGNOSIS — R4182 Altered mental status, unspecified: Secondary | ICD-10-CM

## 2017-02-09 DIAGNOSIS — G629 Polyneuropathy, unspecified: Secondary | ICD-10-CM | POA: Diagnosis present

## 2017-02-09 DIAGNOSIS — I10 Essential (primary) hypertension: Secondary | ICD-10-CM | POA: Diagnosis not present

## 2017-02-09 DIAGNOSIS — Z9049 Acquired absence of other specified parts of digestive tract: Secondary | ICD-10-CM

## 2017-02-09 DIAGNOSIS — I619 Nontraumatic intracerebral hemorrhage, unspecified: Secondary | ICD-10-CM | POA: Diagnosis not present

## 2017-02-09 DIAGNOSIS — R7881 Bacteremia: Secondary | ICD-10-CM | POA: Diagnosis present

## 2017-02-09 DIAGNOSIS — R400 Somnolence: Secondary | ICD-10-CM

## 2017-02-09 DIAGNOSIS — Z79890 Hormone replacement therapy (postmenopausal): Secondary | ICD-10-CM

## 2017-02-09 DIAGNOSIS — E860 Dehydration: Secondary | ICD-10-CM | POA: Diagnosis present

## 2017-02-09 DIAGNOSIS — G936 Cerebral edema: Secondary | ICD-10-CM

## 2017-02-09 DIAGNOSIS — R634 Abnormal weight loss: Secondary | ICD-10-CM | POA: Diagnosis not present

## 2017-02-09 DIAGNOSIS — R339 Retention of urine, unspecified: Secondary | ICD-10-CM | POA: Diagnosis present

## 2017-02-09 DIAGNOSIS — K529 Noninfective gastroenteritis and colitis, unspecified: Secondary | ICD-10-CM | POA: Diagnosis not present

## 2017-02-09 DIAGNOSIS — R319 Hematuria, unspecified: Secondary | ICD-10-CM | POA: Diagnosis not present

## 2017-02-09 DIAGNOSIS — R1311 Dysphagia, oral phase: Secondary | ICD-10-CM | POA: Diagnosis not present

## 2017-02-09 DIAGNOSIS — Z87440 Personal history of urinary (tract) infections: Secondary | ICD-10-CM

## 2017-02-09 DIAGNOSIS — B9620 Unspecified Escherichia coli [E. coli] as the cause of diseases classified elsewhere: Secondary | ICD-10-CM | POA: Diagnosis present

## 2017-02-09 DIAGNOSIS — G939 Disorder of brain, unspecified: Secondary | ICD-10-CM | POA: Diagnosis not present

## 2017-02-09 DIAGNOSIS — I611 Nontraumatic intracerebral hemorrhage in hemisphere, cortical: Secondary | ICD-10-CM | POA: Diagnosis present

## 2017-02-09 DIAGNOSIS — Z681 Body mass index (BMI) 19 or less, adult: Secondary | ICD-10-CM

## 2017-02-09 DIAGNOSIS — Z993 Dependence on wheelchair: Secondary | ICD-10-CM

## 2017-02-09 DIAGNOSIS — N2889 Other specified disorders of kidney and ureter: Secondary | ICD-10-CM | POA: Diagnosis present

## 2017-02-09 DIAGNOSIS — Z66 Do not resuscitate: Secondary | ICD-10-CM | POA: Diagnosis not present

## 2017-02-09 DIAGNOSIS — Z87892 Personal history of anaphylaxis: Secondary | ICD-10-CM

## 2017-02-09 DIAGNOSIS — H409 Unspecified glaucoma: Secondary | ICD-10-CM

## 2017-02-09 DIAGNOSIS — Z888 Allergy status to other drugs, medicaments and biological substances status: Secondary | ICD-10-CM

## 2017-02-09 DIAGNOSIS — H3530 Unspecified macular degeneration: Secondary | ICD-10-CM | POA: Diagnosis present

## 2017-02-09 DIAGNOSIS — R2689 Other abnormalities of gait and mobility: Secondary | ICD-10-CM | POA: Diagnosis not present

## 2017-02-09 DIAGNOSIS — Z7189 Other specified counseling: Secondary | ICD-10-CM

## 2017-02-09 DIAGNOSIS — G9389 Other specified disorders of brain: Secondary | ICD-10-CM | POA: Diagnosis present

## 2017-02-09 DIAGNOSIS — D72829 Elevated white blood cell count, unspecified: Secondary | ICD-10-CM

## 2017-02-09 DIAGNOSIS — Z8249 Family history of ischemic heart disease and other diseases of the circulatory system: Secondary | ICD-10-CM

## 2017-02-09 DIAGNOSIS — S22009D Unspecified fracture of unspecified thoracic vertebra, subsequent encounter for fracture with routine healing: Secondary | ICD-10-CM | POA: Diagnosis not present

## 2017-02-09 DIAGNOSIS — M7989 Other specified soft tissue disorders: Secondary | ICD-10-CM | POA: Diagnosis present

## 2017-02-09 LAB — URINALYSIS, ROUTINE W REFLEX MICROSCOPIC
BILIRUBIN URINE: NEGATIVE
Glucose, UA: NEGATIVE mg/dL
Ketones, ur: 5 mg/dL — AB
Nitrite: NEGATIVE
PROTEIN: 30 mg/dL — AB
SPECIFIC GRAVITY, URINE: 1.016 (ref 1.005–1.030)
SQUAMOUS EPITHELIAL / LPF: NONE SEEN
pH: 5 (ref 5.0–8.0)

## 2017-02-09 LAB — COMPREHENSIVE METABOLIC PANEL
ALBUMIN: 2.6 g/dL — AB (ref 3.5–5.0)
ALT: 43 U/L (ref 14–54)
ANION GAP: 14 (ref 5–15)
AST: 61 U/L — ABNORMAL HIGH (ref 15–41)
Alkaline Phosphatase: 117 U/L (ref 38–126)
BILIRUBIN TOTAL: 1.5 mg/dL — AB (ref 0.3–1.2)
BUN: 52 mg/dL — ABNORMAL HIGH (ref 6–20)
CALCIUM: 8.2 mg/dL — AB (ref 8.9–10.3)
CO2: 22 mmol/L (ref 22–32)
Chloride: 100 mmol/L — ABNORMAL LOW (ref 101–111)
Creatinine, Ser: 1.15 mg/dL — ABNORMAL HIGH (ref 0.44–1.00)
GFR, EST AFRICAN AMERICAN: 46 mL/min — AB (ref >60)
GFR, EST NON AFRICAN AMERICAN: 39 mL/min — AB (ref >60)
Glucose, Bld: 67 mg/dL (ref 65–99)
POTASSIUM: 3.5 mmol/L (ref 3.5–5.1)
Sodium: 136 mmol/L (ref 135–145)
TOTAL PROTEIN: 6 g/dL — AB (ref 6.5–8.1)

## 2017-02-09 LAB — MAGNESIUM: MAGNESIUM: 1.3 mg/dL — AB (ref 1.7–2.4)

## 2017-02-09 LAB — CBC WITH DIFFERENTIAL/PLATELET
BLASTS: 0 %
Band Neutrophils: 24 %
Basophils Absolute: 0 10*3/uL (ref 0.0–0.1)
Basophils Relative: 0 %
EOS ABS: 0 10*3/uL (ref 0.0–0.7)
Eosinophils Relative: 0 %
HEMATOCRIT: 36.4 % (ref 36.0–46.0)
Hemoglobin: 12.4 g/dL (ref 12.0–15.0)
Lymphocytes Relative: 10 %
Lymphs Abs: 4.8 10*3/uL — ABNORMAL HIGH (ref 0.7–4.0)
MCH: 32.6 pg (ref 26.0–34.0)
MCHC: 34.1 g/dL (ref 30.0–36.0)
MCV: 95.8 fL (ref 78.0–100.0)
METAMYELOCYTES PCT: 2 %
MYELOCYTES: 0 %
Monocytes Absolute: 1.9 10*3/uL — ABNORMAL HIGH (ref 0.1–1.0)
Monocytes Relative: 4 %
NEUTROS ABS: 40.9 10*3/uL — AB (ref 1.7–7.7)
Neutrophils Relative %: 60 %
Other: 0 %
PROMYELOCYTES ABS: 0 %
Platelets: 124 10*3/uL — ABNORMAL LOW (ref 150–400)
RBC: 3.8 MIL/uL — AB (ref 3.87–5.11)
RDW: 15.1 % (ref 11.5–15.5)
WBC Morphology: INCREASED
WBC: 47.6 10*3/uL — AB (ref 4.0–10.5)
nRBC: 0 /100 WBC

## 2017-02-09 LAB — PROTIME-INR
INR: 1.08
PROTHROMBIN TIME: 13.9 s (ref 11.4–15.2)

## 2017-02-09 LAB — CK: Total CK: 405 U/L — ABNORMAL HIGH (ref 38–234)

## 2017-02-09 LAB — I-STAT CG4 LACTIC ACID, ED: LACTIC ACID, VENOUS: 1.78 mmol/L (ref 0.5–1.9)

## 2017-02-09 LAB — LIPASE, BLOOD: Lipase: 20 U/L (ref 11–51)

## 2017-02-09 MED ORDER — ONDANSETRON HCL 4 MG PO TABS: 4.0000 mg | ORAL_TABLET | Freq: Four times a day (QID) | ORAL | Status: DC | PRN

## 2017-02-09 MED ORDER — SODIUM CHLORIDE 0.9 % IV SOLN
INTRAVENOUS | Status: AC
  Administered 2017-02-10: via INTRAVENOUS

## 2017-02-09 MED ORDER — ACETAMINOPHEN 325 MG PO TABS: 650.0000 mg | ORAL_TABLET | Freq: Four times a day (QID) | ORAL | Status: DC | PRN

## 2017-02-09 MED ORDER — FLUVOXAMINE MALEATE 50 MG PO TABS
25.0000 mg | ORAL_TABLET | Freq: Every day | ORAL | Status: DC
  Filled 2017-02-09: qty 1

## 2017-02-09 MED ORDER — DORZOLAMIDE HCL-TIMOLOL MAL 2-0.5 % OP SOLN
1.0000 [drp] | Freq: Two times a day (BID) | OPHTHALMIC | Status: DC
  Administered 2017-02-10: 1 [drp] via OPHTHALMIC
  Filled 2017-02-09: qty 10

## 2017-02-09 MED ORDER — SODIUM CHLORIDE 0.9 % IV BOLUS (SEPSIS)
1000.0000 mL | Freq: Once | INTRAVENOUS | Status: AC
  Administered 2017-02-09: 1000 mL via INTRAVENOUS

## 2017-02-09 MED ORDER — ACETAMINOPHEN 650 MG RE SUPP
650.0000 mg | Freq: Four times a day (QID) | RECTAL | Status: DC | PRN
  Administered 2017-02-11 (×2): 650 mg via RECTAL
  Filled 2017-02-09 (×2): qty 1

## 2017-02-09 MED ORDER — LATANOPROST 0.005 % OP SOLN
1.0000 [drp] | Freq: Every day | OPHTHALMIC | Status: DC
  Filled 2017-02-09: qty 2.5

## 2017-02-09 MED ORDER — FENTANYL CITRATE (PF) 100 MCG/2ML IJ SOLN
12.5000 ug | Freq: Once | INTRAMUSCULAR | Status: AC
  Administered 2017-02-09: 12.5 ug via INTRAVENOUS
  Filled 2017-02-09: qty 2

## 2017-02-09 MED ORDER — LEVOTHYROXINE SODIUM 25 MCG PO TABS: 12.5000 ug | ORAL_TABLET | ORAL | Status: DC

## 2017-02-09 MED ORDER — DEXAMETHASONE SODIUM PHOSPHATE 10 MG/ML IJ SOLN
4.0000 mg | Freq: Four times a day (QID) | INTRAMUSCULAR | Status: DC
  Administered 2017-02-09 – 2017-02-10 (×2): 4 mg via INTRAVENOUS
  Filled 2017-02-09: qty 0.4
  Filled 2017-02-09: qty 1
  Filled 2017-02-09: qty 0.4

## 2017-02-09 MED ORDER — ONDANSETRON HCL 4 MG/2ML IJ SOLN: 4.0000 mg | Freq: Four times a day (QID) | INTRAMUSCULAR | Status: DC | PRN

## 2017-02-09 MED ORDER — CHOLECALCIFEROL 10 MCG (400 UNIT) PO TABS
400.0000 [IU] | ORAL_TABLET | Freq: Every day | ORAL | Status: DC
  Filled 2017-02-09 (×2): qty 1

## 2017-02-09 MED ORDER — PILOCARPINE HCL 4 % OP SOLN
1.0000 [drp] | Freq: Four times a day (QID) | OPHTHALMIC | Status: DC
  Administered 2017-02-10 (×3): 1 [drp] via OPHTHALMIC
  Filled 2017-02-09: qty 15

## 2017-02-09 MED ORDER — MELATONIN 10 MG PO TABS: 5.0000 mg | ORAL_TABLET | Freq: Every day | ORAL | Status: DC

## 2017-02-09 MED ORDER — NEBIVOLOL HCL 2.5 MG PO TABS: 2.5000 mg | ORAL_TABLET | Freq: Every evening | ORAL | Status: DC

## 2017-02-09 MED ORDER — BISACODYL 10 MG RE SUPP: 10.0000 mg | Freq: Every day | RECTAL | Status: DC | PRN

## 2017-02-09 MED ORDER — BRIMONIDINE TARTRATE 0.2 % OP SOLN
1.0000 [drp] | Freq: Three times a day (TID) | OPHTHALMIC | Status: DC
  Administered 2017-02-10 (×2): 1 [drp] via OPHTHALMIC
  Filled 2017-02-09: qty 5

## 2017-02-09 NOTE — Progress Notes (Signed)
Pt too kyphotic for head coil. Pt unable to lay flat enough for mri of the brain scan. Pt was premedicated prior to coming to mri.

## 2017-02-09 NOTE — ED Notes (Signed)
Pt has been laying on her right side, family states that she "favors that side".  Offered pt to roll her to another side, she declines.

## 2017-02-09 NOTE — ED Notes (Signed)
Bed: WA01 Expected date:  Expected time:  Means of arrival:  Comments: EMS-FTT

## 2017-02-09 NOTE — ED Notes (Signed)
Please note that pt was unable to tolerate MRI due to positioning.  EDP is aware as is family

## 2017-02-09 NOTE — ED Provider Notes (Signed)
[facility] DEPT Provider Note   CSN: [id number] Arrival date & time: 02/09/17  1658     History   Chief Complaint No chief complaint on file. AMS  HPI MONACA [name] is a 83 y.o. female.  The history is provided by a relative and medical records. The history is limited by the condition of the patient.  Altered Mental Status   This is a new problem. The current episode started more than 2 days ago (around 3 days worsening). The problem has been rapidly worsening. Associated symptoms include confusion, somnolence, delusions and hallucinations (chronic). Pertinent negatives include no weakness and no agitation. Her past medical history is significant for hypertension and dementia.   LVL 5 Caveat as PT has dementia and AMS and will not answer questions.   Past Medical History:  Diagnosis Date  . Dementia   . Glaucoma   . Hyperlipidemia   . Hypertension   . Macular degeneration   . Peripheral neuropathy   . Stasis dermatitis of both legs    goes to the [facility]    Patient Active Problem List   Diagnosis Date Noted  . Colitis presumed infectious 07/14/2016  . Essential hypertension 07/14/2016  . Hyperlipidemia 07/14/2016  . Hyperglycemia 07/14/2016  . Stasis dermatitis of both legs 07/14/2016  . Dementia with aggressive behavior 09/03/2015    Past Surgical History:  Procedure Laterality Date  . CHOLECYSTECTOMY    . EYE SURGERY    . HEMICOLECTOMY     remote    OB History    No data available       Home Medications    Prior to Admission medications   Medication Sig Start Date End Date Taking? Authorizing Provider  acetaminophen (TYLENOL) 500 MG tablet Take 500 mg by mouth every 6 (six) hours as needed for mild pain, moderate pain, fever or headache.    [provider]  amLODipine (NORVASC) 2.5 MG tablet Take 2.5 mg by mouth every Monday, Wednesday, and Friday.    [provider]  brimonidine (ALPHAGAN)  0.2 % ophthalmic solution Place 1 drop into both eyes 3 (three) times daily.     [provider]  cefpodoxime (VANTIN) 200 MG tablet Take 2 tablets (400 mg total) by mouth daily. 07/17/16   Cristal Ford, DO  cholecalciferol 400 units tablet Take 1 tablet (400 Units total) by mouth daily. 07/18/16   Mikhail, Velta Addison, DO  dorzolamide-timolol (COSOPT) 22.3-6.8 MG/ML ophthalmic solution Place 1 drop into both eyes 2 (two) times daily.    [provider]  fluvoxaMINE (LUVOX) 25 MG tablet Take 25 mg by mouth at bedtime.    [provider]  latanoprost (XALATAN) 0.005 % ophthalmic solution Place 1 drop into both eyes at bedtime.     [provider]  levothyroxine (SYNTHROID, LEVOTHROID) 25 MCG tablet Take 12.5 mcg by mouth every Saturday.     [provider]  lovastatin (MEVACOR) 10 MG tablet Take 5 mg by mouth every evening.     [provider]  Melatonin 10 MG TABS Take 5 mg by mouth at bedtime.    [provider]  metroNIDAZOLE (FLAGYL) 500 MG tablet Take 1 tablet (500 mg total) by mouth 3 (three) times daily. 07/17/16   Mikhail, Velta Addison, DO  nebivolol (BYSTOLIC) 10 MG tablet Take 2.5 mg by mouth every evening.    [provider]  pilocarpine (PILOCAR) 4 % ophthalmic solution Place 1 drop into the left eye 4 (four)  times daily.     [provider]  predniSONE (DELTASONE) 5 MG tablet Take 5 mg by mouth daily with breakfast.     [provider]    Family History No family history on file.  Social History Social History   Tobacco Use  . Smoking status: Never Smoker  . Smokeless tobacco: Never Used  Substance Use Topics  . Alcohol use: No  . Drug use: No     Allergies   Codeine; Nsaids; Gabapentin; and Other   Review of Systems Review of Systems  Constitutional: Positive for activity change, appetite change and fatigue. Negative for chills, diaphoresis and fever.  HENT: Negative for congestion.     Respiratory: Negative for cough, chest tightness, shortness of breath and wheezing.   Cardiovascular: Negative for chest pain, palpitations and leg swelling.  Gastrointestinal: Negative for abdominal pain, constipation, diarrhea, nausea and vomiting.  Genitourinary: Negative for flank pain and frequency.  Musculoskeletal: Negative for back pain, neck pain and neck stiffness.  Neurological: Negative for weakness and headaches.  Psychiatric/Behavioral: Positive for confusion and hallucinations (chronic). Negative for agitation.  All other systems reviewed and are negative.    Physical Exam Updated Vital Signs BP 108/61 (BP Location: Left Arm)   Pulse 79   Temp 97.9 F (36.6 C) (Oral) Comment (Src): Good contact  Resp 16   SpO2 98%   Physical Exam  Constitutional: No distress.  HENT:  Head: Normocephalic.  Mouth/Throat: Oropharynx is clear and moist. No oropharyngeal exudate.  Eyes: Conjunctivae are normal. Pupils are equal, round, and reactive to light.  Cardiovascular: Normal rate and intact distal pulses.  No murmur heard. Pulmonary/Chest: Effort normal. No respiratory distress. She has no wheezes. She exhibits no tenderness.  Abdominal: She exhibits no distension. There is no tenderness.  Musculoskeletal: She exhibits no edema or tenderness.  Neurological: She is disoriented. She displays no tremor. No sensory deficit. She exhibits normal muscle tone. GCS eye subscore is 3. GCS verbal subscore is 4. GCS motor subscore is 6.  Somnolent but rousable to voice.   Skin: Capillary refill takes less than 2 seconds. She is not diaphoretic. No erythema.  Nursing note and vitals reviewed.    ED Treatments / Results  Labs (all labs ordered are listed, but only abnormal results are displayed) Labs Reviewed  CBC WITH DIFFERENTIAL/PLATELET - Abnormal; Notable for the following components:      Result Value   WBC 47.6 (*)    RBC 3.80 (*)    Platelets 124 (*)    Neutro Abs 40.9 (*)     Lymphs Abs 4.8 (*)    Monocytes Absolute 1.9 (*)    All other components within normal limits  COMPREHENSIVE METABOLIC PANEL - Abnormal; Notable for the following components:   Chloride 100 (*)    BUN 52 (*)    Creatinine, Ser 1.15 (*)    Calcium 8.2 (*)    Total Protein 6.0 (*)    Albumin 2.6 (*)    AST 61 (*)    Total Bilirubin 1.5 (*)    GFR calc non Af Amer 39 (*)    GFR calc Af Amer 46 (*)    All other components within normal limits  CK - Abnormal; Notable for the following components:   Total CK 405 (*)    All other components within normal limits  URINALYSIS, ROUTINE W REFLEX MICROSCOPIC - Abnormal; Notable for the following components:   Color, Urine AMBER (*)    APPearance HAZY (*)  Hgb urine dipstick MODERATE (*)    Ketones, ur 5 (*)    Protein, ur 30 (*)    Leukocytes, UA SMALL (*)    Bacteria, UA MANY (*)    All other components within normal limits  MAGNESIUM - Abnormal; Notable for the following components:   Magnesium 1.3 (*)    All other components within normal limits  URINE CULTURE  PROTIME-INR  LIPASE, BLOOD  BASIC METABOLIC PANEL  CBC  I-STAT CG4 LACTIC ACID, ED    EKG  EKG Interpretation  Date/Time:  Tuesday February 09 2017 18:27:48 EST Ventricular Rate:  81 PR Interval:    QRS Duration: 76 QT Interval:  431 QTC Calculation: 501 R Axis:   18 Text Interpretation:  Sinus rhythm Low voltage, extremity leads Probable anteroseptal infarct, old Borderline repolarization abnormality Minimal ST elevation, lateral leads Prolonged QT interval When comapred to prior, slightly longer QTc.  No STEMI Confirmed by Antony Blackbird [phone]) on 02/09/2017 6:46:02 PM       Radiology Dg Chest 2 View  Result Date: 02/09/2017 CLINICAL DATA:  Altered mental status. EXAM: CHEST  2 VIEW COMPARISON:  09/02/2015 FINDINGS: Study limited due to severe kyphosis. No confluent airspace consolidation. Cannot exclude patchy airspace opacity in the posterior left base.  No large effusion. Pulmonary vasculature is probably normal. There is a hiatal hernia. IMPRESSION: Limited study. No large effusion or lobar consolidation. Cannot exclude a patchy area of consolidation or atelectasis in the posterior left base. Moderate hiatal hernia. Electronically Signed   By: Andreas Newport M.D.   On: 02/09/2017 19:09   Ct Head Wo Contrast  Result Date: 02/09/2017 CLINICAL DATA:  83 year old female with failure to thrive. Left renal mass. Initial encounter. EXAM: CT HEAD WITHOUT CONTRAST TECHNIQUE: Contiguous axial images were obtained from the base of the skull through the vertex without intravenous contrast. COMPARISON:  None. FINDINGS: Brain: 1.4 cm hyperdense lesion posterior media left parietal lobe with surrounding vasogenic edema. Although intracranial hemorrhage cannot be excluded this lesion is suspicious for hemorrhagic metastatic disease given the surrounding vasogenic edema and additional finding of vasogenic edema within the right frontal and parietal lobe. Atrophy. Vascular: Vascular calcifications Skull: No acute abnormality Sinuses/Orbits: No acute orbital abnormality. Visualized paranasal sinuses are clear Other: Mastoid air cells and middle ear cavities are clear. IMPRESSION: Findings suspicious for hemorrhagic metastatic lesion within the left parietal lobe with surrounding vasogenic edema. Vasogenic edema also noted within the right frontal and parietal lobe. Contrast-enhanced MR or CT would prove helpful for further delineation. Upon reviewing patient's prior abdominal CTs, there is a mass within the left kidney which may represent renal cell carcinoma. Renal cell carcinoma intracranial metastatic lesions can be hemorrhagic. Primary intracranial hemorrhage unrelated to malignancy felt to be less likely consideration. These results were called by telephone at the time of interpretation on 02/09/2017 at 7:22 pm to Dr. Gilford Raid, who verbally acknowledged these results.  Electronically Signed   By: Genia Del M.D.   On: 02/09/2017 19:29    Procedures Procedures (including critical care time)  Medications Ordered in ED Medications  dexamethasone (DECADRON) injection 4 mg (4 mg Intravenous Given 02/09/17 2202)  bisacodyl (DULCOLAX) suppository 10 mg (not administered)  brimonidine (ALPHAGAN) 0.2 % ophthalmic solution 1 drop (not administered)  Vitamin D3 400 Units (not administered)  dorzolamide-timolol (COSOPT) 22.3-6.8 MG/ML ophthalmic solution 1 drop (not administered)  fluvoxaMINE (LUVOX) tablet 25 mg (not administered)  latanoprost (XALATAN) 0.005 % ophthalmic solution 1 drop (not administered)  levothyroxine (SYNTHROID,  LEVOTHROID) tablet 12.5 mcg (not administered)  Melatonin TABS 5 mg (not administered)  nebivolol (BYSTOLIC) tablet 2.5 mg (not administered)  ondansetron (ZOFRAN) tablet 4 mg (not administered)  pilocarpine (PILOCAR) 4 % ophthalmic solution 1 drop (not administered)  acetaminophen (TYLENOL) tablet 650 mg (not administered)    Or  acetaminophen (TYLENOL) suppository 650 mg (not administered)  ondansetron (ZOFRAN) tablet 4 mg (not administered)    Or  ondansetron (ZOFRAN) injection 4 mg (not administered)  0.9 %  sodium chloride infusion ( Intravenous New Bag/Given 02/10/17 0001)  sodium chloride 0.9 % bolus 1,000 mL (0 mLs Intravenous Stopped 02/09/17 2158)  fentaNYL (SUBLIMAZE) injection 12.5 mcg (12.5 mcg Intravenous Given 02/09/17 2203)     Initial Impression / Assessment and Plan / ED Course  I have reviewed the triage vital signs and the nursing notes.  Pertinent labs & imaging results that were available during my care of the patient were reviewed by me and considered in my medical decision making (see chart for details).     Joanne Anderson is a 83 y.o. female with a past medical history significant for dementia, hypertension, hyperlipidemia, hypothyroidism, peripheral neuropathies, glaucoma, and macular degeneration  as well as a left stasis dermatitis wound who presents for altered mental status.  Patient is accompanied by grandson and other family member who reports that patient has been rapidly declining in mental status over the last few days.  He reports that patient is at [facility] and over the last few days has had decrease and oral intake.  She is not eating or drinking.  She has had decrease in her mental status and is sleeping all the time.  He is concerned she may have an occult infection as she has had this with illnesses in the past.  He reports that she is not interacting like she normally does.  He is unsure she has had any changes in urination or bowel movements.  He reports she has not had a cough.  He says that there have been no reports of falls recently.  Reports that she has a history of urinary tract infections in the past.  She also has had hallucinations for several months but she is not consistent with medications given her not eating or drinking.  He reports that the nursing facility gave her 2 L of fluid yesterday to try and rehydrate her but she still appears dehydrated.  He denies other complaints.  On my exam, patient is frail and very thin.  Patient is disoriented to person, place, and time.  They report that although disorientation is her baseline, normally she is much more talkative and they are concerned about her somnolence.  Patient's lungs were clear on my exam.  Abdomen and chest were nontender.  Patient had tenderness in the left leg where she has a bandaged wound.  Wound was examined and there is a 4-5 cm ulcer that appears healing.  There is mild surrounding erythema.  No purulence seen.  Patient has a palpable pulse and can feel her toes.  Patient can also wiggle her feet.  Patient will have workup for altered mental status.  She will have imaging of the head as well as urinalysis and labs.  Patient was given fluids given the concern for dehydration with decreased oral intake.   Patient's blood pressure was 92 on my initial evaluation.  We will continue to monitor as we rehydrate.  Family is concerned that this is an acute change over several days and  not just a gradual decline.          7:41 PM Radiology called to report that they are concerned about a metastasis to the brain with hemorrhage and edema.  Radiology looked at old imaging and saw that there was a mass on the kidneys previously which may be a primary source.  Neurosurgery will be called and patient be admitted to the hospital for further management.  Neurosurgery requested patient receive steroids 4 mg every 6 hours and receive MRI with and without contrast to further evaluate.  They will see the patient in the morning and request she be admitted to the hospitalist service.  Laboratory testing also returned showing a leukocytosis.  Unclear if this is from the possible intracerebral metastasis, a oncologic etiology, or infection.  Urinalysis showed leukocytes and bacteria however will defer to hospitalist team for antibiotic management if they feel it necessary.  Patient will be admitted to hospitalist service for further management of the somnolence, new cerebral edema, and possible metastasis versus mass intracranially.   Final Clinical Impressions(s) / ED Diagnoses   Final diagnoses:  Altered mental status, unspecified altered mental status type  Somnolence  Cerebral edema (HCC)  Leukocytosis, unspecified type    ED Discharge Orders    None      Clinical Impression: 1. Altered mental status, unspecified altered mental status type   2. Somnolence   3. Cerebral edema (HCC)   4. Leukocytosis, unspecified type     Disposition: Admit  This note was prepared with assistance of Dragon voice recognition software. Occasional wrong-word or sound-a-like substitutions may have occurred due to the inherent limitations of voice recognition software.     Teofil Maniaci, Gwenyth Allegra, MD 02/10/17 [phone]

## 2017-02-09 NOTE — ED Notes (Signed)
Patient transported to MRI 

## 2017-02-09 NOTE — ED Notes (Signed)
Pt rolled to left side due to strong concern as she is laying in the same position and she is very thin.  Pt has swelling to right hand and significant bruising to right hand, cause unsure, in report I was told that it was secondary to IV placement at the SNF.  Pt denies any pain or distress from this.  Her fingers do appear pale.  Hand elevated.  Family state that MD has seen this and is aware.  Cap refill appears wnl but is difficult to evaluate due to pt complexion.

## 2017-02-09 NOTE — H&P (Addendum)
History and Physical    Joanne Anderson NFA:[id number] DOB: [birth year] DOA: 02/09/2017  PCP: Patient, No Pcp Per  Patient coming from: Skilled nursing facility.  Chief Complaint: Altered mental status.  HPI: Joanne Anderson is a 83 y.o. female with history of dementia hypertension hypothyroidism hyperlipidemia and stasis dermatitis of the lower extremity was brought to the ER the patient's family found the patient has been increasingly confused for last 3 days.  Patient also had hallucinations.  Did not record any fever.  ED Course: In the ER patient appeared confused.  CT of the head was done which shows features concerning for hemorrhagic metastasis of the left parietal lobe with vasogenic edema.  Patient has a known history of left renal mass.  At this time patient's family would like to proceed with further workup as discussed with the ER physician.  On-call neurosurgeon was consulted and patient was placed on Decadron and had MRI brain ordered which is pending.  Labs show leukocytosis and acute renal failure.  Blood pressure is in the low normal and was given fluid bolus in the ER.  Review of Systems: As per HPI, rest all negative.   Past Medical History:  Diagnosis Date  . Dementia   . Glaucoma   . Hyperlipidemia   . Hypertension   . Macular degeneration   . Peripheral neuropathy   . Stasis dermatitis of both legs    goes to the [facility]    Past Surgical History:  Procedure Laterality Date  . CHOLECYSTECTOMY    . EYE SURGERY    . HEMICOLECTOMY     remote     reports that  has never smoked. she has never used smokeless tobacco. She reports that she does not drink alcohol or use drugs.  Allergies  Allergen Reactions  . Codeine Anaphylaxis  . Nsaids Nausea And Vomiting  . Gabapentin Nausea And Vomiting  . Other Nausea And Vomiting and Other (See Comments)    Pt states that she is allergic to all pain medications.     Family History  Problem Relation Age of  Onset  . Hypertension Other     Prior to Admission medications   Medication Sig Start Date End Date Taking? Authorizing Provider  acetaminophen (TYLENOL) 500 MG tablet Take 500 mg by mouth every 6 (six) hours as needed for mild pain, moderate pain, fever or headache.   Yes [provider]  bisacodyl (DULCOLAX) 10 MG suppository Place 10 mg rectally daily as needed for mild constipation or moderate constipation.   Yes [provider]  brimonidine (ALPHAGAN) 0.2 % ophthalmic solution Place 1 drop into both eyes 3 (three) times daily.    Yes [provider]  cholecalciferol 400 units tablet Take 1 tablet (400 Units total) by mouth daily. 07/18/16  Yes Mikhail, Maryann, DO  dorzolamide-timolol (COSOPT) 22.3-6.8 MG/ML ophthalmic solution Place 1 drop into both eyes 2 (two) times daily.   Yes [provider]  fluvoxaMINE (LUVOX) 25 MG tablet Take 25 mg by mouth at bedtime.   Yes [provider]  latanoprost (XALATAN) 0.005 % ophthalmic solution Place 1 drop into both eyes at bedtime.    Yes [provider]  levothyroxine (SYNTHROID, LEVOTHROID) 25 MCG tablet Take 12.5 mcg by mouth every Saturday.    Yes [provider]  lovastatin (MEVACOR) 10 MG tablet Take 5 mg by mouth every evening.    Yes [provider]  Melatonin 10 MG TABS Take 5 mg  by mouth at bedtime.   Yes [provider]  mineral oil enema Place 1 enema rectally daily as needed for severe constipation.   Yes [provider]  nebivolol (BYSTOLIC) 10 MG tablet Take 2.5 mg by mouth every evening.   Yes [provider]  ondansetron (ZOFRAN) 4 MG tablet Take 4 mg by mouth every 6 (six) hours as needed for nausea or vomiting.   Yes [provider]  pilocarpine (PILOCAR) 4 % ophthalmic solution Place 1 drop into the left eye 4 (four) times daily.    Yes [provider]  predniSONE (DELTASONE) 5 MG tablet Take 5 mg by mouth daily with  breakfast.    Yes [provider]  cefpodoxime (VANTIN) 200 MG tablet Take 2 tablets (400 mg total) by mouth daily. Patient not taking: Reported on 02/09/2017 07/17/16   Cristal Ford, DO  metroNIDAZOLE (FLAGYL) 500 MG tablet Take 1 tablet (500 mg total) by mouth 3 (three) times daily. Patient not taking: Reported on 02/09/2017 07/17/16   Cristal Ford, DO    Physical Exam: Vitals:   02/09/17 1714 02/09/17 2200  BP: 108/61 120/67  Pulse: 79   Resp: 16 (!) 30  Temp: 97.9 F (36.6 C)   TempSrc: Oral   SpO2: 98%       Constitutional: Moderately built and nourished. Vitals:   02/09/17 1714 02/09/17 2200  BP: 108/61 120/67  Pulse: 79   Resp: 16 (!) 30  Temp: 97.9 F (36.6 C)   TempSrc: Oral   SpO2: 98%    Eyes: Anicteric no pallor. ENMT: No discharge from the ears eyes nose or mouth. Neck: No mass felt.  No neck rigidity. Respiratory: No rhonchi or crepitations. Cardiovascular: S1-S2 heard no murmurs appreciated. Abdomen: Soft nontender bowel sounds present. Musculoskeletal: Mild swelling of the right upper extremity. Skin: Multiple skin ecchymotic areas. Neurologic: Patient is confused responds to her name does not follow commands but moves all extremities. Psychiatric: Appears confused.   Labs on Admission: I have personally reviewed following labs and imaging studies  CBC: Recent Labs  Lab 02/09/17 1848  WBC 47.6*  NEUTROABS 40.9*  HGB 12.4  HCT 36.4  MCV 95.8  PLT 433*   Basic Metabolic Panel: Recent Labs  Lab 02/09/17 1848  NA 136  K 3.5  CL 100*  CO2 22  GLUCOSE 67  BUN 52*  CREATININE 1.15*  CALCIUM 8.2*  MG 1.3*   GFR: CrCl cannot be calculated (Unknown ideal weight.). Liver Function Tests: Recent Labs  Lab 02/09/17 1848  AST 61*  ALT 43  ALKPHOS 117  BILITOT 1.5*  PROT 6.0*  ALBUMIN 2.6*   Recent Labs  Lab 02/09/17 1848  LIPASE 20   No results for input(s): AMMONIA in the last 168 hours. Coagulation  Profile: Recent Labs  Lab 02/09/17 1848  INR 1.08   Cardiac Enzymes: Recent Labs  Lab 02/09/17 1848  CKTOTAL 405*   BNP (last 3 results) No results for input(s): PROBNP in the last 8760 hours. HbA1C: No results for input(s): HGBA1C in the last 72 hours. CBG: No results for input(s): GLUCAP in the last 168 hours. Lipid Profile: No results for input(s): CHOL, HDL, LDLCALC, TRIG, CHOLHDL, LDLDIRECT in the last 72 hours. Thyroid Function Tests: No results for input(s): TSH, T4TOTAL, FREET4, T3FREE, THYROIDAB in the last 72 hours. Anemia Panel: No results for input(s): VITAMINB12, FOLATE, FERRITIN, TIBC, IRON, RETICCTPCT in the last 72 hours. Urine analysis:    Component Value Date/Time  COLORURINE AMBER (A) 02/09/2017 1945   APPEARANCEUR HAZY (A) 02/09/2017 1945   LABSPEC 1.016 02/09/2017 1945   PHURINE 5.0 02/09/2017 1945   GLUCOSEU NEGATIVE 02/09/2017 1945   HGBUR MODERATE (A) 02/09/2017 1945   BILIRUBINUR NEGATIVE 02/09/2017 1945   KETONESUR 5 (A) 02/09/2017 1945   PROTEINUR 30 (A) 02/09/2017 1945   UROBILINOGEN 0.2 07/29/2007 2341   NITRITE NEGATIVE 02/09/2017 1945   LEUKOCYTESUR SMALL (A) 02/09/2017 1945   Sepsis Labs: @LABRCNTIP (procalcitonin:4,lacticidven:4) )No results found for this or any previous visit (from the past 240 hour(s)).   Radiological Exams on Admission: Dg Chest 2 View  Result Date: 02/09/2017 CLINICAL DATA:  Altered mental status. EXAM: CHEST  2 VIEW COMPARISON:  09/02/2015 FINDINGS: Study limited due to severe kyphosis. No confluent airspace consolidation. Cannot exclude patchy airspace opacity in the posterior left base. No large effusion. Pulmonary vasculature is probably normal. There is a hiatal hernia. IMPRESSION: Limited study. No large effusion or lobar consolidation. Cannot exclude a patchy area of consolidation or atelectasis in the posterior left base. Moderate hiatal hernia. Electronically Signed   By: Andreas Newport M.D.   On:  02/09/2017 19:09   Ct Head Wo Contrast  Result Date: 02/09/2017 CLINICAL DATA:  83 year old female with failure to thrive. Left renal mass. Initial encounter. EXAM: CT HEAD WITHOUT CONTRAST TECHNIQUE: Contiguous axial images were obtained from the base of the skull through the vertex without intravenous contrast. COMPARISON:  None. FINDINGS: Brain: 1.4 cm hyperdense lesion posterior media left parietal lobe with surrounding vasogenic edema. Although intracranial hemorrhage cannot be excluded this lesion is suspicious for hemorrhagic metastatic disease given the surrounding vasogenic edema and additional finding of vasogenic edema within the right frontal and parietal lobe. Atrophy. Vascular: Vascular calcifications Skull: No acute abnormality Sinuses/Orbits: No acute orbital abnormality. Visualized paranasal sinuses are clear Other: Mastoid air cells and middle ear cavities are clear. IMPRESSION: Findings suspicious for hemorrhagic metastatic lesion within the left parietal lobe with surrounding vasogenic edema. Vasogenic edema also noted within the right frontal and parietal lobe. Contrast-enhanced MR or CT would prove helpful for further delineation. Upon reviewing patient's prior abdominal CTs, there is a mass within the left kidney which may represent renal cell carcinoma. Renal cell carcinoma intracranial metastatic lesions can be hemorrhagic. Primary intracranial hemorrhage unrelated to malignancy felt to be less likely consideration. These results were called by telephone at the time of interpretation on 02/09/2017 at 7:22 pm to Dr. Gilford Raid, who verbally acknowledged these results. Electronically Signed   By: Genia Del M.D.   On: 02/09/2017 19:29    EKG: Independently reviewed.  Normal sinus rhythm low voltage.  Assessment/Plan Principal Problem:   Brain mass Active Problems:   Essential hypertension   Hypothyroidism    1. Possible hemorrhagic metastatic brain lesions with vasogenic  edema -neurosurgery was consulted and patient has been placed on Decadron.  MRI brain is pending.  Patient has a known history of left renal mass which could be the primary.  Will need further workup with CT scan of the chest abdomen pelvis which should be done with contrast and this can be done after creatinine improves after hydration. 2. Acute encephalopathy -likely from #1 and also possible infection.  Patient does not appear to be in seizures.  Responds to her name. 3. Leukocytosis -patient chest x-ray shows possible pneumonia and also UA shows possible UTI.  For now we will keep patient on empiric antibiotics and check cultures.  Follow CBC. 4. Acute renal failure -probably prerenal.  Patient also was mildly hypotensive in the ER for which patient was given fluid bolus.  Gently hydrate and recheck metabolic panel in the a.m. 5. Hypertension -we will hold Bystolic due to patient having low normal blood pressure. 6. Hypothyroidism on Synthroid. 7. History of hyperlipidemia we will hold statins due to mildly elevated CK levels. 8. Possible UTI -patient on empiric antibiotics. 9. Right upper extremity swelling -check Dopplers to rule out DVT. 10. Chronic skin ecchymotic areas and stasis dermatitis. 11. History of dementia.  Patient is encephalopathic we will hold oral medications and diet for now and get swallow evaluation.  I have changed Synthroid IV and holding Bystolic due to low normal blood pressure.  DVT prophylaxis: SCDs and CT scan was showing possible hemorrhagic metastasis. Code Status: DNR. Family Communication: No family at the bedside. Disposition Plan: Skilled nursing facility. Consults called: Neurosurgery. Admission status: Observation.   Rise Patience MD [facility] Pager [phone].  If 7PM-7AM, please contact night-coverage [email or web address] Password [email or web address]  02/09/2017, 11:01 PM

## 2017-02-09 NOTE — ED Triage Notes (Signed)
Patient coming from [facility] with c/o failure to thieve. Pt only complain of right side tenderness. Pt denies any N/V. CBG 289

## 2017-02-10 ENCOUNTER — Observation Stay (HOSPITAL_BASED_OUTPATIENT_CLINIC_OR_DEPARTMENT_OTHER): Payer: Medicare Other

## 2017-02-10 ENCOUNTER — Other Ambulatory Visit: Payer: Self-pay

## 2017-02-10 DIAGNOSIS — I10 Essential (primary) hypertension: Secondary | ICD-10-CM | POA: Diagnosis not present

## 2017-02-10 DIAGNOSIS — F0391 Unspecified dementia with behavioral disturbance: Secondary | ICD-10-CM | POA: Diagnosis not present

## 2017-02-10 DIAGNOSIS — R64 Cachexia: Secondary | ICD-10-CM | POA: Diagnosis present

## 2017-02-10 DIAGNOSIS — G939 Disorder of brain, unspecified: Secondary | ICD-10-CM | POA: Diagnosis not present

## 2017-02-10 DIAGNOSIS — G936 Cerebral edema: Secondary | ICD-10-CM | POA: Diagnosis not present

## 2017-02-10 DIAGNOSIS — M7989 Other specified soft tissue disorders: Secondary | ICD-10-CM | POA: Diagnosis not present

## 2017-02-10 DIAGNOSIS — M79609 Pain in unspecified limb: Secondary | ICD-10-CM

## 2017-02-10 DIAGNOSIS — R4182 Altered mental status, unspecified: Secondary | ICD-10-CM | POA: Diagnosis not present

## 2017-02-10 DIAGNOSIS — Z515 Encounter for palliative care: Secondary | ICD-10-CM

## 2017-02-10 DIAGNOSIS — N179 Acute kidney failure, unspecified: Secondary | ICD-10-CM | POA: Diagnosis present

## 2017-02-10 DIAGNOSIS — G92 Toxic encephalopathy: Secondary | ICD-10-CM | POA: Diagnosis present

## 2017-02-10 DIAGNOSIS — C7931 Secondary malignant neoplasm of brain: Secondary | ICD-10-CM | POA: Diagnosis present

## 2017-02-10 DIAGNOSIS — C801 Malignant (primary) neoplasm, unspecified: Secondary | ICD-10-CM | POA: Diagnosis not present

## 2017-02-10 DIAGNOSIS — N390 Urinary tract infection, site not specified: Secondary | ICD-10-CM | POA: Diagnosis present

## 2017-02-10 DIAGNOSIS — I959 Hypotension, unspecified: Secondary | ICD-10-CM | POA: Diagnosis present

## 2017-02-10 DIAGNOSIS — Z7189 Other specified counseling: Secondary | ICD-10-CM

## 2017-02-10 DIAGNOSIS — N2889 Other specified disorders of kidney and ureter: Secondary | ICD-10-CM | POA: Diagnosis present

## 2017-02-10 DIAGNOSIS — I611 Nontraumatic intracerebral hemorrhage in hemisphere, cortical: Secondary | ICD-10-CM | POA: Diagnosis present

## 2017-02-10 DIAGNOSIS — H409 Unspecified glaucoma: Secondary | ICD-10-CM | POA: Diagnosis not present

## 2017-02-10 DIAGNOSIS — E43 Unspecified severe protein-calorie malnutrition: Secondary | ICD-10-CM | POA: Diagnosis present

## 2017-02-10 DIAGNOSIS — R7881 Bacteremia: Secondary | ICD-10-CM | POA: Diagnosis present

## 2017-02-10 DIAGNOSIS — S062X9D Diffuse traumatic brain injury with loss of consciousness of unspecified duration, subsequent encounter: Secondary | ICD-10-CM | POA: Diagnosis not present

## 2017-02-10 DIAGNOSIS — Z66 Do not resuscitate: Secondary | ICD-10-CM | POA: Diagnosis present

## 2017-02-10 DIAGNOSIS — E860 Dehydration: Secondary | ICD-10-CM | POA: Diagnosis present

## 2017-02-10 DIAGNOSIS — B9620 Unspecified Escherichia coli [E. coli] as the cause of diseases classified elsewhere: Secondary | ICD-10-CM | POA: Diagnosis present

## 2017-02-10 DIAGNOSIS — E785 Hyperlipidemia, unspecified: Secondary | ICD-10-CM | POA: Diagnosis present

## 2017-02-10 DIAGNOSIS — J690 Pneumonitis due to inhalation of food and vomit: Secondary | ICD-10-CM | POA: Diagnosis not present

## 2017-02-10 DIAGNOSIS — R339 Retention of urine, unspecified: Secondary | ICD-10-CM | POA: Diagnosis present

## 2017-02-10 DIAGNOSIS — H3530 Unspecified macular degeneration: Secondary | ICD-10-CM | POA: Diagnosis present

## 2017-02-10 DIAGNOSIS — Z681 Body mass index (BMI) 19 or less, adult: Secondary | ICD-10-CM | POA: Diagnosis not present

## 2017-02-10 DIAGNOSIS — E039 Hypothyroidism, unspecified: Secondary | ICD-10-CM | POA: Diagnosis not present

## 2017-02-10 LAB — BASIC METABOLIC PANEL
ANION GAP: 14 (ref 5–15)
BUN: 48 mg/dL — AB (ref 6–20)
CO2: 19 mmol/L — ABNORMAL LOW (ref 22–32)
CREATININE: 0.89 mg/dL (ref 0.44–1.00)
Calcium: 7.9 mg/dL — ABNORMAL LOW (ref 8.9–10.3)
Chloride: 106 mmol/L (ref 101–111)
GFR calc Af Amer: >60 mL/min (ref >60)
GFR, EST NON AFRICAN AMERICAN: 54 mL/min — AB (ref >60)
Glucose, Bld: 70 mg/dL (ref 65–99)
POTASSIUM: 4 mmol/L (ref 3.5–5.1)
SODIUM: 139 mmol/L (ref 135–145)

## 2017-02-10 LAB — CBC
HCT: 38.4 % (ref 36.0–46.0)
Hemoglobin: 13.3 g/dL (ref 12.0–15.0)
MCH: 33.3 pg (ref 26.0–34.0)
MCHC: 34.6 g/dL (ref 30.0–36.0)
MCV: 96.2 fL (ref 78.0–100.0)
PLATELETS: 105 10*3/uL — AB (ref 150–400)
RBC: 3.99 MIL/uL (ref 3.87–5.11)
RDW: 15 % (ref 11.5–15.5)
WBC: 27.1 10*3/uL — AB (ref 4.0–10.5)

## 2017-02-10 LAB — GLUCOSE, CAPILLARY
Glucose-Capillary: 168 mg/dL — ABNORMAL HIGH (ref 65–99)
Glucose-Capillary: 120 mg/dL — ABNORMAL HIGH (ref 65–99)

## 2017-02-10 LAB — MRSA PCR SCREENING: MRSA by PCR: POSITIVE — AB

## 2017-02-10 MED ORDER — DEXAMETHASONE SODIUM PHOSPHATE 4 MG/ML IJ SOLN
4.0000 mg | Freq: Four times a day (QID) | INTRAMUSCULAR | Status: DC
  Administered 2017-02-10 – 2017-02-11 (×5): 4 mg via INTRAVENOUS
  Filled 2017-02-10 (×6): qty 1

## 2017-02-10 MED ORDER — FLUVOXAMINE MALEATE 50 MG PO TABS
25.0000 mg | ORAL_TABLET | Freq: Every day | ORAL | Status: DC
  Filled 2017-02-10: qty 1

## 2017-02-10 MED ORDER — MUPIROCIN 2 % EX OINT
1.0000 "application " | TOPICAL_OINTMENT | Freq: Two times a day (BID) | CUTANEOUS | Status: DC
  Administered 2017-02-10 – 2017-02-11 (×3): 1 via NASAL
  Filled 2017-02-10: qty 22

## 2017-02-10 MED ORDER — PIPERACILLIN-TAZOBACTAM 3.375 G IVPB 30 MIN
3.3750 g | Freq: Once | INTRAVENOUS | Status: AC
  Administered 2017-02-10: 3.375 g via INTRAVENOUS
  Filled 2017-02-10: qty 50

## 2017-02-10 MED ORDER — PIPERACILLIN-TAZOBACTAM 3.375 G IVPB
3.3750 g | Freq: Three times a day (TID) | INTRAVENOUS | Status: DC
  Administered 2017-02-10 – 2017-02-11 (×4): 3.375 g via INTRAVENOUS
  Filled 2017-02-10 (×5): qty 50

## 2017-02-10 MED ORDER — CHLORHEXIDINE GLUCONATE CLOTH 2 % EX PADS
6.0000 | MEDICATED_PAD | Freq: Every day | CUTANEOUS | Status: DC
  Administered 2017-02-11: 6 via TOPICAL

## 2017-02-10 MED ORDER — DEXTROSE 5 % IV SOLN
1.0000 g | INTRAVENOUS | Status: DC
  Filled 2017-02-10: qty 10

## 2017-02-10 MED ORDER — LEVOTHYROXINE SODIUM 100 MCG IV SOLR: 6.2500 ug | INTRAVENOUS | Status: DC

## 2017-02-10 MED ORDER — VANCOMYCIN HCL 500 MG IV SOLR
500.0000 mg | INTRAVENOUS | Status: DC
  Administered 2017-02-10: 500 mg via INTRAVENOUS
  Filled 2017-02-10: qty 500

## 2017-02-10 NOTE — Progress Notes (Signed)
PHARMACIST - PHYSICIAN ORDER COMMUNICATION  CONCERNING: P&T Medication Policy on Herbal Medications  DESCRIPTION:  This patient's order for:  Melatonin  has been noted.  This product(s) is classified as an "herbal" or natural product. Due to a lack of definitive safety studies or FDA approval, nonstandard manufacturing practices, plus the potential risk of unknown drug-drug interactions while on inpatient medications, the Pharmacy and Therapeutics Committee does not permit the use of "herbal" or natural products of this type within [facility].   ACTION TAKEN: The pharmacy department is unable to verify this order at this time and your patient has been informed of this safety policy. Please reevaluate patient's clinical condition at discharge and address if the herbal or natural product(s) should be resumed at that time.  Minda Ditto PharmD Pager [phone] 02/10/2017, 4:22 AM

## 2017-02-10 NOTE — Care Management Note (Signed)
Case Management Note  Patient Details  Name: Joanne Anderson MRN: [id number] Date of Birth: 1921-01-24  Subjective/Objective:                  AMS/  Action/Plan: Date: February 10, 2017 Velva Harman, BSN, [place], [place] Chart and notes review for patient progress and needs. Will follow for case management and discharge needs. Next review date: [id number]  Expected Discharge Date:                  Expected Discharge Plan:  Home/Self Care  In-House Referral:  Clinical Social Work  Discharge planning Services  CM Consult  Post Acute Care Choice:    Choice offered to:     DME Arranged:    DME Agency:     HH Arranged:    [place] Agency:     Status of Service:  In process, will continue to follow  If discussed at Long Length of Stay Meetings, dates discussed:    Additional Comments:  Leeroy Cha, RN 02/10/2017, 8:42 AM

## 2017-02-10 NOTE — Progress Notes (Signed)
[facility] requesting that MD change order to with sedation at [facility], if MRI is to completed.  Spoke with Dr. Charlies Silvers, who stated that was fine.  MRI order changed to with sedation.  Spoke with [facility], who requested patient be transferred to [facility].  Notified Dr. Charlies Silvers of MRI's request.  Dr. Charlies Silvers stated that it would be reasonable to wait for MRI, until palliative goc meetting had been completed.  [facility] notified, that MRI is on hold for now.

## 2017-02-10 NOTE — Progress Notes (Signed)
PHARMACY NOTE -  ANTIBIOTIC RENAL DOSE ADJUSTMENT   Request received for Pharmacy to assist with antibiotic renal dose adjustment.  Patient has been initiated on Ceftriaxone 1gm IV q24 for UTI. SCr 0.89, estimated CrCl 40 ml/min Current dosage is appropriate and need for further dosage adjustment appears unlikely at present. Will sign off at this time.  Please reconsult if a change in clinical status warrants re-evaluation of dosage.  Minda Ditto PharmD Pager [phone] 02/10/2017, 6:12 AM

## 2017-02-10 NOTE — Progress Notes (Signed)
Contacted by MRI that MRI of head was attempted last night before patient brought to floor but was unable to be completed due to patient positioning. Patient is unable to lay flat, cannot straighten her back out and could not be positioned enough for the MRI to be completed. MRI tech reports they attempted several positions with pillows and still could not complete. Requests to notify physician that MRI could not be completed and only other option is to complete under anesthesia. Will notify provider on call.

## 2017-02-10 NOTE — Progress Notes (Signed)
Reviewed palliative care note. Palliative care met with grandson, HCPOA.  Per note by Elicia Lamp, NP "We discussed his understanding of patient's illness and decision about proceeding with MRI. He does not want to proceed with MRI and transfer to [facility]. He tells me that when the patient was diagnosed with the renal mass (5 years ago?), she did not want to pursue further work up or treatment at that point so he feels she would desire the same for this situation."  No role for NSY in care. Will sign off. Please call for any concerns.

## 2017-02-10 NOTE — Progress Notes (Signed)
Unable to complete admission, no family present. Will pass along to dayshift.

## 2017-02-10 NOTE — Progress Notes (Signed)
Patient arrived to room 1511. Patient is drowsy, but arousable and oriented to self only. Patient has multiple bruises to arms, legs, right neck. Has skin tear to left leg, MD saw and ordered dressing to be placed in ED and WOC consult. Patinet has redness to heels and scab area x2 to right heel. Also, redness to sacrum. Foam applied to heels and sacral area. Patient is not accompanied by anyone, unable to complete admission due to patient is not oriented and unable to answer questions. Patient has left arm IV site with coban wrap that is saline locked as well as left AC IV with NS infusing at 50. Patient mouth very dry, attemped to swab but patient refusing and will not open mouth. Right hand is bruised and edematous and per ED nurse was present at admit although family is concerned about appearance so doppler is ordered for today. Patient unable to be oriented to bed use, bed alarm placed. Patient is resting comfortably, sleeping quietly. Will ct monitor.

## 2017-02-10 NOTE — Progress Notes (Signed)
Pharmacy Antibiotic Note  Joanne Anderson is a 83 y.o. female admitted on 02/09/2017 with pneumonia.  Pharmacy has been consulted for Vancomycin and Zosyn dosing.  Plan: Zosyn 3.375g IV q8h (4 hour infusion).  Begin Vancomycin at 500mg  q48h Daily SCr     Temp (24hrs), Avg:97.7 F (36.5 C), Min:97.5 F (36.4 C), Max:97.9 F (36.6 C)  Recent Labs  Lab 02/09/17 1846 02/09/17 1848 02/10/17 0510  WBC  --  47.6*  --   CREATININE  --  1.15* 0.89  LATICACIDVEN 1.78  --   --     CrCl cannot be calculated (Unknown ideal weight.).    Allergies  Allergen Reactions  . Codeine Anaphylaxis  . Nsaids Nausea And Vomiting  . Gabapentin Nausea And Vomiting  . Other Nausea And Vomiting and Other (See Comments)    Pt states that she is allergic to all pain medications.    Antimicrobials this admission: 12/12 Zosyn >>  12/12 Vancomycin  >>   Dose adjustments this admission:  Microbiology results: 12/11 UCx: sent  12/11 MRSA PCR: sent  Thank you for allowing pharmacy to be a part of this patient's care.  Minda Ditto 02/10/2017 6:17 AM

## 2017-02-10 NOTE — Progress Notes (Signed)
Right upper extremity venous duplex completed. Technically difficult to the size of the patient and veins. No obvious evidence of a DVT or superficial thrombosis. Arnett Jessina Marse,RVS 02/09/2017.10:49 AM

## 2017-02-10 NOTE — Consult Note (Signed)
[place] Nurse wound consult note Reason for Consult:frail elderly patient with history of 5 compression fractures to kyphotic spine.  Currently with skin avulsion to the left LE. No heel breakdown. No sacral breakdown as she lies on her sides while in bed. Weight is 75lbs. Grandson assists with history.  He cared for her in her home until recently when she was admitted to an SNF. Wound type:Skin tear. Pressure Injury POA: N/A Measurement:10cm x 10cm with irregular borders, 0.2cm depth. Wound YIF:OYDXA red, bloody residuals Drainage (amount, consistency, odor) scant serosanguinous Periwound:bruised. No edema Dressing procedure/placement/frequency:I will provide a mattress replacement for comfort and pressure redistribution.  Topical care with xeroform for antimicrobial and astringent properties is recommended. [place] nursing team will not follow, but will remain available to this patient, the nursing and medical teams.  Please re-consult if needed. Thanks, Maudie Flakes, MSN, RN, [place], Arther Abbott  Pager# [phone]

## 2017-02-10 NOTE — ED Notes (Signed)
Dr. Raliegh Ip, admitting MD came to the bedside and I spoke with him about pt multiple bruises and red areas and her wound on her left lower leg (which I wrapped earlier with xeroform gauze and wrapped gauze)  I looked at the right hand which is swollen and advised him that family told me that this is new.

## 2017-02-10 NOTE — ED Notes (Signed)
Bed assigned @ [id number]

## 2017-02-10 NOTE — Progress Notes (Addendum)
Patient ID: Joanne Anderson, female   DOB: 07/28/1920, 83 y.o.   MRN: [id number]  PROGRESS NOTE    Joanne Anderson  IOX:[id number] DOB: [birth year] DOA: 02/09/2017  PCP: Patient, No Pcp Per   Brief Narrative:  83 year old female with dementia, hypothyroidism who presented to ED with worsening mental status changes and increasing confusion. CT head concerning for hemorrhagic metastatic lesion. Per neurosurgery, pt needs MRI brian which needs to be done under sedation in [facility]. PCT consulted for [place].   Assessment & Plan:   Principal Problem:   Brain mass / Hemorrhagic metastatic lesion within the left parietal lobe with surrounding vasogenic edema - CT head showed findings suspicious for hemorrhagic metastatic lesion within the left parietal lobe with surrounding vasogenic edema. Vasogenic edema also noted within the right frontal and parietal lobe. There is a mass within the left kidney which may represent renal cell carcinoma. Renal cell carcinoma intracranial metastatic lesions can be hemorrhagic. Primary intracranial hemorrhage unrelated to malignancy felt to be less likely consideration. - Continue decadron  - Palliative consulted for goals of care  - We will await PCT recommendations prior to obtaining MRI brain  Active Problems: Left base pneumonia - Patchy consolidation seen on CXR in the left base - On vanco and zosyn    Hypothyroidism - Continue Synthroid    DVT prophylaxis: SCD's Code Status: DNR/DNI  Family Communication: no family at the bedside  Disposition Plan: not stable for discharge    Consultants:   Palliative care   Neurosurgery   Procedures:   None   Antimicrobials:   Vanco and zosyn 02/09/2017 -->   Subjective: No overnight events.  Objective: Vitals:   02/10/17 0308 02/10/17 0309 02/10/17 0407 02/10/17 0626  BP: 123/63  (!) 98/57   Pulse:   71   Resp: 18  18   Temp:   (!) 97.5 F (36.4 C)   TempSrc:   Axillary   SpO2:  93% 100%   Weight:     34.1 kg (75 lb 2.8 oz)    Intake/Output Summary (Last 24 hours) at 02/10/2017 2683 Last data filed at 02/10/2017 0700 Gross per 24 hour  Intake 1388.33 ml  Output 600 ml  Net 788.33 ml   Filed Weights   02/10/17 0626  Weight: 34.1 kg (75 lb 2.8 oz)    Examination:  General exam: Appears ill, no distress  Respiratory system: Diminished breath sounds, no wheezing  Cardiovascular system: S1 & S2 heard, Rate controlled  Gastrointestinal system: (+) BS, non tender abd Central nervous system: Pt responds some to verbal stimuli  Extremities: No edema, no tenderness  Skin: LE stasis dermatitis Psychiatry: Not restless or agitated   Data Reviewed: I have personally reviewed following labs and imaging studies  CBC: Recent Labs  Lab 02/09/17 1848 02/10/17 0510  WBC 47.6* 27.1*  NEUTROABS 40.9*  --   HGB 12.4 13.3  HCT 36.4 38.4  MCV 95.8 96.2  PLT 124* 419*   Basic Metabolic Panel: Recent Labs  Lab 02/09/17 1848 02/10/17 0510  NA 136 139  K 3.5 4.0  CL 100* 106  CO2 22 19*  GLUCOSE 67 70  BUN 52* 48*  CREATININE 1.15* 0.89  CALCIUM 8.2* 7.9*  MG 1.3*  --    GFR: CrCl cannot be calculated (Unknown ideal weight.). Liver Function Tests: Recent Labs  Lab 02/09/17 1848  AST 61*  ALT 43  ALKPHOS 117  BILITOT 1.5*  PROT 6.0*  ALBUMIN 2.6*  Recent Labs  Lab 02/09/17 1848  LIPASE 20   No results for input(s): AMMONIA in the last 168 hours. Coagulation Profile: Recent Labs  Lab 02/09/17 1848  INR 1.08   Cardiac Enzymes: Recent Labs  Lab 02/09/17 1848  CKTOTAL 405*   BNP (last 3 results) No results for input(s): PROBNP in the last 8760 hours. HbA1C: No results for input(s): HGBA1C in the last 72 hours. CBG: No results for input(s): GLUCAP in the last 168 hours. Lipid Profile: No results for input(s): CHOL, HDL, LDLCALC, TRIG, CHOLHDL, LDLDIRECT in the last 72 hours. Thyroid Function Tests: No results for input(s): TSH, T4TOTAL, FREET4, T3FREE,  THYROIDAB in the last 72 hours. Anemia Panel: No results for input(s): VITAMINB12, FOLATE, FERRITIN, TIBC, IRON, RETICCTPCT in the last 72 hours. Urine analysis:    Component Value Date/Time   COLORURINE AMBER (A) 02/09/2017 1945   APPEARANCEUR HAZY (A) 02/09/2017 1945   LABSPEC 1.016 02/09/2017 1945   PHURINE 5.0 02/09/2017 1945   GLUCOSEU NEGATIVE 02/09/2017 1945   HGBUR MODERATE (A) 02/09/2017 1945   BILIRUBINUR NEGATIVE 02/09/2017 1945   KETONESUR 5 (A) 02/09/2017 1945   PROTEINUR 30 (A) 02/09/2017 1945   UROBILINOGEN 0.2 07/29/2007 2341   NITRITE NEGATIVE 02/09/2017 1945   LEUKOCYTESUR SMALL (A) 02/09/2017 1945   Sepsis Labs: @LABRCNTIP (procalcitonin:4,lacticidven:4)   )No results found for this or any previous visit (from the past 240 hour(s)).    Radiology Studies: Dg Chest 2 View Result Date: 02/09/2017 Limited study. No large effusion or lobar consolidation. Cannot exclude a patchy area of consolidation or atelectasis in the posterior left base. Moderate hiatal hernia.   Ct Head Wo Contrast Result Date: 02/09/2017 Findings suspicious for hemorrhagic metastatic lesion within the left parietal lobe with surrounding vasogenic edema. Vasogenic edema also noted within the right frontal and parietal lobe. Contrast-enhanced MR or CT would prove helpful for further delineation. Upon reviewing patient's prior abdominal CTs, there is a mass within the left kidney which may represent renal cell carcinoma. Renal cell carcinoma intracranial metastatic lesions can be hemorrhagic. Primary intracranial hemorrhage unrelated to malignancy felt to be less likely consideration.     Scheduled Meds: . cholecalciferol  400 Units Oral Daily  . dexamethasone  4 mg Intravenous Q6H  . fluvoxaMINE  25 mg Oral QHS  .  levothyroxine  6.25 mcg Intravenous Once per Sat   Continuous Infusions: . sodium chloride 50 mL/hr at 02/10/17 0001  . piperacillin-tazobactam (ZOSYN)  IV    . vancomycin 500  mg (02/10/17 0816)     LOS: 0 days    Time spent: 25 minutes  Greater than 50% of the time spent on counseling and coordinating the care.   Leisa Lenz, MD [facility] Pager [phone]  If 7PM-7AM, please contact night-coverage [email or web address] Password [facility] 02/10/2017, 8:52 AM

## 2017-02-10 NOTE — Progress Notes (Addendum)
Late entry  Received call yesterday evening from EDP, Dr Sherry Ruffing in regards to patient's abnormal CT head. CT scan shows ?hemorrhagic met left parietal lobe with surrounding vasogenic edema. There is concern over RCC based on abnormal mass left kidney. Patient was admitted under hospitalist service for MRI brain and further work up. Patient unable to have MRI brain and will need to have it under sedation. Patient will need this done at [facility], rec transfer to hospitalist service at [facility]. Decadron 32m q 6 hours for edema. Pt with history of dementia. She is DNR/DNI. She should have palliative care discussion to determine goals of care, as MRI may not be necessary if family is not going to pursue any intensive intervention. Attending Dr BMarland KitchenDitty made aware.

## 2017-02-10 NOTE — Evaluation (Signed)
SLP Cancellation Note  Patient Details Name: TORRIA FROMER MRN: [id number] DOB: [birth year]   Cancelled treatment:       Reason Eval/Treat Not Completed: Other (comment);Fatigue/lethargy limiting ability to participate   Macario Golds 02/10/2017, 11:59 AM   Luanna Salk, [place] [facility] SLP [phone]

## 2017-02-10 NOTE — ED Notes (Signed)
Pt changed, she had rolled to her back.  Pt had smear of BM, cleaned with peri spray.  Placed a pure wick in place for pt.  Also placed a allevylle pad on sacrum as pt had a deep red spot there and positioned pt to the left side with pillows for comfort.  Sheets placed between legs to pad her while laying on her side.  Mouth care done.  Offered pt drink and she refused

## 2017-02-10 NOTE — Consult Note (Addendum)
Consultation Note Date: 02/10/2017   Patient Name: Joanne Anderson  DOB: 06/25/1920  MRN: [id number]  Age / Sex: 83 y.o., female  PCP: Wenda Low, MD Referring Physician: Robbie Lis, MD  Reason for Consultation: Establishing goals of care  HPI/Patient Profile: 83 y.o. female  with past medical history of dementia, HTN, HLD, peripheral neuropathy, and hypothyridism admitted on 02/09/2017 with 3 days of worsening mental status and decreased oral intake. She is admitted from SNF - [facility].  WBC 27.1, CXR shows possible LLL pna, and UA shows possible UTI. Head CT suspicious for hemorrhagic metastatic lesion within the left parietal lobe with surrounding vasogenic edema. Prior abdominal CT showed mass within left kidney which may represent renal cell carcinoma. Primary intracranial hemorrhage unrelated to malignancy felt to be less likely consideration.  PMT consulted for goals of care. Transfer to [facility] for MRI w/sedation is next step in care if aggressive measures are desired by family. Transfer on hold to determine goals of care.   Clinical Assessment and Goals of Care: Frail lady in hospital bed, opens her eyes when spoken to and mutters incomprehensible speech. Does not follow commands. Appears to be comfortable and resting.   Met w/grandson at bedside. He describes the last year of the patient's life as a decline in cognition, nutrition, and functional status. For the past year the patient has been wheelchair bound, not eating much, and unable to participate in conversations. He believes she hallucinates often. She still recognizes him and some other family members. She has lived at [facility] since May, prior she lived with grandson.   Patient's son is legal HCPOA, however, grandson tells me he is not involved in her care "they don't have much to do with him". Discussed that legally we would need to include him in decision making process so he is  reaching out to him to update him on situation and will provide me with his contact information. He tells me he has made all of her medical decisions at the SNF.   We discussed his understanding of patient's illness and decision about proceeding with MRI. He does not want to proceed with MRI and transfer to [facility]. He tells me that when the patient was diagnosed with the renal mass (5 years ago?), she did not want to pursue further work up or treatment at that point so he feels she would desire the same for this situation.  We discussed plan of care moving forward - introduced the idea of Hospice and discussed Hospice care at the facility or residential hospice depending on clinical course.  Addendum: Spoke with son who is HCPOA - described clinical situation in depth. Answered all questions. He is in agreement w/grandson to not proceed with MRI. Wants to continue to treat the treatable - UTI and pna, continue steroids, and see how patient responds. Introduced hospice support to him as well to follow at [facility]. His name is Dianely Krehbiel and # is [phone]  Primary Decision Maker NEXT OF KIN son Antony Haste    SUMMARY OF RECOMMENDATIONS   - no transfer to [facility] - no MRI -will follow up with son who is legal HCPOA to confirm decision - if patient able to return to SNF - will most likely invoke hospice benefit -possibly eligible for residential hospice depending on clinical course  Addendum: after conversation with son/HCPOA - no MRI, treat the treatable - UTI and pneumonia, continue steroids and see how patient responds  Code Status/Advance Care Planning:  DNR  Symptom Management:   N/A  Palliative Prophylaxis:   Aspiration, Delirium Protocol, Frequent Pain Assessment, Oral Care and Turn Reposition  Additional Recommendations (Limitations, Scope, Preferences):  No MRI, no transfer, DNR  Psycho-social/Spiritual:   Desire for further Chaplaincy support:no  Additional Recommendations: Education  on Hospice  Prognosis:   Unable to determine, at least <6 months, most likely much shorter  Discharge Planning: To Be Determined      Primary Diagnoses: Present on Admission: . Brain mass . Essential hypertension . Hypothyroidism   I have reviewed the medical record, interviewed the patient and family, and examined the patient. The following aspects are pertinent.  Past Medical History:  Diagnosis Date  . Dementia   . Glaucoma   . Hyperlipidemia   . Hypertension   . Macular degeneration   . Peripheral neuropathy   . Stasis dermatitis of both legs    goes to the [employer] History   Socioeconomic History  . Marital status: Widowed    Spouse name: None  . Number of children: None  . Years of education: None  . Highest education level: None  Social Needs  . Financial resource strain: None  . Food insecurity - worry: None  . Food insecurity - inability: None  . Transportation needs - medical: None  . Transportation needs - non-medical: None  Occupational History  . Occupation: retired  Tobacco Use  . Smoking status: Never Smoker  . Smokeless tobacco: Never Used  Substance and Sexual Activity  . Alcohol use: No  . Drug use: No  . Sexual activity: None  Other Topics Concern  . None  Social History Narrative  . None   Family History  Problem Relation Age of Onset  . Hypertension Other    Scheduled Meds: . brimonidine  1 drop Both Eyes TID  . cholecalciferol  400 Units Oral Daily  . dexamethasone  4 mg Intravenous Q6H  . dorzolamide-timolol  1 drop Both Eyes BID  . fluvoxaMINE  25 mg Oral QHS  . latanoprost  1 drop Both Eyes QHS  . [START ON 02/13/2017] levothyroxine  6.25 mcg Intravenous Once per day on Sat  . pilocarpine  1 drop Left Eye QID   Continuous Infusions: . sodium chloride 50 mL/hr at 02/10/17 0001  . piperacillin-tazobactam (ZOSYN)  IV    . vancomycin 500 mg (02/10/17 0816)   PRN Meds:.acetaminophen **OR**  acetaminophen, bisacodyl, ondansetron **OR** ondansetron (ZOFRAN) IV Allergies  Allergen Reactions  . Codeine Anaphylaxis  . Nsaids Nausea And Vomiting  . Gabapentin Nausea And Vomiting  . Other Nausea And Vomiting and Other (See Comments)    Pt states that she is allergic to all pain medications.    Review of Systems  Unable to perform ROS: Dementia    Physical Exam  Constitutional: She appears lethargic. She appears cachectic. No distress.  Frail, elderly  HENT:  Head: Normocephalic and atraumatic.  Cardiovascular: Normal rate and normal heart sounds.  No edema  Pulmonary/Chest: Effort normal and breath sounds normal. No respiratory distress.  Abdominal: Soft. Bowel sounds are decreased. There is no tenderness. There is no guarding.  Neurological: She appears lethargic. She is disoriented.  Skin: She is not diaphoretic.  Psychiatric: Her speech is delayed and slurred. She is withdrawn. Cognition and memory are impaired.    Vital Signs: BP (!) 98/57 (BP Location: Right Arm)   Pulse 71   Temp (!) 97.5 F (36.4 C) (Axillary)   Resp 18  Wt 34.1 kg (75 lb 2.8 oz)   SpO2 100%   BMI 14.68 kg/m  Pain Assessment: 0-10   Pain Score: 0-No pain   SpO2: SpO2: 100 % O2 Device:SpO2: 100 % O2 Flow Rate: .   IO: Intake/output summary:   Intake/Output Summary (Last 24 hours) at 02/10/2017 1053 Last data filed at 02/10/2017 0700 Gross per 24 hour  Intake 1388.33 ml  Output 600 ml  Net 788.33 ml    LBM: Last BM Date: 02/10/17 Baseline Weight: Weight: 34.1 kg (75 lb 2.8 oz) Most recent weight: Weight: 34.1 kg (75 lb 2.8 oz)     Palliative Assessment/Data: 10%    Time Total: 60 minutes Greater than 50%  of this time was spent counseling and coordinating care related to the above assessment and plan.  Juel Burrow, DNP, AGNP-C Palliative Medicine Team [phone]

## 2017-02-11 DIAGNOSIS — E039 Hypothyroidism, unspecified: Secondary | ICD-10-CM

## 2017-02-11 DIAGNOSIS — F0391 Unspecified dementia with behavioral disturbance: Secondary | ICD-10-CM

## 2017-02-11 DIAGNOSIS — G936 Cerebral edema: Secondary | ICD-10-CM

## 2017-02-11 DIAGNOSIS — E43 Unspecified severe protein-calorie malnutrition: Secondary | ICD-10-CM

## 2017-02-11 DIAGNOSIS — J690 Pneumonitis due to inhalation of food and vomit: Secondary | ICD-10-CM

## 2017-02-11 DIAGNOSIS — H409 Unspecified glaucoma: Secondary | ICD-10-CM

## 2017-02-11 LAB — BLOOD CULTURE ID PANEL (REFLEXED)
ACINETOBACTER BAUMANNII: NOT DETECTED
CANDIDA ALBICANS: NOT DETECTED
CANDIDA KRUSEI: NOT DETECTED
CANDIDA PARAPSILOSIS: NOT DETECTED
CANDIDA TROPICALIS: NOT DETECTED
CARBAPENEM RESISTANCE: NOT DETECTED
Candida glabrata: NOT DETECTED
Enterobacter cloacae complex: NOT DETECTED
Enterobacteriaceae species: DETECTED — AB
Enterococcus species: NOT DETECTED
Escherichia coli: DETECTED — AB
HAEMOPHILUS INFLUENZAE: NOT DETECTED
KLEBSIELLA OXYTOCA: NOT DETECTED
KLEBSIELLA PNEUMONIAE: NOT DETECTED
Listeria monocytogenes: NOT DETECTED
Neisseria meningitidis: NOT DETECTED
PROTEUS SPECIES: NOT DETECTED
Pseudomonas aeruginosa: NOT DETECTED
STAPHYLOCOCCUS AUREUS BCID: NOT DETECTED
STREPTOCOCCUS SPECIES: NOT DETECTED
Serratia marcescens: NOT DETECTED
Staphylococcus species: NOT DETECTED
Streptococcus agalactiae: NOT DETECTED
Streptococcus pneumoniae: NOT DETECTED
Streptococcus pyogenes: NOT DETECTED

## 2017-02-11 LAB — CBC
HEMATOCRIT: 38 % (ref 36.0–46.0)
Hemoglobin: 12.7 g/dL (ref 12.0–15.0)
MCH: 31.9 pg (ref 26.0–34.0)
MCHC: 33.4 g/dL (ref 30.0–36.0)
MCV: 95.5 fL (ref 78.0–100.0)
PLATELETS: 88 10*3/uL — AB (ref 150–400)
RBC: 3.98 MIL/uL (ref 3.87–5.11)
RDW: 15.1 % (ref 11.5–15.5)
WBC: 22.3 10*3/uL — AB (ref 4.0–10.5)

## 2017-02-11 LAB — BASIC METABOLIC PANEL
ANION GAP: 17 — AB (ref 5–15)
BUN: 49 mg/dL — AB (ref 6–20)
CO2: 16 mmol/L — AB (ref 22–32)
Calcium: 8.3 mg/dL — ABNORMAL LOW (ref 8.9–10.3)
Chloride: 109 mmol/L (ref 101–111)
Creatinine, Ser: 0.79 mg/dL (ref 0.44–1.00)
GFR calc Af Amer: >60 mL/min (ref >60)
GFR calc non Af Amer: >60 mL/min (ref >60)
GLUCOSE: 135 mg/dL — AB (ref 65–99)
POTASSIUM: 3.9 mmol/L (ref 3.5–5.1)
Sodium: 142 mmol/L (ref 135–145)

## 2017-02-11 LAB — GLUCOSE, CAPILLARY
GLUCOSE-CAPILLARY: 150 mg/dL — AB (ref 65–99)
GLUCOSE-CAPILLARY: 141 mg/dL — AB (ref 65–99)
Glucose-Capillary: 115 mg/dL — ABNORMAL HIGH (ref 65–99)

## 2017-02-11 MED ORDER — AMOXICILLIN-POT CLAVULANATE 250-62.5 MG/5ML PO SUSR: 500.0000 mg | Freq: Two times a day (BID) | ORAL | 0 refills | Status: AC

## 2017-02-11 MED ORDER — DEXAMETHASONE 0.5 MG/5ML PO SOLN: 4.0000 mg | Freq: Four times a day (QID) | ORAL | 0 refills | Status: AC

## 2017-02-11 MED ORDER — LORAZEPAM 0.5 MG PO TABS: 0.5000 mg | ORAL_TABLET | Freq: Four times a day (QID) | ORAL | 0 refills | Status: AC | PRN

## 2017-02-11 MED ORDER — MORPHINE SULFATE (CONCENTRATE) 10 MG /0.5 ML PO SOLN: 10.0000 mg | ORAL | 0 refills | Status: AC | PRN

## 2017-02-11 NOTE — Progress Notes (Signed)
Report given to [facility]. No questions or concerns at this time. Patient transported via [place].

## 2017-02-11 NOTE — NC FL2 (Signed)
[place] LEVEL OF CARE SCREENING TOOL     IDENTIFICATION  Patient Name: Joanne Anderson Birthdate: 12-25-1920 Sex: female Admission Date (Current Location): 02/09/2017  [facility] and [place] Number:  Herbalist and Address:  [facility],  [address], [place]      Provider Number: [id number]  Attending Physician Name and Address:  Barton Dubois, MD  Relative Name and Phone Number:       Current Level of Care: Hospital Recommended Level of Care: [place] Prior Approval Number:    Date Approved/Denied:   PASRR Number:   [id number]  Discharge Plan: SNF    Current Diagnoses: Patient Active Problem List   Diagnosis Date Noted  . Advance care planning   . Palliative care by specialist   . Brain mass 02/09/2017  . Hypothyroidism 02/09/2017  . Colitis presumed infectious 07/14/2016  . Essential hypertension 07/14/2016  . Hyperlipidemia 07/14/2016  . Hyperglycemia 07/14/2016  . Stasis dermatitis of both legs 07/14/2016  . Dementia with aggressive behavior 09/03/2015    Orientation RESPIRATION BLADDER Height & Weight     Self  Normal Incontinent Weight: 75 lb 2.8 oz (34.1 kg) Height:     BEHAVIORAL SYMPTOMS/MOOD NEUROLOGICAL BOWEL NUTRITION STATUS      Continent Diet(See dc summary)   Thin liquid;Other (Comment)(anticipate pt will only consume liquids, purees)   Liquid Administration via: Straw Medication Administration: Other (Comment)(liquid form may be easier for pt to tolerate) Compensations: Slow rate;Small sips/bites;Other (Comment)(clean mouth after meals) Postural Changes: Seated upright at 90 degrees;Remain upright for at least 30 minutes after po intake   AMBULATORY STATUS COMMUNICATION OF NEEDS Skin   Extensive Assist Verbally Normal                       Personal Care Assistance Level of Assistance  Bathing, Feeding, Dressing Bathing Assistance: Maximum assistance Feeding  assistance: Limited assistance Dressing Assistance: Maximum assistance     Functional Limitations Info  Sight, Hearing, Speech Sight Info: Impaired Hearing Info: Impaired Speech Info: Adequate    SPECIAL CARE FACTORS FREQUENCY                       Contractures      Additional Factors Info  Code Status, Allergies, Isolation Precautions Code Status Info: DNR Allergies Info: Codeine, Nsaids, Gabapentin     Isolation Precautions Info: MRSA     Current Medications (02/11/2017):  This is the current hospital active medication list Current Facility-Administered Medications  Medication Dose Route Frequency Provider Last Rate Last Dose  . acetaminophen (TYLENOL) tablet 650 mg  650 mg Oral Q6H PRN Rise Patience, MD       Or  . acetaminophen (TYLENOL) suppository 650 mg  650 mg Rectal Q6H PRN Rise Patience, MD   650 mg at 02/11/17 6270  . bisacodyl (DULCOLAX) suppository 10 mg  10 mg Rectal Daily PRN Rise Patience, MD      . brimonidine (ALPHAGAN) 0.2 % ophthalmic solution 1 drop  1 drop Both Eyes TID Rise Patience, MD   1 drop at 02/10/17 1722  . Chlorhexidine Gluconate Cloth 2 % PADS 6 each  6 each Topical Q0600 Robbie Lis, MD   6 each at 02/11/17 [phone]  . cholecalciferol (VITAMIN D) tablet 400 Units  400 Units Oral Daily Rise Patience, MD      . dexamethasone (DECADRON) injection 4 mg  4 mg Intravenous Q6H Robbie Lis, MD   4 mg at 02/11/17 0606  . dorzolamide-timolol (COSOPT) 22.3-6.8 MG/ML ophthalmic solution 1 drop  1 drop Both Eyes BID Rise Patience, MD   1 drop at 02/10/17 0817  . fluvoxaMINE (LUVOX) tablet 25 mg  25 mg Oral QHS Robbie Lis, MD      . latanoprost (XALATAN) 0.005 % ophthalmic solution 1 drop  1 drop Both Eyes QHS Rise Patience, MD      . Derrill Memo ON 02/13/2017] levothyroxine (SYNTHROID, LEVOTHROID) injection 6.25 mcg  6.25 mcg Intravenous Once per day on Sat Kakrakandy, Arshad N, MD      . mupirocin  ointment (BACTROBAN) 2 % 1 application  1 application Nasal BID Robbie Lis, MD   1 application at 34/91/79 [phone]  . ondansetron (ZOFRAN) tablet 4 mg  4 mg Oral Q6H PRN Rise Patience, MD       Or  . ondansetron [facility]) injection 4 mg  4 mg Intravenous Q6H PRN Rise Patience, MD      . pilocarpine (PILOCAR) 4 % ophthalmic solution 1 drop  1 drop Left Eye QID Rise Patience, MD   1 drop at 02/10/17 1720  . piperacillin-tazobactam (ZOSYN) IVPB 3.375 g  3.375 g Intravenous Q8H Minda Ditto, RPH   Stopped at 02/11/17 1005  . vancomycin (VANCOCIN) 500 mg in sodium chloride 0.9 % 100 mL IVPB  500 mg Intravenous Q48H Minda Ditto, RPH   Stopped at 02/10/17 1322     Discharge Medications: Please see discharge summary for a list of discharge medications.  Relevant Imaging Results:  Relevant Lab Results:   Additional Information SS# [id number] return to long term care SNF with hospice  Servando Snare, LCSW

## 2017-02-11 NOTE — Clinical Social Work Note (Signed)
Clinical Social Work Assessment  Patient Details  Name: Joanne Anderson MRN: [id number] Date of Birth: 1920/09/06  Date of referral:  02/11/17               Reason for consult:  Facility Placement, Discharge Planning                Permission sought to share information with:  Case Manager, Customer service manager, Family Supports Permission granted to share information::  Yes, Verbal Permission Granted  Name::     Software engineer::  [facility]  Relationship::  Ship broker Information:     Housing/Transportation Living arrangements for the past 2 months:  [place] of Information:  Other (Comment Required)(Grandson) Patient Interpreter Needed:  None Criminal Activity/Legal Involvement Pertinent to Current Situation/Hospitalization:  No - Comment as needed Significant Relationships:  Other Family Members, Community Support Lives with:  Facility Resident Do you feel safe going back to the place where you live?  Yes Need for family participation in patient care:  Yes (Comment)  Care giving concerns:  No care giving concerns at the time of assessment.   Social Worker assessment / plan:  LCSW following for SNF placement.  Patient is a long term care resident from [facility].   Patient admitted to hospital for Brain mass. Patient has medical history of of dementia hypertension hypothyroidism hyperlipidemia and stasis dermatitis of the lower extremity. Patient was brought to the hospital due to increased confusion.    LCSW spoke with grandson by phone. No family present at bedside with patient.   According to patients grandson she has good days and bad days at baseline. He reports the confusion is better and worse some days and that patient hallucinates.   At the facility granson states that patient is in a wheel chair and can move herself around in it a little.   LCSW confirmed dc plan with grandson.   LCSW confirmed baseline and patients return  with facility. Patient can return with hospice.   PLAN: Patient will return to [facility] with hospice.  Employment status:  Retired Forensic scientist:  Medicare PT Recommendations:  Not assessed at this time Information / Referral to community resources:     Patient/Family's Response to care:  Not assessed  Patient/Family's Understanding of and Emotional Response to Diagnosis, Current Treatment, and Prognosis:  Family has a clear understanding of diagnosis and participated in current treatment plan.   Emotional Assessment Appearance:  Appears stated age Attitude/Demeanor/Rapport:  Unable to Assess Affect (typically observed):  Calm Orientation:  Oriented to Self Alcohol / Substance use:  Not Applicable Psych involvement (Current and /or in the community):  No (Comment)  Discharge Needs  Concerns to be addressed:  No discharge needs identified Readmission within the last 30 days:  No Current discharge risk:  None Barriers to Discharge:  Continued Medical Work up   [employer], LCSW 02/11/2017, 12:14 PM

## 2017-02-11 NOTE — Progress Notes (Signed)
LCSW following for return to facility.  Patient will return to [facility] with hospice.  LCSW confirmed return with facility and family.  Patient will transport by [facility].   LCSW faxed DC docs via hub.   RN report # [phone]  Servando Snare, [place] CSW [phone]

## 2017-02-11 NOTE — Progress Notes (Signed)
Pt had not voided since start of shift at 1900. Bladder scan reading 677 cc. Hospitalist notified order for foley catheter obtained. Foley started using sterile technique. Yellow urine immediately returned.

## 2017-02-11 NOTE — Progress Notes (Signed)
Daily Progress Note   Patient Name: Joanne Anderson       Date: 02/11/2017 DOB: 21-Apr-1920  Age: 83 y.o. MRN#: [id number] Attending Physician: Barton Dubois, MD Primary Care Physician: Joanne Low, MD Admit Date: 02/09/2017  Reason for Consultation/Follow-up: Establishing goals of care and Hospice Evaluation  Subjective: Pt lying in bed, confused, much more alert. No family at bedside.  Length of Stay: 1  Current Medications: Scheduled Meds:  . brimonidine  1 drop Both Eyes TID  . Chlorhexidine Gluconate Cloth  6 each Topical Q0600  . cholecalciferol  400 Units Oral Daily  . dexamethasone  4 mg Intravenous Q6H  . dorzolamide-timolol  1 drop Both Eyes BID  . fluvoxaMINE  25 mg Oral QHS  . latanoprost  1 drop Both Eyes QHS  . [START ON 02/13/2017] levothyroxine  6.25 mcg Intravenous Once per day on Sat  . mupirocin ointment  1 application Nasal BID  . pilocarpine  1 drop Left Eye QID    Continuous Infusions: . piperacillin-tazobactam (ZOSYN)  IV 3.375 g (02/11/17 0605)  . vancomycin Stopped (02/10/17 1322)    PRN Meds: acetaminophen **OR** acetaminophen, bisacodyl, ondansetron **OR** ondansetron (ZOFRAN) IV  Physical Exam  Constitutional: She appears cachectic. She is cooperative.  Frail, elderly  HENT:  Head: Normocephalic and atraumatic.  Cardiovascular: Normal rate.  Pulses:      Radial pulses are 2+ on the right side, and 2+ on the left side.       Dorsalis pedis pulses are 2+ on the right side, and 2+ on the left side.  No edema  Pulmonary/Chest: Breath sounds normal. No accessory muscle usage. No tachypnea. No respiratory distress.  Abdominal: Soft. There is no tenderness.  Genitourinary:  Genitourinary Comments: Foley in place  Neurological: She is alert. She is  disoriented.  Skin: Skin is warm and dry. There is pallor.  Psychiatric: Her speech is slurred. Cognition and memory are impaired. She expresses impulsivity.            Vital Signs: BP (!) 107/46 (BP Location: Left Arm)   Pulse 92   Temp (!) 97.5 F (36.4 C) (Axillary)   Resp 19   Wt 34.1 kg (75 lb 2.8 oz)   SpO2 98%   BMI 14.68 kg/m  SpO2: SpO2: 98 % O2 Device: O2 Device: Not Delivered O2 Flow Rate:    Intake/output summary:   Intake/Output Summary (Last 24 hours) at 02/11/2017 9242 Last data filed at 02/11/2017 6834 Gross per 24 hour  Intake 1260.83 ml  Output 800 ml  Net 460.83 ml   LBM: Last BM Date: 02/10/17 Baseline Weight: Weight: 34.1 kg (75 lb 2.8 oz) Most recent weight: Weight: 34.1 kg (75 lb 2.8 oz)       Palliative Assessment/Data: 30%    Flowsheet Rows     Most Recent Value  Intake Tab  Referral Department  Hospitalist  Unit at Time of Referral  Med/Surg Unit  Palliative Care Primary Diagnosis  Neurology  Date Notified  02/10/17  Palliative Care Type  New Palliative care  Reason for referral  Clarify Goals of Care  Date of Admission  02/09/17  Date first seen by Palliative Care  02/10/17  #  of days Palliative referral response time  0 Day(s)  # of days IP prior to Palliative referral  1  Clinical Assessment  Palliative Performance Scale Score  10%  Psychosocial & Spiritual Assessment  Palliative Care Outcomes  Patient/Family meeting held?  Yes  Who was at the meeting?  grandson  Palliative Care Outcomes  Clarified goals of care, Counseled regarding hospice      Patient Active Problem List   Diagnosis Date Noted  . Advance care planning   . Palliative care by specialist   . Brain mass 02/09/2017  . Hypothyroidism 02/09/2017  . Colitis presumed infectious 07/14/2016  . Essential hypertension 07/14/2016  . Hyperlipidemia 07/14/2016  . Hyperglycemia 07/14/2016  . Stasis dermatitis of both legs 07/14/2016  . Dementia with aggressive  behavior 09/03/2015    Palliative Care Assessment & Plan   HPI:  83 y.o. female  with past medical history of dementia, HTN, HLD, peripheral neuropathy, and hypothyridism admitted on 02/09/2017 with 3 days of worsening mental status and decreased oral intake. She is admitted from SNF - [facility].  WBC 27.1, CXR shows possible LLL pna, and UA shows possible UTI. Head CT suspicious for hemorrhagic metastatic lesion within the left parietal lobe with surrounding vasogenic edema. Prior abdominal CT showed mass within left kidney which may represent renal cell carcinoma. Primary intracranial hemorrhage unrelated to malignancy felt to be less likely consideration.  PMT consulted for goals of care. Transfer to [facility] for MRI w/sedation is next step in care if aggressive measures are desired by family. Transfer on hold to determine goals of care.   Assessment: Patient much more alert today and oral intake increasing. Continue w/plan to treat the treatable - continue antibiotics and steroids. No further work up for possible brain mets. Plan to transfer back to [facility] and invoke hospice benefit.   Recommendations/Plan:  Treat the treatable - UTI and PNA, continue steroids  Plan for transfer back to [facility] w/hospice  Goals of Care and Additional Recommendations:  Limitations on Scope of Treatment: No further work up for brain lesions, DNR, treat the treatable  Code Status:  DNR  Prognosis:   < 6 months  Discharge Planning:  [place] with Hospice  Care plan was discussed with attempted to call Antony Haste - will update when he calls back, Dr. Dyann Kief, bedside RN  Thank you for allowing the Palliative Medicine Team to assist in the care of this patient.   Total Time 25 minutes Prolonged Time Billed  no       Greater than 50%  of this time was spent counseling and coordinating care related to the above assessment and plan.  Juel Burrow, DNP, AGNP-C Palliative  Medicine Team Team Phone # [phone]

## 2017-02-11 NOTE — Evaluation (Signed)
Clinical/Bedside Swallow Evaluation Patient Details  Name: CASHAE WEICH MRN: [id number] Date of Birth: 1920/03/07  Today's Date: 02/11/2017 Time: SLP Start Time (ACUTE ONLY): 1136 SLP Stop Time (ACUTE ONLY): 1206 SLP Time Calculation (min) (ACUTE ONLY): 30 min  Past Medical History:  Past Medical History:  Diagnosis Date  . Dementia   . Glaucoma   . Hyperlipidemia   . Hypertension   . Macular degeneration   . Peripheral neuropathy   . Stasis dermatitis of both legs    goes to the [facility]   Past Surgical History:  Past Surgical History:  Procedure Laterality Date  . CHOLECYSTECTOMY    . EYE SURGERY    . HEMICOLECTOMY     remote   HPI:  83 yo female adm to [facility] with AMS from SNF.  PMH + for renal mass and now with probable metastatic lesion - family has opted not to pursue aggressive treatment for brain lesion.  Pt with possible pna and UTI.  Swallow evaluation ordered.     Assessment / Plan / Recommendation Clinical Impression  Pt's largest aspiration risk is relying on others for feeding and positioning limitation.   Pt also with discomfort when SLP placed her partial and she states she does not use it when she eats.  SLP removed partial and pt willing to accept minimal amount of po intake.  NO indications of airway compromise with po pt did accept.  Thin milk administered via straw with timely swallow apparent.  Pt did not masticate peaches and expectorated it stating "I don't like it".  She consumed 1/2 tsp of mashed potatoes x2.  Recommend continue diet with precautions = providing pt with intake only when accepting and tolerating.  Chronic aspiration risk ongoing due to pt's reliance on others for feeding.  Skilled intervention included determing best tolerated consistencies and effective strategies.  No SLP follow up indicated.  SLP Visit Diagnosis: Dysphagia, oropharyngeal phase (R13.12)    Aspiration Risk  Moderate aspiration risk    Diet Recommendation Thin  liquid;Other (Comment)(anticipate pt will only consume liquids, purees)   Liquid Administration via: Straw Medication Administration: Other (Comment)(liquid form may be easier for pt to tolerate) Compensations: Slow rate;Small sips/bites;Other (Comment)(clean mouth after meals) Postural Changes: Seated upright at 90 degrees;Remain upright for at least 30 minutes after po intake    Other  Recommendations Oral Care Recommendations: Oral care QID   Follow up Recommendations None      Frequency and Duration            Prognosis        Swallow Study   General Date of Onset: 02/11/17 HPI: 83 yo female adm to [facility] with AMS from SNF.  PMH + for renal mass and now with probable metastatic lesion - family has opted not to pursue aggressive treatment for brain lesion.  Pt with possible pna and UTI.  Swallow evaluation ordered.   Type of Study: Bedside Swallow Evaluation Diet Prior to this Study: Regular;Thin liquids Temperature Spikes Noted: No Respiratory Status: Room air History of Recent Intubation: No Behavior/Cognition: Alert;Other (Comment)(inconsistently follows directions) Oral Cavity Assessment: Dry;Dried secretions Oral Cavity - Dentition: Missing dentition;Other (Comment)(lower partial, slp cleaned and placed and pt stated it caused pain) Patient Positioning: Partially reclined;Other (comment)(elevated side lying, brought head up for maximal airway protection) Baseline Vocal Quality: Normal Volitional Cough: Weak Volitional Swallow: Unable to elicit    Oral/Motor/Sensory Function Overall Oral Motor/Sensory Function: Generalized oral weakness(generalized weakness)   Ice Chips Ice  chips: Not tested   Thin Liquid Thin Liquid: Within functional limits Presentation: Straw    Nectar Thick Nectar Thick Liquid: Not tested Other Comments: pt declined to consume Ensure stating she doesn't like it   Honey Thick Honey Thick Liquid: Not tested   Puree Puree: Impaired Presentation:  Spoon Oral Phase Impairments: Reduced lingual movement/coordination Oral Phase Functional Implications: Prolonged oral transit Other Comments: pt accepting 1/2 tsp boluses only, minimal po consumed prior to her declining further   Solid   GO   Solid: Impaired Presentation: Spoon Oral Phase Impairments: Impaired mastication;Reduced labial seal;Poor awareness of bolus;Reduced lingual movement/coordination Oral Phase Functional Implications: Impaired mastication;Right anterior spillage;Left anterior spillage;Oral residue;Other (comment)(pt did not adequately masticate and expectorated peach stating "I don't like it") Other Comments: pt         Macario Golds 02/11/2017,12:31 PM   Luanna Salk, [facility] SLP [phone]

## 2017-02-11 NOTE — Progress Notes (Signed)
Date: February 11, 2017 Chart review for discharge needs:  None found for case management. Patient has no questions concerning post hospital care.

## 2017-02-11 NOTE — Progress Notes (Signed)
PHARMACY - PHYSICIAN COMMUNICATION CRITICAL VALUE ALERT - BLOOD CULTURE IDENTIFICATION (BCID)  Joanne Anderson is an 83 y.o. female who presented to [facility] on 02/09/2017 with a chief complaint of altered mental status  Assessment:  PNA (include suspected source if known)  Name of physician (or Provider) Contacted: Raliegh Ip Schorr NP  Current antibiotics: Vanc/Zosyn  Changes to prescribed antibiotics recommended:  + E coli in 2 bottles, consider narrowing to Rocephin 2 Gm IV q24h when clinically appropriate  Results for orders placed or performed during the hospital encounter of 02/09/17  Blood Culture ID Panel (Reflexed) (Collected: 02/10/2017  6:45 AM)  Result Value Ref Range   Enterococcus species NOT DETECTED NOT DETECTED   Listeria monocytogenes NOT DETECTED NOT DETECTED   Staphylococcus species NOT DETECTED NOT DETECTED   Staphylococcus aureus NOT DETECTED NOT DETECTED   Streptococcus species NOT DETECTED NOT DETECTED   Streptococcus agalactiae NOT DETECTED NOT DETECTED   Streptococcus pneumoniae NOT DETECTED NOT DETECTED   Streptococcus pyogenes NOT DETECTED NOT DETECTED   Acinetobacter baumannii NOT DETECTED NOT DETECTED   Enterobacteriaceae species DETECTED (A) NOT DETECTED   Enterobacter cloacae complex NOT DETECTED NOT DETECTED   Escherichia coli DETECTED (A) NOT DETECTED   Klebsiella oxytoca NOT DETECTED NOT DETECTED   Klebsiella pneumoniae NOT DETECTED NOT DETECTED   Proteus species NOT DETECTED NOT DETECTED   Serratia marcescens NOT DETECTED NOT DETECTED   Carbapenem resistance NOT DETECTED NOT DETECTED   Haemophilus influenzae NOT DETECTED NOT DETECTED   Neisseria meningitidis NOT DETECTED NOT DETECTED   Pseudomonas aeruginosa NOT DETECTED NOT DETECTED   Candida albicans NOT DETECTED NOT DETECTED   Candida glabrata NOT DETECTED NOT DETECTED   Candida krusei NOT DETECTED NOT DETECTED   Candida parapsilosis NOT DETECTED NOT DETECTED   Candida tropicalis NOT DETECTED  NOT DETECTED    Dorrene German 02/11/2017  12:26 AM

## 2017-02-11 NOTE — Discharge Summary (Addendum)
Physician Discharge Summary  Joanne Anderson QQV:[id number] DOB: [birth year] DOA: 02/09/2017  PCP: Wenda Low, MD  Admit date: 02/09/2017 Discharge date: 02/11/2017  Time spent: 35 minutes  Recommendations for Outpatient Follow-up:  1. Full comfort care 2. Complete 5 more days of antibiotics as per discussion with family members treating what is treatable. 3. No further hospitalizations.   Discharge Diagnoses:  Principal Problem:   Brain mass Active Problems:   Dementia with behavioral disturbance   Essential hypertension   Hypothyroidism   Advance care planning   Palliative care by specialist   Cerebral edema ([place])   Aspiration pneumonia of left lower lobe (HCC)   Glaucoma of both eyes Severe protein calorie malnutrition  Urinary retention UTI  Discharge Condition: Stable and comfortable at this moment.  Patient will be discharged back to skilled nursing facility with hospice follow-up.  Plan of care is for full comfort.  Diet recommendation: Comfort feeding  Filed Weights   02/10/17 0626  Weight: 34.1 kg (75 lb 2.8 oz)    History of present illness:  As per  By Dr. Hal Hope 12/10/2016 83 y.o. female with history of dementia hypertension hypothyroidism hyperlipidemia and stasis dermatitis of the lower extremity was brought to the ER the patient's family found the patient has been increasingly confused for last 3 days.  Patient also had hallucinations.  Did not record any fever  Hospital Course:  1-brain mass/hemorrhagic metastatic lesion with left parietal lobe surrounding vasogenic edema -Following palliative care goals of care discussion decision has been made to keep patient comfortable and not pursued any further intervention. -At this moment we will focus on symptom management and the patient will be discharged back to her nursing facility with hospice follow-up. -Adjustment of her medications will be made to reflect full comfort care transition.  2-left base  pneumonia secondary to aspiration: -Patient found to be at risk for aspiration during swallowing evaluation. -But at this moment base on goals of care decision will allow comfort feeding -will finish treatment with oral antibiotics -patient is afebrile and in no resp distress.  3-hypothyroidism  -continue synthroid  4-dementia/hallucinations and worsening mentation: Most likely associated with acute toxic encephalopathy. -with mild behavioral disturbances -continue supportive care  -Finished treatment with antibiotics and focus on comfort care.  5-E. Coli UTI/urinary retention -patient received IV vanc and zosyn for 3 days -then transition to oral antibiotics; initially sensitivity/cultures pending at discharge; but then antibiotics adjusted base on results.  -Ceftin ordered -no fever -no dysuria. -discharge with foley for skin integrity and comfort.  6-severe protein calorie malnutrition  -Body mass index is 14.68 kg/m. -now following full comfort care only. -comfort feeding   Procedures:  See below for x-ray reports  Consultations:  Palliative care  Discharge Exam: Vitals:   02/11/17 0012 02/11/17 0656  BP: (!) 101/50 (!) 107/46  Pulse: 88 92  Resp: 19 19  Temp: (!) 97.5 F (36.4 C) (!) 97.5 F (36.4 C)  SpO2: 92% 98%    General: Frail, underweight, chronically ill in appearance and mildly agitated.  Denies chest pain, no shortness of breath, no nausea vomiting.  Patient refusing to eat with nurse tech and refusing also some og her meds.  No fever. Cardiovascular: S1 and S2, positive systolic ejection murmur, no rubs, no gallops.  No JVD appreciated on exam. Respiratory: No wheezing, no crackles, normal respiratory effort. Abdomen: Soft, nontender, nondistended, positive bowel sounds. Extremities: Stasis dermatitis appreciated, left leg with dressing and prevalon boot in place.  No  cyanosis, no clubbing, no swelling. Neurologic: Oriented to person, able to  follow very simple commands intermittently, cranial nerves appear intact; moving 4 limbs.  Discharge Instructions   Discharge Instructions    Discharge instructions   Complete by:  As directed    Comfort feeding Dysphagia 3 diet and thin liquids  Hospice to follow up patient at SNF and transition to full comfort  No further hospitalization Complete 5 days of antibiotics if able to tolerate by mouth.     Allergies as of 02/11/2017      Reactions   Codeine Anaphylaxis   Nsaids Nausea And Vomiting   Gabapentin Nausea And Vomiting   Other Nausea And Vomiting, Other (See Comments)   Pt states that she is allergic to all pain medications.       Medication List    STOP taking these medications   cefpodoxime 200 MG tablet Commonly known as:  VANTIN   lovastatin 10 MG tablet Commonly known as:  MEVACOR   metroNIDAZOLE 500 MG tablet Commonly known as:  FLAGYL   nebivolol 10 MG tablet Commonly known as:  BYSTOLIC   ondansetron 4 MG tablet Commonly known as:  ZOFRAN   predniSONE 5 MG tablet Commonly known as:  DELTASONE   Vitamin D3 400 units tablet     TAKE these medications   acetaminophen 500 MG tablet Commonly known as:  TYLENOL Take 500 mg by mouth every 6 (six) hours as needed for mild pain, moderate pain, fever or headache.   amoxicillin-clavulanate 250-62.5 MG/5ML suspension Commonly known as:  AUGMENTIN Take 10 mLs (500 mg total) by mouth 2 (two) times daily for 5 days.   bisacodyl 10 MG suppository Commonly known as:  DULCOLAX Place 10 mg rectally daily as needed for mild constipation or moderate constipation.   brimonidine 0.2 % ophthalmic solution Commonly known as:  ALPHAGAN Place 1 drop into both eyes 3 (three) times daily.   dexamethasone 0.5 MG/5ML solution Commonly known as:  DECADRON Take 40 mLs (4 mg total) by mouth every 6 (six) hours.   dorzolamide-timolol 22.3-6.8 MG/ML ophthalmic solution Commonly known as:  COSOPT Place 1 drop into  both eyes 2 (two) times daily.   fluvoxaMINE 25 MG tablet Commonly known as:  LUVOX Take 25 mg by mouth at bedtime.   latanoprost 0.005 % ophthalmic solution Commonly known as:  XALATAN Place 1 drop into both eyes at bedtime.   levothyroxine 25 MCG tablet Commonly known as:  SYNTHROID, LEVOTHROID Take 12.5 mcg by mouth every Saturday.   LORazepam 0.5 MG tablet Commonly known as:  ATIVAN Take 1 tablet (0.5 mg total) by mouth every 6 (six) hours as needed for up to 15 days for anxiety.   Melatonin 10 MG Tabs Take 5 mg by mouth at bedtime.   mineral oil enema Place 1 enema rectally daily as needed for severe constipation.   morphine CONCENTRATE 10 mg / 0.5 ml concentrated solution Take 0.5 mLs (10 mg total) by mouth every 2 (two) hours as needed for severe pain.   pilocarpine 4 % ophthalmic solution Commonly known as:  PILOCAR Place 1 drop into the left eye 4 (four) times daily.      Allergies  Allergen Reactions  . Codeine Anaphylaxis  . Nsaids Nausea And Vomiting  . Gabapentin Nausea And Vomiting  . Other Nausea And Vomiting and Other (See Comments)    Pt states that she is allergic to all pain medications.     The results of significant  diagnostics from this hospitalization (including imaging, microbiology, ancillary and laboratory) are listed below for reference.    Significant Diagnostic Studies: Dg Chest 2 View  Result Date: 02/09/2017 CLINICAL DATA:  Altered mental status. EXAM: CHEST  2 VIEW COMPARISON:  09/02/2015 FINDINGS: Study limited due to severe kyphosis. No confluent airspace consolidation. Cannot exclude patchy airspace opacity in the posterior left base. No large effusion. Pulmonary vasculature is probably normal. There is a hiatal hernia. IMPRESSION: Limited study. No large effusion or lobar consolidation. Cannot exclude a patchy area of consolidation or atelectasis in the posterior left base. Moderate hiatal hernia. Electronically Signed   By: Andreas Newport M.D.   On: 02/09/2017 19:09   Ct Head Wo Contrast  Result Date: 02/09/2017 CLINICAL DATA:  83 year old female with failure to thrive. Left renal mass. Initial encounter. EXAM: CT HEAD WITHOUT CONTRAST TECHNIQUE: Contiguous axial images were obtained from the base of the skull through the vertex without intravenous contrast. COMPARISON:  None. FINDINGS: Brain: 1.4 cm hyperdense lesion posterior media left parietal lobe with surrounding vasogenic edema. Although intracranial hemorrhage cannot be excluded this lesion is suspicious for hemorrhagic metastatic disease given the surrounding vasogenic edema and additional finding of vasogenic edema within the right frontal and parietal lobe. Atrophy. Vascular: Vascular calcifications Skull: No acute abnormality Sinuses/Orbits: No acute orbital abnormality. Visualized paranasal sinuses are clear Other: Mastoid air cells and middle ear cavities are clear. IMPRESSION: Findings suspicious for hemorrhagic metastatic lesion within the left parietal lobe with surrounding vasogenic edema. Vasogenic edema also noted within the right frontal and parietal lobe. Contrast-enhanced MR or CT would prove helpful for further delineation. Upon reviewing patient's prior abdominal CTs, there is a mass within the left kidney which may represent renal cell carcinoma. Renal cell carcinoma intracranial metastatic lesions can be hemorrhagic. Primary intracranial hemorrhage unrelated to malignancy felt to be less likely consideration. These results were called by telephone at the time of interpretation on 02/09/2017 at 7:22 pm to Dr. Gilford Raid, who verbally acknowledged these results. Electronically Signed   By: Genia Del M.D.   On: 02/09/2017 19:29    Microbiology: Recent Results (from the past 240 hour(s))  Urine culture     Status: Abnormal (Preliminary result)   Collection Time: 02/09/17  7:46 PM  Result Value Ref Range Status   Specimen Description URINE, RANDOM  Final    Special Requests NONE  Final   Culture (A)  Final    >=100,000 COLONIES/mL ESCHERICHIA COLI SUSCEPTIBILITIES TO FOLLOW Performed at [facility], [address], [place], [place] [address]    Report Status PENDING  Incomplete  MRSA PCR Screening     Status: Abnormal   Collection Time: 02/10/17  4:14 AM  Result Value Ref Range Status   MRSA by PCR POSITIVE (A) NEGATIVE Final    Comment:        The GeneXpert MRSA Assay (FDA approved for NASAL specimens only), is one component of a comprehensive MRSA colonization surveillance program. It is not intended to diagnose MRSA infection nor to guide or monitor treatment for MRSA infections. RESULT CALLED TO, READ BACK BY AND VERIFIED WITH: D.BLOCK AT 6962 ON 02/10/17 BY N.THOMPSON   Culture, blood (routine x 2)     Status: Abnormal (Preliminary result)   Collection Time: 02/10/17  6:45 AM  Result Value Ref Range Status   Specimen Description BLOOD RIGHT ANTECUBITAL  Final   Special Requests AEROBIC BOTTLE ONLY Blood Culture adequate volume  Final   Culture  Setup Time   Final    GRAM NEGATIVE RODS AEROBIC BOTTLE ONLY CRITICAL RESULT CALLED TO, READ BACK BY AND VERIFIED WITH: B. GREEN, RPHARMD ([place]) AT 0021 ON 02/11/17 BY C. JESSUP, MLT.    Culture (A)  Final    ESCHERICHIA COLI SUSCEPTIBILITIES TO FOLLOW Performed at [facility], [place] [address], [place], [place] [address]    Report Status PENDING  Incomplete  Blood Culture ID Panel (Reflexed)     Status: Abnormal   Collection Time: 02/10/17  6:45 AM  Result Value Ref Range Status   Enterococcus species NOT DETECTED NOT DETECTED Final   Listeria monocytogenes NOT DETECTED NOT DETECTED Final   Staphylococcus species NOT DETECTED NOT DETECTED Final   Staphylococcus aureus NOT DETECTED NOT DETECTED Final   Streptococcus species NOT DETECTED NOT DETECTED Final   Streptococcus agalactiae NOT DETECTED NOT DETECTED Final   Streptococcus pneumoniae NOT DETECTED NOT  DETECTED Final   Streptococcus pyogenes NOT DETECTED NOT DETECTED Final   Acinetobacter baumannii NOT DETECTED NOT DETECTED Final   Enterobacteriaceae species DETECTED (A) NOT DETECTED Final    Comment: Enterobacteriaceae represent a large family of gram-negative bacteria, not a single organism. CRITICAL RESULT CALLED TO, READ BACK BY AND VERIFIED WITH: B. GREEN, RPHARMD ([place]) AT 0021 ON 02/11/17 BY C. JESSUP, MLT.    Enterobacter cloacae complex NOT DETECTED NOT DETECTED Final   Escherichia coli DETECTED (A) NOT DETECTED Final    Comment: CRITICAL RESULT CALLED TO, READ BACK BY AND VERIFIED WITH: B. GREEN, RPHARMD ([place]) AT 0021 ON 12/13/181 BY C. JESSUP, MLT.    Klebsiella oxytoca NOT DETECTED NOT DETECTED Final   Klebsiella pneumoniae NOT DETECTED NOT DETECTED Final   Proteus species NOT DETECTED NOT DETECTED Final   Serratia marcescens NOT DETECTED NOT DETECTED Final   Carbapenem resistance NOT DETECTED NOT DETECTED Final   Haemophilus influenzae NOT DETECTED NOT DETECTED Final   Neisseria meningitidis NOT DETECTED NOT DETECTED Final   Pseudomonas aeruginosa NOT DETECTED NOT DETECTED Final   Candida albicans NOT DETECTED NOT DETECTED Final   Candida glabrata NOT DETECTED NOT DETECTED Final   Candida krusei NOT DETECTED NOT DETECTED Final   Candida parapsilosis NOT DETECTED NOT DETECTED Final   Candida tropicalis NOT DETECTED NOT DETECTED Final    Comment: Performed at [facility], [place] [address], , [place] [address]  Culture, blood (routine x 2)     Status: None (Preliminary result)   Collection Time: 02/10/17  6:52 AM  Result Value Ref Range Status   Specimen Description BLOOD RIGHT FOREARM  Final   Special Requests AEROBIC BOTTLE ONLY Blood Culture adequate volume  Final   Culture  Setup Time   Final    GRAM NEGATIVE RODS AEROBIC BOTTLE ONLY CRITICAL RESULT CALLED TO, READ BACK BY AND VERIFIED WITH: B. GREEN, RPHARMD ([place]) AT 0021 ON 02/11/17 BY C. JESSUP,  MLT. Performed at [facility], [place] [address] [place],  [address]    Culture GRAM NEGATIVE RODS  Final   Report Status PENDING  Incomplete     Labs: Basic Metabolic Panel: Recent Labs  Lab 02/09/17 1848 02/10/17 0510 02/11/17 0613  NA 136 139 142  K 3.5 4.0 3.9  CL 100* 106 109  CO2 22 19* 16*  GLUCOSE 67 70 135*  BUN 52* 48* 49*  CREATININE 1.15* 0.89 0.79  CALCIUM 8.2* 7.9* 8.3*  MG 1.3*  --   --    Liver Function Tests:  Recent Labs  Lab 02/09/17 1848  AST 61*  ALT 43  ALKPHOS 117  BILITOT 1.5*  PROT 6.0*  ALBUMIN 2.6*   Recent Labs  Lab 02/09/17 1848  LIPASE 20   CBC: Recent Labs  Lab 02/09/17 1848 02/10/17 0510 02/11/17 0613  WBC 47.6* 27.1* 22.3*  NEUTROABS 40.9*  --   --   HGB 12.4 13.3 12.7  HCT 36.4 38.4 38.0  MCV 95.8 96.2 95.5  PLT 124* 105* 88*   Cardiac Enzymes: Recent Labs  Lab 02/09/17 1848  CKTOTAL 405*    CBG: Recent Labs  Lab 02/10/17 1133 02/10/17 1732 02/11/17 0008 02/11/17 0653 02/11/17 1131  GLUCAP 168* 120* 115* 150* 141*    Signed:  Barton Dubois MD.  [facility] 02/11/2017, 1:23 PM

## 2017-02-12 DIAGNOSIS — G939 Disorder of brain, unspecified: Secondary | ICD-10-CM | POA: Diagnosis not present

## 2017-02-12 DIAGNOSIS — D49512 Neoplasm of unspecified behavior of left kidney: Secondary | ICD-10-CM | POA: Diagnosis not present

## 2017-02-12 DIAGNOSIS — I10 Essential (primary) hypertension: Secondary | ICD-10-CM | POA: Diagnosis not present

## 2017-02-12 DIAGNOSIS — E46 Unspecified protein-calorie malnutrition: Secondary | ICD-10-CM | POA: Diagnosis not present

## 2017-02-12 DIAGNOSIS — J690 Pneumonitis due to inhalation of food and vomit: Secondary | ICD-10-CM | POA: Diagnosis not present

## 2017-02-12 DIAGNOSIS — C7931 Secondary malignant neoplasm of brain: Secondary | ICD-10-CM | POA: Diagnosis not present

## 2017-02-12 DIAGNOSIS — R627 Adult failure to thrive: Secondary | ICD-10-CM | POA: Diagnosis not present

## 2017-02-12 DIAGNOSIS — D649 Anemia, unspecified: Secondary | ICD-10-CM | POA: Diagnosis not present

## 2017-02-12 DIAGNOSIS — N390 Urinary tract infection, site not specified: Secondary | ICD-10-CM | POA: Diagnosis not present

## 2017-02-12 DIAGNOSIS — E039 Hypothyroidism, unspecified: Secondary | ICD-10-CM | POA: Diagnosis not present

## 2017-02-12 DIAGNOSIS — R1310 Dysphagia, unspecified: Secondary | ICD-10-CM | POA: Diagnosis not present

## 2017-02-12 DIAGNOSIS — F0391 Unspecified dementia with behavioral disturbance: Secondary | ICD-10-CM | POA: Diagnosis not present

## 2017-02-12 DIAGNOSIS — C801 Malignant (primary) neoplasm, unspecified: Secondary | ICD-10-CM | POA: Diagnosis not present

## 2017-02-12 DIAGNOSIS — E785 Hyperlipidemia, unspecified: Secondary | ICD-10-CM | POA: Diagnosis not present

## 2017-02-12 DIAGNOSIS — J188 Other pneumonia, unspecified organism: Secondary | ICD-10-CM | POA: Diagnosis not present

## 2017-02-12 DIAGNOSIS — E559 Vitamin D deficiency, unspecified: Secondary | ICD-10-CM | POA: Diagnosis not present

## 2017-02-12 DIAGNOSIS — Z79899 Other long term (current) drug therapy: Secondary | ICD-10-CM | POA: Diagnosis not present

## 2017-02-12 LAB — URINE CULTURE

## 2017-02-13 DIAGNOSIS — M6281 Muscle weakness (generalized): Secondary | ICD-10-CM | POA: Diagnosis not present

## 2017-02-13 DIAGNOSIS — R1311 Dysphagia, oral phase: Secondary | ICD-10-CM | POA: Diagnosis not present

## 2017-02-13 DIAGNOSIS — I872 Venous insufficiency (chronic) (peripheral): Secondary | ICD-10-CM | POA: Diagnosis not present

## 2017-02-13 DIAGNOSIS — J690 Pneumonitis due to inhalation of food and vomit: Secondary | ICD-10-CM | POA: Diagnosis not present

## 2017-02-13 DIAGNOSIS — I10 Essential (primary) hypertension: Secondary | ICD-10-CM | POA: Diagnosis not present

## 2017-02-13 DIAGNOSIS — F0391 Unspecified dementia with behavioral disturbance: Secondary | ICD-10-CM | POA: Diagnosis not present

## 2017-02-13 DIAGNOSIS — R278 Other lack of coordination: Secondary | ICD-10-CM | POA: Diagnosis not present

## 2017-02-13 DIAGNOSIS — R1312 Dysphagia, oropharyngeal phase: Secondary | ICD-10-CM | POA: Diagnosis not present

## 2017-02-13 DIAGNOSIS — C649 Malignant neoplasm of unspecified kidney, except renal pelvis: Secondary | ICD-10-CM | POA: Diagnosis not present

## 2017-02-13 DIAGNOSIS — E039 Hypothyroidism, unspecified: Secondary | ICD-10-CM | POA: Diagnosis not present

## 2017-02-13 DIAGNOSIS — R41841 Cognitive communication deficit: Secondary | ICD-10-CM | POA: Diagnosis not present

## 2017-02-13 DIAGNOSIS — R2689 Other abnormalities of gait and mobility: Secondary | ICD-10-CM | POA: Diagnosis not present

## 2017-02-13 DIAGNOSIS — E785 Hyperlipidemia, unspecified: Secondary | ICD-10-CM | POA: Diagnosis not present

## 2017-02-13 DIAGNOSIS — K529 Noninfective gastroenteritis and colitis, unspecified: Secondary | ICD-10-CM | POA: Diagnosis not present

## 2017-02-13 DIAGNOSIS — S22009D Unspecified fracture of unspecified thoracic vertebra, subsequent encounter for fracture with routine healing: Secondary | ICD-10-CM | POA: Diagnosis not present

## 2017-02-13 LAB — CULTURE, BLOOD (ROUTINE X 2)
SPECIAL REQUESTS: ADEQUATE
Special Requests: ADEQUATE

## 2017-02-14 DIAGNOSIS — R41841 Cognitive communication deficit: Secondary | ICD-10-CM | POA: Diagnosis not present

## 2017-02-14 DIAGNOSIS — K529 Noninfective gastroenteritis and colitis, unspecified: Secondary | ICD-10-CM | POA: Diagnosis not present

## 2017-02-14 DIAGNOSIS — R1311 Dysphagia, oral phase: Secondary | ICD-10-CM | POA: Diagnosis not present

## 2017-02-14 DIAGNOSIS — I872 Venous insufficiency (chronic) (peripheral): Secondary | ICD-10-CM | POA: Diagnosis not present

## 2017-02-14 DIAGNOSIS — R1312 Dysphagia, oropharyngeal phase: Secondary | ICD-10-CM | POA: Diagnosis not present

## 2017-02-14 DIAGNOSIS — J690 Pneumonitis due to inhalation of food and vomit: Secondary | ICD-10-CM | POA: Diagnosis not present

## 2017-02-15 DIAGNOSIS — I872 Venous insufficiency (chronic) (peripheral): Secondary | ICD-10-CM | POA: Diagnosis not present

## 2017-02-15 DIAGNOSIS — R1312 Dysphagia, oropharyngeal phase: Secondary | ICD-10-CM | POA: Diagnosis not present

## 2017-02-15 DIAGNOSIS — R1311 Dysphagia, oral phase: Secondary | ICD-10-CM | POA: Diagnosis not present

## 2017-02-15 DIAGNOSIS — K529 Noninfective gastroenteritis and colitis, unspecified: Secondary | ICD-10-CM | POA: Diagnosis not present

## 2017-02-15 DIAGNOSIS — R41841 Cognitive communication deficit: Secondary | ICD-10-CM | POA: Diagnosis not present

## 2017-02-15 DIAGNOSIS — J690 Pneumonitis due to inhalation of food and vomit: Secondary | ICD-10-CM | POA: Diagnosis not present

## 2017-02-16 DIAGNOSIS — R1311 Dysphagia, oral phase: Secondary | ICD-10-CM | POA: Diagnosis not present

## 2017-02-16 DIAGNOSIS — K529 Noninfective gastroenteritis and colitis, unspecified: Secondary | ICD-10-CM | POA: Diagnosis not present

## 2017-02-16 DIAGNOSIS — R41841 Cognitive communication deficit: Secondary | ICD-10-CM | POA: Diagnosis not present

## 2017-02-16 DIAGNOSIS — I872 Venous insufficiency (chronic) (peripheral): Secondary | ICD-10-CM | POA: Diagnosis not present

## 2017-02-16 DIAGNOSIS — J690 Pneumonitis due to inhalation of food and vomit: Secondary | ICD-10-CM | POA: Diagnosis not present

## 2017-02-16 DIAGNOSIS — R1312 Dysphagia, oropharyngeal phase: Secondary | ICD-10-CM | POA: Diagnosis not present

## 2017-03-02 DEATH — deceased

## 2018-08-21 IMAGING — CT CT ABD-PELV W/ CM
2 of 5 series · 15 of 46 positions shown, 17 images · IV contrast (ISOVUE)
Comparison: 01/22/2012

CLINICAL DATA: Left lower quadrant pain

EXAM:
CT ABDOMEN AND PELVIS WITH CONTRAST
TECHNIQUE: Multidetector CT imaging of the abdomen and pelvis was performed
using the standard protocol following bolus administration of
intravenous contrast.
CONTRAST:  75mL H0MW1V-QLL IOPAMIDOL (H0MW1V-QLL) INJECTION 61%

[Series 2: abd/pel with · axial · 0.61mm/px · z∈[-333,-3]mm · 12 of 78 slices shown, 14 images]
[im 6/78  soft-tissue]
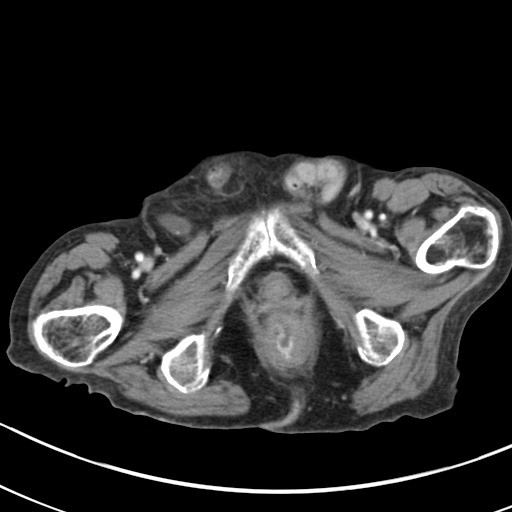
[im 6/78  bone]
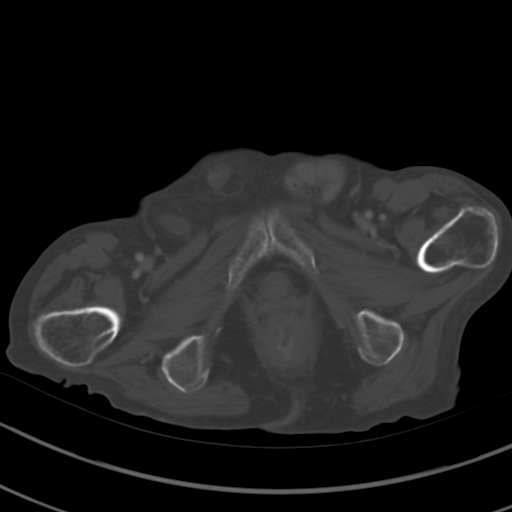
[im 12/78  soft-tissue]
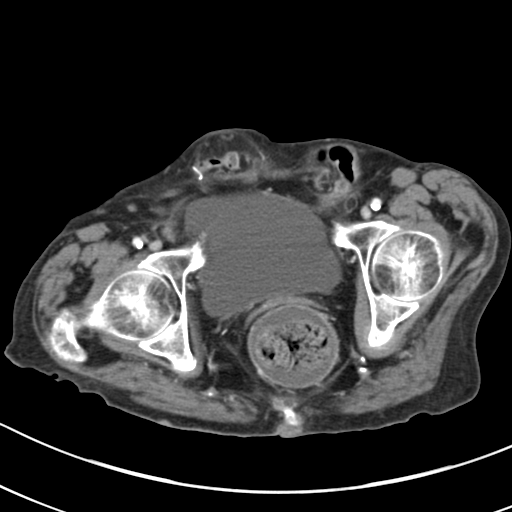
[im 17/78  soft-tissue]
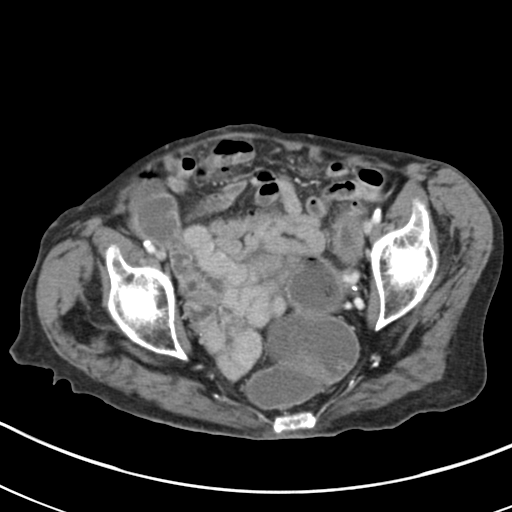
[im 23/78  soft-tissue]
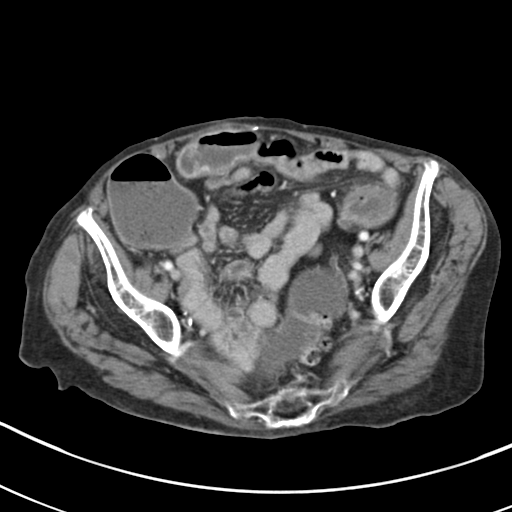
[im 28/78  soft-tissue]
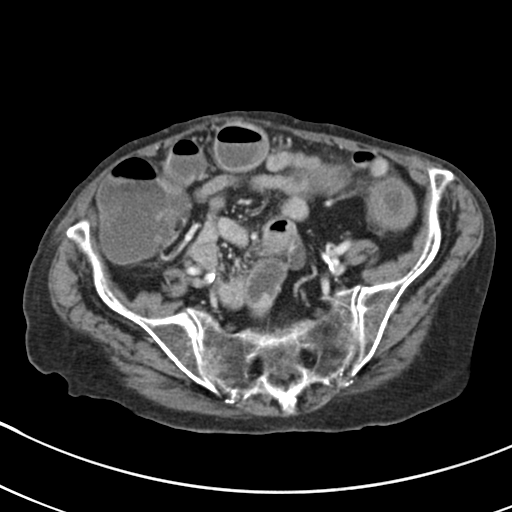
[im 34/78  soft-tissue]
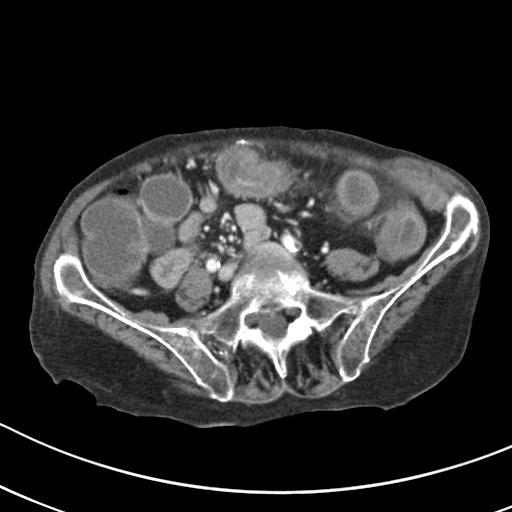
[im 45/78  soft-tissue]
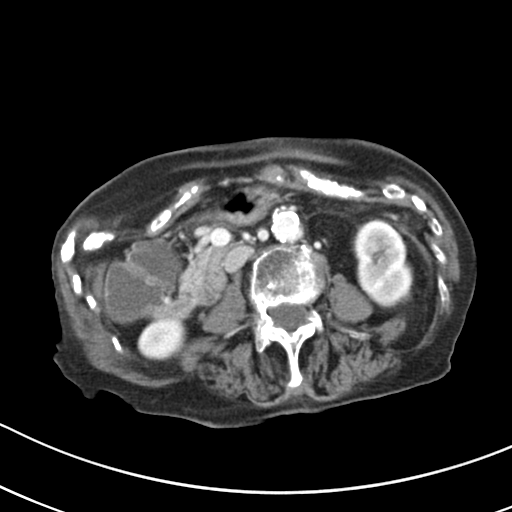
[im 50/78  soft-tissue]
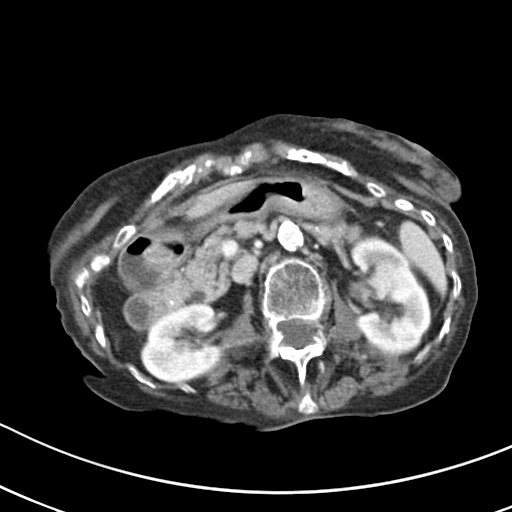
[im 56/78  soft-tissue]
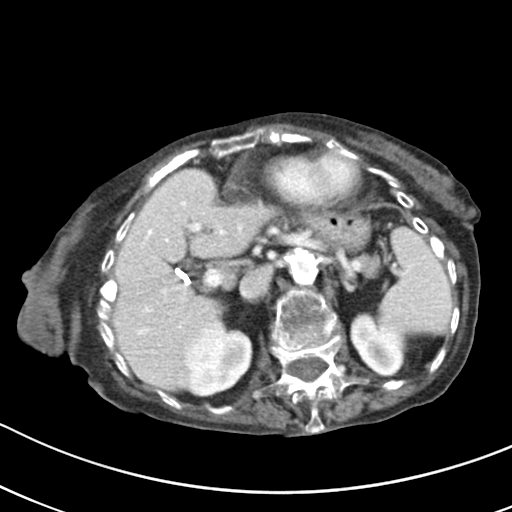
[im 56/78  bone]
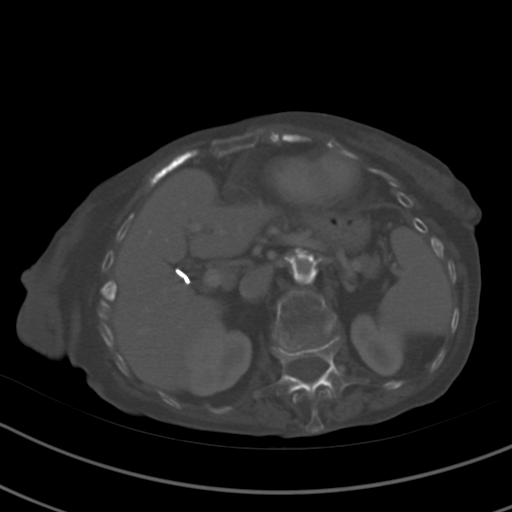
[im 61/78  soft-tissue]
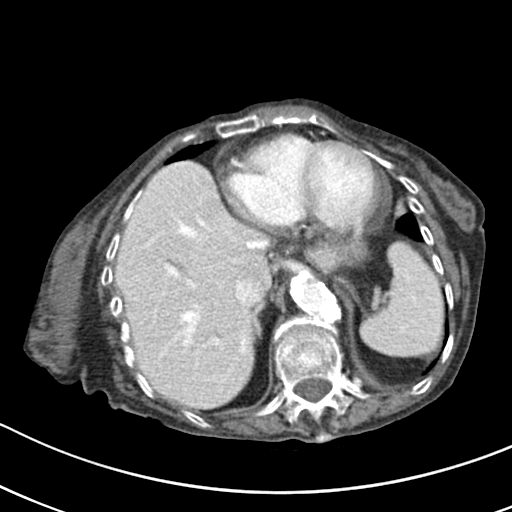
[im 67/78  soft-tissue]
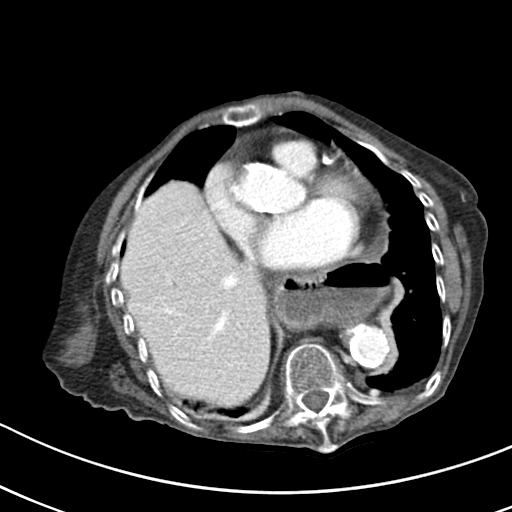
[im 72/78  soft-tissue]
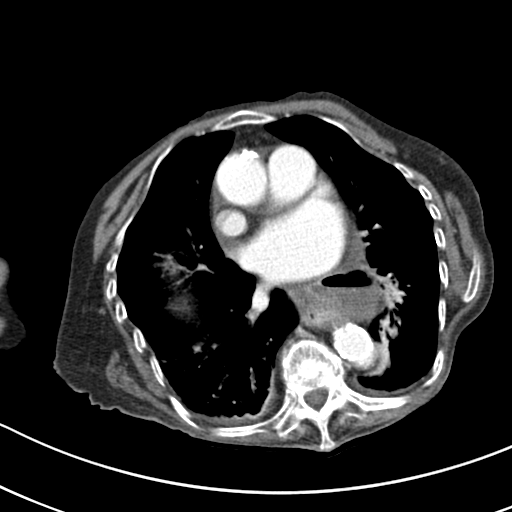

[Series 5: coronal a/|p · coronal · 0.62mm/px · 3 of 115 slices shown]
[im 39/115  soft-tissue]
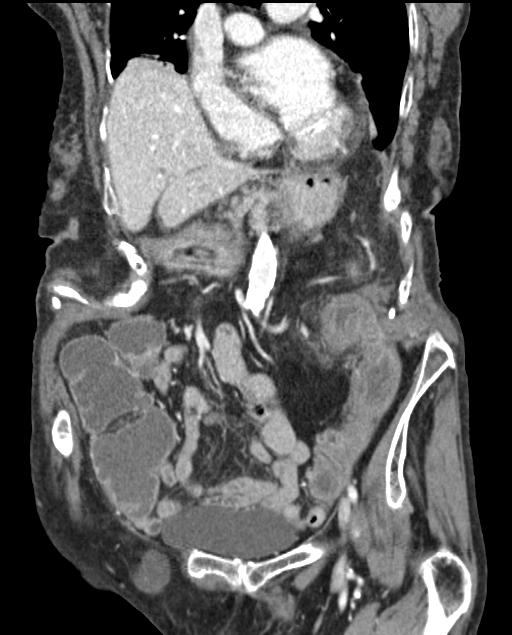
[im 51/115  soft-tissue]
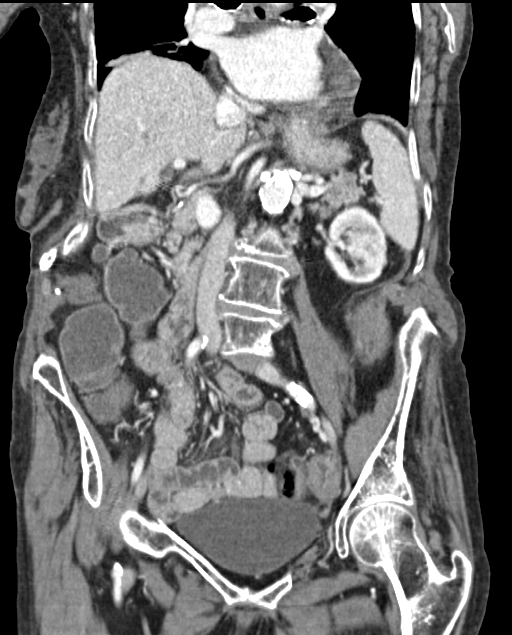
[im 64/115  soft-tissue]
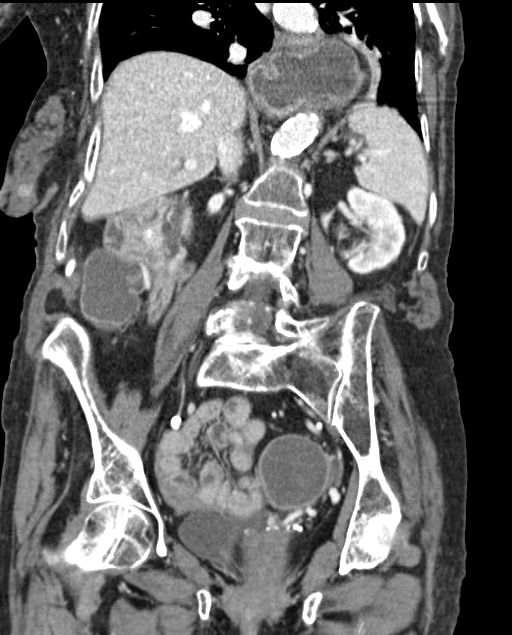

[15 of 46 positions shown; findings below may reference images not displayed]

FINDINGS: Lower chest: Sliding hiatal hernia, moderate size. Mild atelectasis
at the bases.

Hepatobiliary: No focal liver abnormality.No evidence of biliary
obstruction or stone.

Pancreas: Unremarkable.

Spleen: Unremarkable.

Adrenals/Urinary Tract: Negative adrenals. Solid left renal mass,
interpolar and extending into the hilum, 26 x 34 mm, slightly larger
than in 7309. No adenopathy or vascular invasive feature.
Unremarkable bladder.

Stomach/Bowel: There is a segment of colonic wall thickening from
submucosal edema, mainly from the distal transverse to sigmoid
colon. The colon is diffusely fluid-filled. Colonic diverticulosis
without active inflammation. Abrupt transition from decompressed
thickened colon to distended sigmoid colon, without discrete mass
lesion noted. There is extensive atherosclerosis of the aorta and
branch vessels. Quantification of ostial stenosis is not possible on
this study. No major branch occlusion is noted. The IMA is patent.
Negative for small bowel obstruction.

Vascular/Lymphatic: Diffuse atherosclerotic calcification as noted
above. No acute vascular finding. No mass or adenopathy.

Reproductive:Hysterectomy.  Negative adnexae.

Other: Left inguinal hernia containing nonobstructed or inflamed
small bowel. There is a right groin hernia containing small bowel,
possibly a postoperative hernia given neighboring clips. Additional
hernia in the right groin containing ascitic density material.
Hernias are progressed from prior.

Musculoskeletal: Compression fractures of T8, T9, T10, T12, L1, L5.
The lumbar spine was seen previously from L5-T11, and only the L5
fracture was present at that time. Height loss is advanced at
although fractures except at T9, where there is a visible fracture
plane and sclerosis, and L5. No retropulsion. Exaggerated
thoracolumbar curvature.
IMPRESSION: 1. Colitis centered on the splenic flexure; infectious or ischemic
colitis could give this appearance. There is extensive
atherosclerosis without major branch occlusion.
2. Known solid left renal mass/neoplasm with minimal growth since
[DATE] x 34 mm today.
3. Compression fractures of T8, T9, T10, T12, L1, and L5. Height
loss is advanced at multiple levels. The T9 fracture appears
subacute and unhealed.
4. Bilateral groin hernias containing nonobstructed small bowel.
